# Patient Record
Sex: Male | Born: 1955 | Race: White | Hispanic: No | Marital: Married | Smoking: Current every day smoker
Health system: Southern US, Community
[De-identification: ages and names within clinical notes are randomized; demographics above are authoritative.]

## PROBLEM LIST (undated history)

## (undated) DIAGNOSIS — M79609 Pain in unspecified limb: Secondary | ICD-10-CM

## (undated) DIAGNOSIS — Z8719 Personal history of other diseases of the digestive system: Secondary | ICD-10-CM

## (undated) DIAGNOSIS — T7840XA Allergy, unspecified, initial encounter: Secondary | ICD-10-CM

## (undated) DIAGNOSIS — J309 Allergic rhinitis, unspecified: Secondary | ICD-10-CM

## (undated) DIAGNOSIS — M1990 Unspecified osteoarthritis, unspecified site: Secondary | ICD-10-CM

## (undated) DIAGNOSIS — E119 Type 2 diabetes mellitus without complications: Secondary | ICD-10-CM

## (undated) DIAGNOSIS — F17200 Nicotine dependence, unspecified, uncomplicated: Secondary | ICD-10-CM

## (undated) DIAGNOSIS — K219 Gastro-esophageal reflux disease without esophagitis: Secondary | ICD-10-CM

## (undated) DIAGNOSIS — J329 Chronic sinusitis, unspecified: Secondary | ICD-10-CM

## (undated) DIAGNOSIS — C61 Malignant neoplasm of prostate: Secondary | ICD-10-CM

## (undated) HISTORY — PX: FINGER SURGERY: SHX640

## (undated) HISTORY — DX: Type 2 diabetes mellitus without complications: E11.9

## (undated) HISTORY — PX: SPINE SURGERY: SHX786

## (undated) HISTORY — DX: Allergy, unspecified, initial encounter: T78.40XA

## (undated) HISTORY — DX: Nicotine dependence, unspecified, uncomplicated: F17.200

## (undated) HISTORY — PX: PROSTATE BIOPSY: SHX241

## (undated) HISTORY — PX: COLONOSCOPY: SHX174

## (undated) HISTORY — DX: Chronic sinusitis, unspecified: J32.9

## (undated) HISTORY — DX: Pain in unspecified limb: M79.609

## (undated) HISTORY — PX: STOMACH SURGERY: SHX791

## (undated) HISTORY — DX: Gastro-esophageal reflux disease without esophagitis: K21.9

## (undated) HISTORY — DX: Allergic rhinitis, unspecified: J30.9

## (undated) HISTORY — DX: Unspecified osteoarthritis, unspecified site: M19.90

## (undated) HISTORY — DX: Personal history of other diseases of the digestive system: Z87.19

---

## 2003-07-12 LAB — HM COLONOSCOPY

## 2009-05-20 ENCOUNTER — Ambulatory Visit: Payer: Self-pay | Admitting: Internal Medicine

## 2009-05-20 DIAGNOSIS — Z8719 Personal history of other diseases of the digestive system: Secondary | ICD-10-CM | POA: Insufficient documentation

## 2009-05-20 DIAGNOSIS — J309 Allergic rhinitis, unspecified: Secondary | ICD-10-CM

## 2009-05-20 DIAGNOSIS — Z860100 Personal history of colon polyps, unspecified: Secondary | ICD-10-CM

## 2009-05-20 DIAGNOSIS — K5732 Diverticulitis of large intestine without perforation or abscess without bleeding: Secondary | ICD-10-CM

## 2009-05-20 DIAGNOSIS — M79609 Pain in unspecified limb: Secondary | ICD-10-CM | POA: Insufficient documentation

## 2009-05-20 DIAGNOSIS — J329 Chronic sinusitis, unspecified: Secondary | ICD-10-CM | POA: Insufficient documentation

## 2009-05-20 DIAGNOSIS — F17200 Nicotine dependence, unspecified, uncomplicated: Secondary | ICD-10-CM | POA: Insufficient documentation

## 2009-05-20 DIAGNOSIS — K219 Gastro-esophageal reflux disease without esophagitis: Secondary | ICD-10-CM | POA: Insufficient documentation

## 2009-05-20 HISTORY — DX: Allergic rhinitis, unspecified: J30.9

## 2009-05-20 HISTORY — DX: Personal history of colonic polyps: Z86.010

## 2009-05-20 HISTORY — DX: Personal history of colon polyps, unspecified: Z86.0100

## 2009-05-20 HISTORY — DX: Diverticulitis of large intestine without perforation or abscess without bleeding: K57.32

## 2009-05-20 HISTORY — DX: Chronic sinusitis, unspecified: J32.9

## 2009-06-17 ENCOUNTER — Ambulatory Visit: Payer: Self-pay | Admitting: Internal Medicine

## 2009-07-14 ENCOUNTER — Ambulatory Visit: Payer: Self-pay | Admitting: Cardiovascular Disease

## 2009-07-16 ENCOUNTER — Telehealth: Payer: Self-pay | Admitting: Internal Medicine

## 2009-07-27 ENCOUNTER — Encounter: Payer: Self-pay | Admitting: Internal Medicine

## 2009-08-04 ENCOUNTER — Encounter (INDEPENDENT_AMBULATORY_CARE_PROVIDER_SITE_OTHER): Payer: Self-pay | Admitting: *Deleted

## 2009-09-07 ENCOUNTER — Encounter (INDEPENDENT_AMBULATORY_CARE_PROVIDER_SITE_OTHER): Payer: Self-pay | Admitting: *Deleted

## 2009-09-09 ENCOUNTER — Ambulatory Visit: Payer: Self-pay | Admitting: Gastroenterology

## 2009-09-23 ENCOUNTER — Ambulatory Visit: Payer: Self-pay | Admitting: Gastroenterology

## 2009-09-25 ENCOUNTER — Encounter: Payer: Self-pay | Admitting: Gastroenterology

## 2010-08-08 LAB — CONVERTED CEMR LAB
ALT: 18 U/L
ALT: 32 U/L
AST: 18 U/L
AST: 24 U/L
Albumin: 4 g/dL
Albumin: 4.5 g/dL
Alkaline Phosphatase: 47 U/L
Alkaline Phosphatase: 59 U/L
BUN: 11 mg/dL
BUN: 19 mg/dL
Basophils Absolute: 0 K/uL
Basophils Relative: 0.4 %
Basophils Relative: 0.6 %
Bilirubin Urine: NEGATIVE
Bilirubin Urine: NEGATIVE
Bilirubin, Direct: 0.2 mg/dL
CO2: 30 meq/L
CO2: 30 meq/L
Calcium: 8.9 mg/dL
Calcium: 9.3 mg/dL
Chloride: 101 meq/L
Chloride: 106 meq/L
Cholesterol: 164 mg/dL
Creatinine, Ser: 1 mg/dL
Creatinine, Ser: 1 mg/dL
Eosinophils Absolute: 0.1 K/uL
Eosinophils Relative: 0.2 %
Eosinophils Relative: 1.3 %
GFR calc non Af Amer: 83.08 mL/min
Glucose, Bld: 114 mg/dL — ABNORMAL HIGH
Glucose, Bld: 130 mg/dL
Glucose, Urine, Semiquant: NEGATIVE
HCT: 35.4 %
HCT: 42.1 %
HDL: 28.2 mg/dL — ABNORMAL LOW
Hemoglobin, Urine: NEGATIVE
Hemoglobin: 11.5 g/dL
Hemoglobin: 13.2 g/dL
Ketones, ur: NEGATIVE mg/dL
Ketones, urine, test strip: NEGATIVE
LDL Cholesterol: 107 mg/dL — ABNORMAL HIGH
Leukocytes, UA: NEGATIVE
Lymphocytes Relative: 29.8 %
Lymphocytes, automated: 16.1 %
Lymphs Abs: 2 K/uL
MCHC: 31.4 g/dL
MCV: 61.9 fL
MCV: 65.6 fL — ABNORMAL LOW
Monocytes Absolute: 0.5 K/uL
Monocytes Relative: 7.1 %
Monocytes Relative: 7.2 %
Neutro Abs: 4.2 K/uL
Neutrophils Relative %: 61.2 %
Neutrophils Relative %: 76.1 %
Nitrite: NEGATIVE
Nitrite: NEGATIVE
PSA: 1.07 ng/mL
Platelets: 192 K/uL
Platelets: 209 K/uL
Potassium: 3.8 meq/L
Potassium: 4.4 meq/L
Protein, U semiquant: 30
RBC: 5.72 M/uL
RBC: 6.42 M/uL — ABNORMAL HIGH
RDW: 15.4 % — ABNORMAL HIGH
RDW: 16 %
Sodium: 139 meq/L
Sodium: 139 meq/L
Specific Gravity, Urine: 1.015
Specific Gravity, Urine: 1.025
TSH: 0.68 u[IU]/mL
Total Bilirubin: 0.9 mg/dL
Total Bilirubin: 1 mg/dL
Total CHOL/HDL Ratio: 6
Total Protein, Urine: NEGATIVE mg/dL
Total Protein: 6.4 g/dL
Total Protein: 7.5 g/dL
Triglycerides: 144 mg/dL
Urine Glucose: NEGATIVE mg/dL
Urobilinogen, UA: 0.2
Urobilinogen, UA: 1
VLDL: 28.8 mg/dL
WBC: 6.5 10*3/microliter
WBC: 6.8 10*3/microliter
pH: 5
pH: 7.5

## 2010-08-10 NOTE — Miscellaneous (Signed)
Summary: [facility]  Clinical Lists Changes  Medications: Added new medication of MOVIPREP 100 GM  SOLR (PEG-KCL-NACL-NASULF-NA ASC-C) As per prep instructions. - Signed Rx of MOVIPREP 100 GM  SOLR (PEG-KCL-NACL-NASULF-NA ASC-C) As per prep instructions.;  #1 x 0;  Signed;  Entered by: Ezra Sites RN;  Authorized by: Rachael Fee MD;  Method used: Electronically to [facility] [address], [place], [place], [place]  [address], Ph: [phone], Fax: [phone] Observations: Added new observation of NKA: T (09/09/2009 8:17)    Prescriptions: MOVIPREP 100 GM  SOLR (PEG-KCL-NACL-NASULF-NA ASC-C) As per prep instructions.  #1 x 0   Entered by:   Ezra Sites RN   Authorized by:   Rachael Fee MD   Signed by:   Ezra Sites RN on 09/09/2009   Method used:   Electronically to        [facility] (retail)       [address]       [place], [place]  [address]       Ph: [phone]       Fax: [phone]   RxID:   [id number]

## 2010-08-10 NOTE — Letter (Signed)
Summary: Results Letter   Gastroenterology  [address] [place], [place] [address]   Phone: [phone]  Fax: [phone]        September 25, 2009 MRN: [id number]    AUTHUR CUBIT [address] [place], [place]  [address]    Dear Mr. Sofranko,   Two of the polyps (out of three) removed during your recent procedure was proven to be adenomatous.  These are pre-cancerous polyps that may have grown into cancers if they had not been removed.  Based on current nationally recognized surveillance guidelines, I recommend that you have a repeat colonoscopy in 5 years.  We will therefore put your information in our reminder system and will contact you in 5 years to schedule a repeat procedure.  Please call if you have any questions or concerns.        Sincerely,  Rachael Fee MD  This letter has been electronically signed by your physician.  Appended Document: Results Letter Letter mailed 3.21.11

## 2010-08-10 NOTE — Progress Notes (Signed)
  Phone Note Call from Patient Call back at Home Phone [phone]   Caller: Patient Call For: Newt Lukes MD Summary of Call: Pt requesting medicine for draining sinuses.Please advise. Initial call taken by: Verdell Face,  July 16, 2009 8:54 AM  Follow-up for Phone Call        per MD, pt advised to take Benadryl with Claritin and call back tomorrow if no improvement Follow-up by: Margaret Pyle, CMA,  July 16, 2009 9:21 AM

## 2010-08-10 NOTE — Procedures (Signed)
Summary: Colonoscopy  Patient: [name] Note: All result statuses are Final unless otherwise noted.  Tests: (1) Colonoscopy (COL)   COL Colonoscopy           DONE     [facility]     [address]     [place], [place]  [address]           COLONOSCOPY PROCEDURE REPORT           PATIENT:  Alec [name], Alec [name]  MR#:  [id number]     BIRTHDATE:  Jan 11, 1956, 53 yrs. old  GENDER:  male           ENDOSCOPIST:  Rachael Fee, MD     Referred by:  Rene Paci, M.D.           PROCEDURE DATE:  09/23/2009     PROCEDURE:  Colonoscopy with snare polypectomy     ASA CLASS:  Class II     INDICATIONS:  previous polyps (unclear pathology) on colonoscopy     5-6 years ago, screening           MEDICATIONS:   Fentanyl 100 mcg IV, Versed 10 mg IV           DESCRIPTION OF PROCEDURE:   After the risks benefits and     alternatives of the procedure were thoroughly explained, informed     consent was obtained.  Digital rectal exam was performed and     revealed no rectal masses.   The LB CF-[id number] endoscope     was introduced through the anus and advanced to the cecum, which     was identified by both the appendix and ileocecal valve, without     limitations.  The quality of the prep was excellent, using     MoviPrep.  The instrument was then slowly withdrawn as the colon     was fully examined.     <<PROCEDUREIMAGES>>           FINDINGS:  A total of three small, sessile polyps were found,     removed with cold snare and sent to pathology (jar 1). Two were     located in ascending colon, one was located in sigmoid. All were     2-24mm across (see image4 and image5).  Moderate diverticulosis was     found sigmoid to descending colon segments. There was associated     mucosal edema and thickening (see image1).  This was otherwise a     normal examination of the colon (see image2, image3, and image6).     Retroflexed views in the rectum revealed no abnormalities.    The    scope was then withdrawn from the patient and the procedure     completed.           COMPLICATIONS:  None           ENDOSCOPIC IMPRESSION:     1) Three small polyps, all removed and sent to pathology     2) Moderate diverticulosis in the sigmoid to descending colon     segments     3) Otherwise normal examination     RECOMMENDATIONS:     1) If the polyp(s) removed today are proven to be adenomatous     (pre-cancerous) polyps, you will need a colonoscopy in 3-5 years.     Otherwise you should continue to follow colorectal cancer     screening guidelines for "routine risk" patients with a  colonoscopy in 10 years.     2) You will receive a letter within 1-2 weeks with the results     of your biopsy as well as final recommendations. Please call my     office if you have not received a letter after 3 weeks.           REPEAT EXAM:  await pathology           ______________________________     Rachael Fee, MD           n.     eSIGNED:   Rachael Fee at 09/23/2009 08:52 AM           Valora Piccolo, [id number]  Note: An exclamation mark (!) indicates a result that was not dispersed into the flowsheet. Document Creation Date: 09/23/2009 8:52 AM _______________________________________________________________________  (1) Order result status: Final Collection or observation date-time: 09/23/2009 08:42 Requested date-time:  Receipt date-time:  Reported date-time:  Referring Physician:   Ordering Physician: Rob Bunting [phone]) Specimen Source:  Source: Launa Grill Order Number: [id number] Lab site:   Appended Document: Colonoscopy     Procedures Next Due Date:    Colonoscopy: 09/2014

## 2010-08-10 NOTE — Letter (Signed)
Summary: [facility] Instructions  [facility]  [address] [place], [place] [address]   Phone: [phone]  Fax: [phone]       WAYBURN SHALER    01/07/1956    MRN: [id number]        Procedure Day Dorna Bloom: Wednesday 09/23/2009     Arrival Time: 7:30 am      Procedure Time: 8:30 am     Location of Procedure:                    _x _  [facility] (4th Floor)                        PREPARATION FOR COLONOSCOPY WITH MOVIPREP   Starting 5 days prior to your procedure Friday 3/11 do not eat nuts, seeds, popcorn, corn, beans, peas,  salads, or any raw vegetables.  Do not take any fiber supplements (e.g. Metamucil, Citrucel, and Benefiber).  THE DAY BEFORE YOUR PROCEDURE         DATE: Tuesday 3/15  1.  Drink clear liquids the entire day-NO SOLID FOOD  2.  Do not drink anything colored red or purple.  Avoid juices with pulp.  No orange juice.  3.  Drink at least 64 oz. (8 glasses) of fluid/clear liquids during the day to prevent dehydration and help the prep work efficiently.  CLEAR LIQUIDS INCLUDE: Water Jello Ice Popsicles Tea (sugar ok, no milk/cream) Powdered fruit flavored drinks Coffee (sugar ok, no milk/cream) Gatorade Juice: apple, white grape, white cranberry  Lemonade Clear bullion, consomm, broth Carbonated beverages (any kind) Strained chicken noodle soup Hard Candy                             4.  In the morning, mix first dose of MoviPrep solution:    Empty 1 Pouch A and 1 Pouch B into the disposable container    Add lukewarm drinking water to the top line of the container. Mix to dissolve    Refrigerate (mixed solution should be used within 24 hrs)  5.  Begin drinking the prep at 5:00 p.m. The MoviPrep container is divided by 4 marks.   Every 15 minutes drink the solution down to the next mark (approximately 8 oz) until the full liter is complete.   6.  Follow completed prep with 16 oz of clear liquid of your choice  (Nothing red or purple).  Continue to drink clear liquids until bedtime.  7.  Before going to bed, mix second dose of MoviPrep solution:    Empty 1 Pouch A and 1 Pouch B into the disposable container    Add lukewarm drinking water to the top line of the container. Mix to dissolve    Refrigerate  THE DAY OF YOUR PROCEDURE      DATE: Wednesday 3/16  Beginning at 3:30 a.m. (5 hours before procedure):         1. Every 15 minutes, drink the solution down to the next mark (approx 8 oz) until the full liter is complete.  2. Follow completed prep with 16 oz. of clear liquid of your choice.    3. You may drink clear liquids until 6:30 am (2 HOURS BEFORE PROCEDURE).   MEDICATION INSTRUCTIONS  Unless otherwise instructed, you should take regular prescription medications with a small sip of water   as early as possible the morning of your  procedure.           OTHER INSTRUCTIONS  You will need a responsible adult at least 55 years of age to accompany you and drive you home.   This person must remain in the waiting room during your procedure.  Wear loose fitting clothing that is easily removed.  Leave jewelry and other valuables at home.  However, you may wish to bring a book to read or  an iPod/MP3 player to listen to music as you wait for your procedure to start.  Remove all body piercing jewelry and leave at home.  Total time from sign-in until discharge is approximately 2-3 hours.  You should go home directly after your procedure and rest.  You can resume normal activities the  day after your procedure.  The day of your procedure you should not:   Drive   Make legal decisions   Operate machinery   Drink alcohol   Return to work  You will receive specific instructions about eating, activities and medications before you leave.    The above instructions have been reviewed and explained to me by   Ezra Sites RN  September 09, 2009 8:43 AM     I fully understand  and can verbalize these instructions _____________________________ Date _________

## 2010-08-10 NOTE — Letter (Signed)
Summary: Previsit letter  [facility]  [address] [place], [place] [address]   Phone: [phone]  Fax: [phone]       08/04/2009 MRN: [id number]  Alec [name] [address] [place], [place]  [address]  Dear Mr. Karl,  Welcome to the Gastroenterology Division at [facility].    You are scheduled to see a nurse for your pre-procedure visit on September 09, 2009 at 8:30am on the 3rd floor at [facility], [address]  We ask that you try to arrive at our office 15 minutes prior to your appointment time to allow for check-in.  Your nurse visit will consist of discussing your medical and surgical history, your immediate family medical history, and your medications.    Please bring a complete list of all your medications or, if you prefer, bring the medication bottles and we will list them.  We will need to be aware of both prescribed and over the counter drugs.  We will need to know exact dosage information as well.  If you are on blood thinners (Coumadin, Plavix, Aggrenox, Ticlid, etc.) please call our office today/prior to your appointment, as we need to consult with your physician about holding your medication.   Please be prepared to read and sign documents such as consent forms, a financial agreement, and acknowledgement forms.  If necessary, and with your consent, a friend or relative is welcome to sit-in on the nurse visit with you.  Please bring your insurance card so that we may make a copy of it.  If your insurance requires a referral to see a specialist, please bring your referral form from your primary care physician.  No co-pay is required for this nurse visit.     If you cannot keep your appointment, please call [phone] to cancel or reschedule prior to your appointment date.  This allows [place] the opportunity to schedule an appointment for another patient in need of care.    Thank you for choosing [facility] for  your medical needs.  We appreciate the opportunity to care for you.  Please visit [place] at our website  to learn more about our practice.                     Sincerely.                                                                                                                   The Gastroenterology Division

## 2010-08-10 NOTE — Assessment & Plan Note (Signed)
Summary: NEW [employer] PT--L LEG PAIN-PKG/OFF---STC   Vital Signs:  Patient profile:   55 year old male Height:      70 inches (177.80 cm) Weight:      205.0 pounds (93.18 kg) BMI:     29.52 O2 Sat:      97 % Temp:     98.4 degrees F (36.89 degrees C) oral Pulse rate:   83 / minute BP sitting:   112 / 82  (left arm) Cuff size:   large  Vitals Entered By: Orlan Leavens (May 20, 2009 8:38 AM) CC: New patient Is Patient Diabetic? No Pain Assessment Patient in pain? no        Primary Care Provider:  Newt Lukes MD  CC:  New patient.  History of Present Illness: new pt here to est care - prev followed with dr. Sherril Croon in [place]  patient is here today for annual physical. Patient feels well today overall.   also here today with complaint of left leg numbness and ache . onset of symptoms was 6 mos ago, but worse 2-3 months ago. course has been gradual onset, inititally intermittent and now occurs in constant pattern. problem precipitated by prolonged periods of sitting/driving symptom characterized as "throbbing" or "numb tingling" symptom radiates from knee down to toes "all over lower leg" problem not associated with injury or back/hip/knee pain, no swelling . symptoms improved by rest/reclining such as sleep overnight. symptoms worsened with activity. no prior hx of same symptoms.   also with chronic nasal congestion and discharge (clear, thick) no sinus pain or HA - uses Vicks spray at bedtime which helps but ? other product/med   Preventive Screening-Counseling & Management  Alcohol-Tobacco     Alcohol drinks/day: 0     Alcohol Counseling: not indicated; patient does not drink     Smoking Status: current     Smoking Cessation Counseling: yes     Packs/Day: 1.0     Tobacco Counseling: to quit use of tobacco products  Caffeine-Diet-Exercise     Caffeine use/day: <1     Caffeine Counseling: not indicated; caffeine use is not excessive or problematic      Does Patient Exercise: yes     Exercise Counseling: not indicated; exercise is adequate     Depression Counseling: not indicated; screening negative for depression  Clinical Review Panels:  Prevention   Last Colonoscopy:  Done by Dr. Orlando Penner in [place] Results: Polyp (07/12/2003)  Immunizations   Last Tetanus Booster:  Historical (07/11/2001)  CBC   WBC:  6.5 (01/27/2007)   RBC:  5.72 (01/27/2007)   Hgb:  11.5 (01/27/2007)   Hct:  35.4 (01/27/2007)   Platelets:  192 (01/27/2007)   MCV  61.9 (01/27/2007)   RDW  16.0 (01/27/2007)   PMN:  76.1 (01/27/2007)   Monos:  7.2 (01/27/2007)   Eosinophils:  0.2 (01/27/2007)   Basophil:  0.4 (01/27/2007)  Complete Metabolic Panel   Glucose:  130 (01/27/2007)   Sodium:  139 (01/27/2007)   Potassium:  3.8 (01/27/2007)   Chloride:  106 (01/27/2007)   CO2:  30 (01/27/2007)   BUN:  19 (01/27/2007)   Creatinine:  1.00 (01/27/2007)   Albumin:  4.0 (01/27/2007)   Total Protein:  6.4 (01/27/2007)   Calcium:  8.9 (01/27/2007)   Total Bili:  0.9 (01/27/2007)   Alk Phos:  47 (01/27/2007)   SGPT (ALT):  18 (01/27/2007)   SGOT (AST):  18 (01/27/2007)   -  Date:  01/27/2007  BG Random: 130    BUN: 19    Creatinine: 1.00    Sodium: 139    Potassium: 3.8    Chloride: 106    CO2 Total: 30    SGOT (AST): 18    SGPT (ALT): 18    T. Bilirubin: 0.9    Alk Phos: 47    Calcium: 8.9    Total Protein: 6.4    Albumin: 4.0    WBC: 6.5    HGB: 11.5    HCT: 35.4    RBC: 5.72    PLT: 192    MCV: 61.9    RDW: 16.0    Neutrophil: 76.1    Lymphs: 16.1    Monos: 7.2    Eos: 0.2    Basophil: 0.4    Specific Gravity: 1.015    Appearance: clear    Nitrite: neg    PH: 7.5    Protein: 30    Glucose: neg    Ketones: neg    Urobilinogen: 1.0    Bilirubin: neg  Current Medications (verified): 1)  Zyrtec-D Allergy & Congestion 5-120 Mg Xr12h-Tab (Cetirizine-Pseudoephedrine) .... Take 1 By Mouth Once Daily As Needed  2)  Tylenol Extra Strength 500 Mg Tabs (Acetaminophen) .... Take Prn  Allergies (verified): No Known Drug Allergies  Past History:  Past Medical History: Diverticulitis, hx of Allergic rhinitis Colonic polyps, hx of GERD anemia, hx of  Past Surgical History: none  Family History: Family History of Alcoholism/Addiction (brother) Heart disease & Parkinson disease (father) Alzheimer disease (mother)  Social History: Current Smoker no alcohol use married, lives with wife (who works at [employer]) works as Ecologist Status:  current Caffeine use/day:  <1 Does Patient Exercise:  yes Packs/Day:  1.0  Review of Systems       see HPI above. I have reviewed all other systems and they were negative.   Physical Exam  General:  alert, well-developed, well-nourished, and cooperative to examination.    Eyes:  vision grossly intact; pupils equal, round and reactive to light.  conjunctiva and lids normal.   Ears:  normal pinnae bilaterally, without erythema, swelling, or tenderness to palpation. TMs clear, without effusion, or cerumen impaction. Hearing grossly normal bilaterally  Nose:  no external deformity, no nasal discharge, and mucosal erythema.   Mouth:  teeth and gums in good repair; mucous membranes moist, without lesions or ulcers. oropharynx clear without exudate, + erythema.  Lungs:  normal respiratory effort, no intercostal retractions or use of accessory muscles; normal breath sounds bilaterally - no crackles and no wheezes.    Heart:  normal rate, regular rhythm, no murmur, and no rub. BLE without edema. normal DP pulses and normal cap refill in all 4 extremities    Abdomen:  soft, non-tender, normal bowel sounds, no distention; no masses and no appreciable hepatomegaly or splenomegaly.   Genitalia:  defer  Msk:  back: full range of motion of lumbar spine. Nontender to palpation. Negative straight leg raise. Deep tendon reflexes symmetrically intact at Achilles and patella, negative clonus. Sensation intact throughout all dermatomes in bilateral lower extremities. Full strength to manual muscle testing in all major muscule groups including EHL, anterior tibialis, gastrocnemius, quadriceps, and iliopsoas. Able to heel and toe walk without difficulty and ambulates with a normal gait.  Neurologic:  alert & oriented X3 and cranial nerves II-XII symetrically intact.  strength normal in all extremities, sensation intact to light touch, and gait normal. speech fluent without dysarthria or  aphasia; follows commands with good comprehension.  Skin:  no rashes, vesicles, ulcers, or erythema. No nodules or irregularity to palpation.  Psych:  Oriented X3, memory intact for recent and remote, normally interactive, good eye contact, not anxious appearing, not depressed appearing, and not agitated.      Impression & Recommendations:  Problem # 1:  PREVENTIVE HEALTH CARE (ICD-V70.0)  Patient has been counseled on age-appropriate routine health concerns for screening and prevention. These are reviewed and up-to-date. Immunizations are up-to-date or declined. Labs and ECG reviewed.   Orders: TLB-Lipid Panel (80061-LIPID) TLB-BMP (Basic Metabolic Panel-BMET) (80048-METABOL) TLB-CBC Platelet - w/Differential (85025-CBCD) TLB-Hepatic/Liver Function Pnl (80076-HEPATIC) TLB-TSH (Thyroid Stimulating Hormone) (84443-TSH) TLB-PSA (Prostate Specific Antigen) (84153-PSA) TLB-Udip ONLY (81003-UDIP) EKG w/ Interpretation (93000) Gastroenterology Referral (GI)  Problem # 2:  LEG PAIN, LEFT (ICD-729.5) suspect coming from back - but exam benign check xray l-spine and tx with 6d pred pack -  f/u if unimproved and consider MRI or other referral as needed  Orders: T-Lumbar Spine Complete, 5 Views (71110TC)   Problem # 3:  SINUSITIS, CHRONIC (ICD-473.9) add nasal steroids and stop smoking!  His updated medication list for this problem includes:    Zyrtec-d Allergy & Congestion 5-120 Mg Xr12h-tab (Cetirizine-pseudoephedrine) .Marland Kitchen... Take 1 by mouth once daily as needed    Fluticasone Propionate 50 Mcg/act Susp (Fluticasone propionate) .Marland Kitchen... 1 spray each nostril  every morning  Problem # 4:  SMOKER (ICD-305.1) 5 minutes today spent on patient education regarding the unhealthy effects of continued tobacco abuse and encouragment of cessation including medical options available to help patient to quit smoking. already has chantix rx - encouraged its use!!!  Complete Medication List: 1)  Zyrtec-d Allergy & Congestion 5-120 Mg Xr12h-tab (Cetirizine-pseudoephedrine) .... Take 1 by mouth once daily as needed 2)  Tylenol Extra Strength 500 Mg Tabs (Acetaminophen) .... Take prn 3)  Prednisone (pak) 10 Mg Tabs (Prednisone) .... Take as directed x 6 days 4)  Fluticasone Propionate 50 Mcg/act Susp (Fluticasone propionate) .Marland Kitchen.. 1 spray each nostril  every morning  Patient Instructions: 1)  it was good to see you today.  2)  we have done a physical today - your exam and EKG are normal - 3)  labwork today - your results will be posted on the phone tree for review in next 48-72 hours.  4)  we'll make referral to GI for colonoscopy . Our office will contact you regarding this appointment once made.  5)  for your leg pain, we will check xray of low back to look for arthritis changes - we also will treat with pred pack for antinflammatory effect - let us know if this does not help your pain for further evaluation and testing. 6)  for your sinus problems, use flonase spray every morning - ok to still use Vicks or other nose spray at bedtime as needed  - also STOP smoking!  7)  Tobacco is very bad for your health and your loved ones! You Should stop smoking! use the chantix you have and call if refills are needed for continuing dose pack 8)  Please schedule a follow-up appointment in 4-6 weeks for your leg/back to monitor the progress of your sinus sy and smoking cessation, call sooner if problems.  Prescriptions: FLUTICASONE PROPIONATE 50 MCG/ACT SUSP (FLUTICASONE PROPIONATE) 1 spray each nostril  every morning  #1 x 3   Entered and Authorized by:   Newt Lukes MD   Signed by:   Newt Lukes MD  on 05/20/2009   Method used:   Electronically to        [facility] (retail)       [address]       [place], [place]  [address]       Ph: [phone]       Fax: [phone]   RxID:   [id number] PREDNISONE (PAK) 10 MG TABS (PREDNISONE) take as directed x 6 days  #1 x 0   Entered and Authorized by:   Newt Lukes MD   Signed by:   Newt Lukes MD on 05/20/2009   Method used:   Electronically to        [facility] (retail)       [address]       [place], [place]  [address]       Ph: [phone]       Fax: [phone]   RxID:   [id number]    Immunization History:  Tetanus/Td Immunization History:    Tetanus/Td:  historical (07/11/2001)    Colonoscopy  Procedure date:  07/12/2003  Findings:      Done by Dr. Orlando Penner in [place] Results: Polyp  Venous Doppler  Procedure date:  03/23/2009  Findings:      Done @ [facility] Impression: 1. No evidence of deep venous thombosis left lower extremity  CT of Abdomen  Procedure date:  04/24/2008  Findings:      CT Abdomen Finding: Solid organ have an unremarkable unenhanced appearance. No renal or proximal ureteral stones. No hydronephrosis. Gallbladder unremarkable.  Bowel grossly unremarkable. No free fluid, free air, or adenopathy.   Lung bases are clear. No effusion. No acute bony abnormality. Mild degenerative changes in the spine.  Impression: No acute finding in the abdomen  CT of Pelvis  Procedure date:  04/24/2008  Findings:      Finds: There is sigmoid diverticulosis. inflammatory stranding noted around portions of the sigmoid colon best seen on the coronal reconstructed images, compatible with mild changes of diverticulitis. no free air focal or fluid collection to suggest abscess.  Pelvic small bowel grossly unremarkable. Appendix is normal. Nofree fluid, free air, or adenopathy. Urinary bladder unremarkable. No acute abony abnormality.  Impression: Sigmoid diverticulosis with early changes of active diverticuilitis  Imaging Exam  Procedure date:  01/27/2007  Findings:      Rad/Acute Abdomen series Impression: 1. Nonspecific bowel gas pattern. No evidence of acute cardiopulmonary disease

## 2010-08-10 NOTE — Consult Note (Signed)
Summary: Sinus symptoms & chronic/[facility]  Sinus symptoms & chronic/[facility]   Imported By: Sherian Rein 07/29/2009 10:46:16  _____________________________________________________________________  External Attachment:    Type:   Image     Comment:   External Document

## 2011-04-04 ENCOUNTER — Ambulatory Visit (INDEPENDENT_AMBULATORY_CARE_PROVIDER_SITE_OTHER): Payer: 59 | Admitting: Internal Medicine

## 2011-04-04 ENCOUNTER — Encounter: Payer: Self-pay | Admitting: Internal Medicine

## 2011-04-04 DIAGNOSIS — R350 Frequency of micturition: Secondary | ICD-10-CM

## 2011-04-04 DIAGNOSIS — R109 Unspecified abdominal pain: Secondary | ICD-10-CM

## 2011-04-04 DIAGNOSIS — Z860100 Personal history of colon polyps, unspecified: Secondary | ICD-10-CM

## 2011-04-04 LAB — POCT URINALYSIS DIPSTICK
Bilirubin, UA: NEGATIVE
Glucose, UA: NEGATIVE
Ketones, UA: NEGATIVE
Nitrite, UA: NEGATIVE
Protein, UA: NEGATIVE
Spec Grav, UA: 1.002
Urobilinogen, UA: 0.2
pH, UA: 5

## 2011-04-04 MED ORDER — HYDROCODONE-ACETAMINOPHEN 5-500 MG PO TABS: 1.0000 | ORAL_TABLET | ORAL | Status: DC | PRN

## 2011-04-04 NOTE — Patient Instructions (Signed)
It was good to see you today. we'll make referral for CT scan to look for kidney stones. Our office will contact you regarding appointment(s) once made. Use vicodin as needed for pain and stay hydrated - Your prescription(s) have been submitted to your pharmacy. Please take as directed and contact our office if you believe you are having problem(s) with the medication(s). Schedule for your physical and labs as discussed in next few weeks-months

## 2011-04-04 NOTE — Progress Notes (Signed)
Subjective:    Patient ID: Alec [name], male    DOB: [birth year], 55 y.o.   MRN: [id number]  HPI complains of left flank pain Onset 3 days ago Pain raditates into buttock and posterior thigh Hx same on/off every few months for past 2 years, last flare 2 months ago and lasted x 2 weeks associated with increase urinary freq, esp nocturia No gross hematuria, no dysuria -  No falls, trauma or weakness in left leg Pain not imporved with tylenol, NSAIDs otc or heating pad Pain improved with standing - worse sitting or lying down  Past Medical History  Diagnosis Date  . ALLERGIC RHINITIS   . DIVERTICULITIS, HX OF   . GERD   . LEG PAIN, LEFT   . SINUSITIS, CHRONIC   . SMOKER      Review of Systems  Constitutional: Negative for fever and unexpected weight change.  Respiratory: Negative for cough and shortness of breath.   Cardiovascular: Negative for chest pain and palpitations.  Genitourinary: Positive for decreased urine volume. Negative for discharge and testicular pain.       Objective:   Physical Exam  BP 120/72  Pulse 68  Temp(Src) 97.8 F (36.6 C) (Oral)  Ht 5\' 7"  (1.702 m)  Wt 199 lb 6.4 oz (90.447 kg)  BMI 31.23 kg/m2  SpO2 98% Wt Readings from Last 3 Encounters:  04/04/11 199 lb 6.4 oz (90.447 kg)  06/17/09 206 lb (93.441 kg)  05/20/09 205 lb (92.987 kg)     Constitutional:  appears well-developed and well-nourished. No distress.  Neck: Normal range of motion. Neck supple. No JVD present. No thyromegaly present.  Cardiovascular: Normal rate, regular rhythm and normal heart sounds.  No murmur heard. no BLE edema Pulmonary/Chest: Effort normal and breath sounds normal. No respiratory distress. no wheezes.  Abdominal: Soft. Bowel sounds are normal. Patient exhibits no distension. There is no tenderness, no mass  Musculoskeletal: Back: full range of motion of thoracic and lumbar spine. Non tender to palpation. Negative straight leg raise. DTR's are  symmetrically intact. Sensation intact in all dermatomes of the lower extremities. Full strength to manual muscle testing and able to heel toe walk without difficulty and ambulates with normal gait.  Neurological: he is alert and oriented to person, place, and time. No cranial nerve deficit. Coordination/balance normal.  Skin: Skin is warm and dry.  No erythema or ulceration.  Psychiatric: he has a normal mood and affect. behavior is normal. Judgment and thought content normal.   Lab Results  Component Value Date   WBC 6.5 05/20/2009   WBC 6.8 05/20/2009   HGB 11.5 05/20/2009   HGB 13.2 05/20/2009   HCT 35.4 05/20/2009   HCT 42.1 05/20/2009   PLT 192 05/20/2009   PLT 209.0 05/20/2009   CHOL 164 05/20/2009   TRIG 144.0 05/20/2009   HDL 28.20* 05/20/2009   ALT 18 05/20/2009   ALT 32 05/20/2009   AST 18 05/20/2009   AST 24 05/20/2009   NA 139 05/20/2009   NA 139 05/20/2009   K 3.8 05/20/2009   K 4.4 05/20/2009   CL 106 05/20/2009   CL 101 05/20/2009   CREATININE 1.00 05/20/2009   CREATININE 1.0 05/20/2009   BUN 19 05/20/2009   BUN 11 05/20/2009   CO2 30 05/20/2009   CO2 30 05/20/2009   TSH 0.68 05/20/2009   PSA 1.07 05/20/2009   Udip today - mod blood, sm LE, no nitrate    Assessment & Plan:  Flank  pain, micro hematuria - ?kidney stone - check CT to eval for same and vicodin prn severe pain symptoms unrelieved with NSAIDs - if no evidence for kidney stone, consider MRI L-spine to look for SS or radiculopathy given intermittent recurrence of symptoms for last 2 years - prev relieved with pred pak in 2010

## 2011-04-06 ENCOUNTER — Ambulatory Visit (INDEPENDENT_AMBULATORY_CARE_PROVIDER_SITE_OTHER)
Admission: RE | Admit: 2011-04-06 | Discharge: 2011-04-06 | Disposition: A | Discharge status: Home / Self Care | Payer: 59 | Source: Ambulatory Visit | Attending: Internal Medicine | Admitting: Internal Medicine

## 2011-04-06 DIAGNOSIS — R350 Frequency of micturition: Secondary | ICD-10-CM

## 2011-04-06 DIAGNOSIS — R109 Unspecified abdominal pain: Secondary | ICD-10-CM

## 2011-04-13 ENCOUNTER — Telehealth: Payer: Self-pay

## 2011-04-13 MED ORDER — HYDROCORTISONE ACETATE 10 % RE FOAM: 1.0000 | Freq: Two times a day (BID) | RECTAL | Status: DC

## 2011-04-13 NOTE — Telephone Encounter (Signed)
Pt called stating he has been experiencing a flare of Hemorrhoids x 3 days. Pt is requesting Rx to local pharmacy, please advise, pt states he is having pain and discomfort with mild bleeding after using the restroom.

## 2011-04-13 NOTE — Telephone Encounter (Signed)
erx done

## 2011-04-13 NOTE — Telephone Encounter (Signed)
Pt informed of Rx and pharmacy 

## 2011-04-20 ENCOUNTER — Telehealth: Payer: Self-pay

## 2011-04-20 DIAGNOSIS — M5432 Sciatica, left side: Secondary | ICD-10-CM

## 2011-04-20 MED ORDER — PREDNISONE (PAK) 10 MG PO TABS: 10.0000 mg | ORAL_TABLET | ORAL | Status: AC

## 2011-04-20 NOTE — Telephone Encounter (Signed)
For treatment and eval of sciatica, erx for pred pak (has taken same with relief in past for other episodes) and will order MRI back to look for cause of sciatica - [facility] will arrange same and will call once results reviewed - thanks

## 2011-04-20 NOTE — Telephone Encounter (Signed)
Pt called stating he is experiencing LT side thigh pain, radiating down the entire leg. Pt does not believe pain is related to Kidney stone, per last OV. He is concerned that this could be related to Sciatic nerve, please advise?

## 2011-04-20 NOTE — Telephone Encounter (Signed)
Pt advised of Rx/pharmacy and MRI

## 2011-04-22 ENCOUNTER — Emergency Department (HOSPITAL_COMMUNITY)
Admission: EM | Admit: 2011-04-22 | Discharge: 2011-04-22 | Disposition: A | Discharge status: Home / Self Care | Payer: 59 | Attending: Emergency Medicine | Admitting: Emergency Medicine

## 2011-04-22 ENCOUNTER — Encounter (HOSPITAL_COMMUNITY): Payer: Self-pay | Admitting: *Deleted

## 2011-04-22 DIAGNOSIS — F17200 Nicotine dependence, unspecified, uncomplicated: Secondary | ICD-10-CM | POA: Insufficient documentation

## 2011-04-22 DIAGNOSIS — M79609 Pain in unspecified limb: Secondary | ICD-10-CM | POA: Insufficient documentation

## 2011-04-22 DIAGNOSIS — K219 Gastro-esophageal reflux disease without esophagitis: Secondary | ICD-10-CM | POA: Insufficient documentation

## 2011-04-22 DIAGNOSIS — M545 Low back pain, unspecified: Secondary | ICD-10-CM | POA: Insufficient documentation

## 2011-04-22 DIAGNOSIS — IMO0002 Reserved for concepts with insufficient information to code with codable children: Secondary | ICD-10-CM | POA: Insufficient documentation

## 2011-04-22 DIAGNOSIS — M5410 Radiculopathy, site unspecified: Secondary | ICD-10-CM

## 2011-04-22 MED ORDER — OXYCODONE-ACETAMINOPHEN 5-325 MG PO TABS: 1.0000 | ORAL_TABLET | ORAL | Status: AC | PRN

## 2011-04-22 NOTE — ED Notes (Signed)
Pt states intermittent kidney stone pain and pain to entire left leg. Dinies flank or back pain at this time. Pt states he recently seen a Chiropractor and was dx with a pinched nerve. Pt states pain was only to upper left leg and only had discomfort when sitting. Pt states now it is entire leg and hurts sitting or standing, but feels a little better while standing. Pt is scheduled for MRI on Monday.

## 2011-04-22 NOTE — ED Notes (Signed)
Pt c/o pain to left leg x 5 days; pt states he was diagnosed with a kidney stone x 2 weeks ago; pt is c/o severe pain when he sits down

## 2011-04-22 NOTE — ED Provider Notes (Signed)
History   This chart was scribed for Dr. Effie Shy by Clarita Crane. The patient was seen in room APA19/APA19 and the patient's care was started at 8:27AM.   CSN: [id number] Arrival date & time: 04/22/2011  7:31 AM  Chief Complaint  Patient presents with  . Left lower extremity pain    left leg     HPI Alec [name] is a 55 y.o. male who presents to the Emergency Department complaining of constant left thigh pain radiating to left calf onset 4 days ago and worsening since with associated lower back pain. Denies fever, cough, chest pain, SOB, n/v/d, numbness. Patient reports symptoms initially began 4 days ago after bending over to put a sock on with sudden onset of posterior left thigh pain but has gradually worsened since and began radiating to left calf yesterday. Patient reports current pain is aggravated by sitting and lying flat and mildly relieved by standing. Reports having significant relief while lying flat on stomach. Notes that he has experienced intermittent episodes of lower back pain with associated numbness and pain to left lower extremity for the past 4 to 5 years. Patient reports he was recently evaluated and treated by a chiropractor and has an MRI of his spine scheduled 3 days from today. Patient also reports he was recently diagnosed with a kidney stone confirmed by CT-abdomen performed 2 weeks ago. Patient is a current smoker. Currently on tapering dose of Prednisone.   Past Medical History  Diagnosis Date  . ALLERGIC RHINITIS   . DIVERTICULITIS, HX OF   . GERD   . LEG PAIN, LEFT   . SINUSITIS, CHRONIC   . SMOKER     History reviewed. No pertinent past surgical history.  Family History  Problem Relation Age of Onset  . Alzheimer's disease Mother   . Heart disease Father   . Parkinsonism Father   . Alcohol abuse Brother     History  Substance Use Topics  . Smoking status: Current Everyday Smoker  . Smokeless tobacco: Not on file   Comment: Married, lives  with wife (MM dx 2009)  . Alcohol Use: No      Review of Systems 10 Systems reviewed and are negative for acute change except as noted in the HPI.  Allergies  Review of patient's allergies indicates no known allergies.  Home Medications   Current Outpatient Rx  Name Route Sig Dispense Refill  . ACETAMINOPHEN 500 MG PO TABS Oral Take 500 mg by mouth as needed.      Marland Kitchen FLUTICASONE PROPIONATE 50 MCG/ACT NA SUSP Nasal Place 1 spray into the nose every morning.      Marland Kitchen HYDROCODONE-ACETAMINOPHEN 5-500 MG PO TABS Oral Take 1 tablet by mouth every 4 (four) hours as needed for pain. 20 tablet 0  . HYDROCORTISONE ACETATE 90 MG/DOSE RE FOAM Rectal Place 1 applicator rectally 2 (two) times daily. 15 g 0  . LORATADINE-PSEUDOEPHEDRINE 5-120 MG PO TB12 Oral Take 1 tablet by mouth every morning.      Marland Kitchen OMEPRAZOLE 20 MG PO CPDR Oral Take 20 mg by mouth daily.      . OXYCODONE-ACETAMINOPHEN 5-325 MG PO TABS Oral Take 1 tablet by mouth every 4 (four) hours as needed for pain. 20 tablet 0  . PREDNISONE (PAK) 10 MG PO TABS Oral Take 1 tablet (10 mg total) by mouth as directed. As directed x 6 days 21 tablet 0    BP 146/87  Pulse 89  Temp(Src) 98.1 F (36.7 C) (Oral)  Resp 20  Ht 5\' 10"  (1.778 m)  Wt 196 lb (88.905 kg)  BMI 28.12 kg/m2  SpO2 98%  Physical Exam  Nursing note and vitals reviewed. Constitutional: He is oriented to person, place, and time. He appears well-developed and well-nourished. No distress.  HENT:  Head: Normocephalic and atraumatic.  Mouth/Throat: Oropharynx is clear and moist.  Eyes: EOM are normal. Pupils are equal, round, and reactive to light.  Neck: Normal range of motion. Neck supple. No tracheal deviation present.  Cardiovascular: Normal rate, regular rhythm and normal heart sounds.  Exam reveals no gallop and no friction rub.   No murmur heard. Pulmonary/Chest: Effort normal and breath sounds normal. No respiratory distress. He has no wheezes. He has no rales. He  exhibits no tenderness.  Abdominal: Soft. He exhibits no distension. There is no tenderness. There is no CVA tenderness.  Musculoskeletal: Normal range of motion. He exhibits no edema.       Entire spine non-tender. No paraspinal tenderness. Negative straight leg raise to RLE. Mild positive straight leg raise on left at 45 degrees.   Neurological: He is alert and oriented to person, place, and time. He has normal reflexes. No sensory deficit.  Skin: Skin is warm and dry.  Psychiatric: He has a normal mood and affect. His behavior is normal.    ED Course  Procedures (including critical care time)  DIAGNOSTIC STUDIES: Oxygen Saturation is 98% on room air, normal by my interpretation.    COORDINATION OF CARE:    Labs Reviewed - No data to display No results found.   1. Radicular leg pain       MDM   Insidious onset of low back and left leg pain, currently under management as an outpatient, was scheduledadvanced imaging planned. No evidence for compressive myelopathy, occult infection, severe disability. Patient is stable for discharge with outpatient management.   I personally performed the services described in this documentation, which was scribed in my presence. The recorded information has been reviewed and considered. [name] L9:32 AM   Flint Melter, MD 04/22/11 [phone]

## 2011-04-23 ENCOUNTER — Ambulatory Visit
Admission: RE | Admit: 2011-04-23 | Discharge: 2011-04-23 | Disposition: A | Discharge status: Home / Self Care | Payer: 59 | Source: Ambulatory Visit | Attending: Internal Medicine | Admitting: Internal Medicine

## 2011-04-23 DIAGNOSIS — M5432 Sciatica, left side: Secondary | ICD-10-CM

## 2011-04-25 ENCOUNTER — Other Ambulatory Visit: Payer: 59

## 2011-04-25 ENCOUNTER — Other Ambulatory Visit: Payer: Self-pay | Admitting: Internal Medicine

## 2011-04-25 ENCOUNTER — Telehealth: Payer: Self-pay | Admitting: *Deleted

## 2011-04-25 DIAGNOSIS — M5416 Radiculopathy, lumbar region: Secondary | ICD-10-CM

## 2011-04-25 NOTE — Telephone Encounter (Signed)
Ok for NS referral - I will do

## 2011-04-25 NOTE — Telephone Encounter (Signed)
MRI does show possble pinched nerve  - if still with pain, he should consider seeing orthopedic or neurosurgury  Dahlia to d/w pt - does he have preference?

## 2011-04-25 NOTE — Progress Notes (Signed)
Addended by: Rene Paci A on: 04/25/2011 09:14 PM   Modules accepted: Orders

## 2011-04-25 NOTE — Telephone Encounter (Signed)
Patient requesting results of MRI.

## 2011-04-25 NOTE — Telephone Encounter (Signed)
Pt states that he is still having left pain and that he is having numbness in foot-pt does not have a preference

## 2011-05-09 ENCOUNTER — Telehealth: Payer: Self-pay | Admitting: *Deleted

## 2011-05-09 NOTE — Telephone Encounter (Signed)
Pt call stating trying to get appt schedule @ [facility]. There office been trying to get MRI that was done. Requesting MRI to be fax over. Fax MRI to [phone]...05/09/11@12 :10pm/LMB

## 2011-11-07 ENCOUNTER — Other Ambulatory Visit: Payer: Self-pay

## 2011-11-21 ENCOUNTER — Other Ambulatory Visit: Payer: Self-pay | Admitting: *Deleted

## 2011-11-21 MED ORDER — HYDROCORTISONE ACETATE 10 % RE FOAM: 1.0000 | Freq: Two times a day (BID) | RECTAL | Status: DC

## 2011-11-21 NOTE — Telephone Encounter (Signed)
proctocort - ok to send erx to any pharmacy near pt - thanks

## 2011-11-21 NOTE — Telephone Encounter (Signed)
Left msg on vm md rx something for hemorrhoids a year ago. Currently in [place]  hemorrhoids has flare back up requesting md to call rx into pharmacy... 5/13/2:pm/LMB

## 2011-11-21 NOTE — Telephone Encounter (Signed)
Notified pt md ok med want rx sent to [facility] in [place].... 11/21/11@3 :09pm/LMB

## 2011-11-22 ENCOUNTER — Other Ambulatory Visit: Payer: Self-pay

## 2011-11-22 ENCOUNTER — Telehealth: Payer: Self-pay | Admitting: *Deleted

## 2011-11-22 MED ORDER — HYDROCORTISONE ACE-PRAMOXINE 1-1 % RE FOAM: 1.0000 | Freq: Two times a day (BID) | RECTAL | Status: AC

## 2011-11-22 MED ORDER — HYDROCORTISONE ACETATE 10 % RE FOAM: 1.0000 | Freq: Two times a day (BID) | RECTAL | Status: DC

## 2011-11-22 NOTE — Telephone Encounter (Signed)
Pt called and stated [facility] didn't know what md sent in. Requesting [facility] [place]. [facility]form [facility] to [facility].... 11/22/11@9 :57am/LMB

## 2011-11-22 NOTE — Telephone Encounter (Signed)
Yes thanks 

## 2011-11-22 NOTE — Telephone Encounter (Signed)
Pharmacy called stating that they do recognize Rx sent for Proctofoam. Pharmacy is suggesting changing to Proctofoam %1 foam, apply rectally BID. Okay to change?

## 2011-11-28 ENCOUNTER — Telehealth: Payer: Self-pay | Admitting: *Deleted

## 2011-11-28 NOTE — Telephone Encounter (Signed)
Called pt to see if he pick rx up at [facility]. Pt states he did and doesn't need from [facility]. Called Kim back cx prescription that was sent in last week... 11/28/11@ 11:40am/LMB

## 2011-11-28 NOTE — Telephone Encounter (Signed)
Left msg on vm want to know if they can substitute proctoform rx for a generic.... 11/28/11@11 :32am/LMB

## 2012-02-13 ENCOUNTER — Encounter: Payer: Self-pay | Admitting: Internal Medicine

## 2012-02-13 ENCOUNTER — Ambulatory Visit (INDEPENDENT_AMBULATORY_CARE_PROVIDER_SITE_OTHER): Payer: 59 | Admitting: Internal Medicine

## 2012-02-13 VITALS — BP 130/70 | HR 74 | Temp 97.6°F | Ht 67.0 in | Wt 197.0 lb

## 2012-02-13 DIAGNOSIS — F17200 Nicotine dependence, unspecified, uncomplicated: Secondary | ICD-10-CM

## 2012-02-13 DIAGNOSIS — M79609 Pain in unspecified limb: Secondary | ICD-10-CM

## 2012-02-13 DIAGNOSIS — R197 Diarrhea, unspecified: Secondary | ICD-10-CM

## 2012-02-13 DIAGNOSIS — K5732 Diverticulitis of large intestine without perforation or abscess without bleeding: Secondary | ICD-10-CM

## 2012-02-13 DIAGNOSIS — K5792 Diverticulitis of intestine, part unspecified, without perforation or abscess without bleeding: Secondary | ICD-10-CM

## 2012-02-13 MED ORDER — CIPROFLOXACIN HCL 500 MG PO TABS: 500.0000 mg | ORAL_TABLET | Freq: Two times a day (BID) | ORAL | Status: AC

## 2012-02-13 MED ORDER — METRONIDAZOLE 500 MG PO TABS: 500.0000 mg | ORAL_TABLET | Freq: Three times a day (TID) | ORAL | Status: AC

## 2012-02-13 NOTE — Assessment & Plan Note (Signed)
5 minutes today spent counseling patient on unhealthy effects of continued tobacco abuse and encouragement of cessation including medical options available to help the patient quit smoking. Declined chantix 2012 due to fear of side effects - encouraged investigation into e-cig

## 2012-02-13 NOTE — Assessment & Plan Note (Signed)
S/p intervention by NSurg (kritzer) 05/2011 - 100% resolved Reviewed interval hx and preop MRI with pt today

## 2012-02-13 NOTE — Patient Instructions (Addendum)
It was good to see you today. Cipro and Flagyl antibiotics for diverticulitis infection - Your prescription(s) have been submitted to your pharmacy. Please take as directed and contact our office if you believe you are having problem(s) with the medication(s). Continue to think about giving up cigarettes! Use nicotine gum, nicotine patches or electronic cigarettes. If you're interested in medication to help you quit, please call  - let me know how I can help! Please schedule followup in 3-6 months for review, call sooner if problems.

## 2012-02-13 NOTE — Progress Notes (Signed)
  Subjective:    Patient ID: Alec [name], male    DOB: Jun 15, 1956, 56 y.o.   MRN: [id number]  Diarrhea  This is a new problem. The current episode started in the past 7 days. The problem has been gradually worsening. The stool consistency is described as watery. The patient states that diarrhea awakens him from sleep. Associated symptoms include abdominal pain. Pertinent negatives include no bloating, coughing, fever, headaches, vomiting or weight loss. Nothing aggravates the symptoms. He has tried anti-motility drug for the symptoms. The treatment provided mild relief. There is no history of bowel resection, inflammatory bowel disease, malabsorption or a recent abdominal surgery.   Past Medical History  Diagnosis Date  . ALLERGIC RHINITIS   . DIVERTICULITIS, HX OF   . GERD   . LEG PAIN, LEFT   . SINUSITIS, CHRONIC   . SMOKER     Review of Systems  Constitutional: Negative for fever and weight loss.  Respiratory: Negative for cough.   Gastrointestinal: Positive for abdominal pain and diarrhea. Negative for vomiting and bloating.  Neurological: Negative for headaches.       Objective:   Physical Exam BP 130/70  Pulse 74  Temp 97.6 F (36.4 C) (Oral)  Ht 5\' 7"  (1.702 m)  Wt 197 lb (89.359 kg)  BMI 30.85 kg/m2  SpO2 97% Wt Readings from Last 3 Encounters:  02/13/12 197 lb (89.359 kg)  04/22/11 196 lb (88.905 kg)  04/04/11 199 lb 6.4 oz (90.447 kg)   Constitutional:  He appears well-developed and well-nourished. No distress.  Neck: Normal range of motion. Neck supple. No JVD present. No thyromegaly present.  Cardiovascular: Normal rate, regular rhythm and normal heart sounds.  No murmur heard. no BLE edema Pulmonary/Chest: Effort normal and breath sounds normal. No respiratory distress. no wheezes.  Abdominal: Soft but tender BLQ tenderness - no rebound/gaurding. Bowel sounds are normal. Patient exhibits no distension..  Skin: Skin is warm and dry.  No erythema or  ulceration.  Psychiatric: he has a normal mood and affect. behavior is normal. Judgment and thought content normal.   Lab Results  Component Value Date   WBC 6.5 05/20/2009   WBC 6.8 05/20/2009   HGB 11.5 05/20/2009   HGB 13.2 05/20/2009   HCT 35.4 05/20/2009   HCT 42.1 05/20/2009   PLT 192 05/20/2009   PLT 209.0 05/20/2009   GLUCOSE 130 05/20/2009   GLUCOSE 114* 05/20/2009   CHOL 164 05/20/2009   TRIG 144.0 05/20/2009   HDL 28.20* 05/20/2009   LDLCALC 107* 05/20/2009   ALT 18 05/20/2009   ALT 32 05/20/2009   AST 18 05/20/2009   AST 24 05/20/2009   NA 139 05/20/2009   NA 139 05/20/2009   K 3.8 05/20/2009   K 4.4 05/20/2009   CL 106 05/20/2009   CL 101 05/20/2009   CREATININE 1.00 05/20/2009   CREATININE 1.0 05/20/2009   BUN 19 05/20/2009   BUN 11 05/20/2009   CO2 30 05/20/2009   CO2 30 05/20/2009   TSH 0.68 05/20/2009   PSA 1.07 05/20/2009       Assessment & Plan:  Diarrhea/abdominal pain - suspect mild diverticulitis flare Reviewed ct a/p 03/2011 and colo 09/2009 tx flagyl + cipro - continue imodium prn - pt to call if worse or unimporved

## 2014-05-01 ENCOUNTER — Encounter: Payer: Self-pay | Admitting: Medical

## 2014-05-01 ENCOUNTER — Ambulatory Visit (HOSPITAL_BASED_OUTPATIENT_CLINIC_OR_DEPARTMENT_OTHER)
Admission: RE | Admit: 2014-05-01 | Discharge: 2014-05-01 | Disposition: A | Discharge status: Home / Self Care | Payer: No Typology Code available for payment source | Source: Ambulatory Visit | Attending: Medical | Admitting: Medical

## 2014-05-01 ENCOUNTER — Ambulatory Visit (INDEPENDENT_AMBULATORY_CARE_PROVIDER_SITE_OTHER): Payer: No Typology Code available for payment source | Admitting: Medical

## 2014-05-01 VITALS — BP 135/84 | HR 78 | Temp 99.0°F | Ht 69.5 in | Wt 203.6 lb

## 2014-05-01 DIAGNOSIS — M549 Dorsalgia, unspecified: Secondary | ICD-10-CM | POA: Insufficient documentation

## 2014-05-01 DIAGNOSIS — R1012 Left upper quadrant pain: Secondary | ICD-10-CM

## 2014-05-01 DIAGNOSIS — M7989 Other specified soft tissue disorders: Secondary | ICD-10-CM | POA: Diagnosis not present

## 2014-05-01 DIAGNOSIS — F17200 Nicotine dependence, unspecified, uncomplicated: Secondary | ICD-10-CM

## 2014-05-01 DIAGNOSIS — R591 Generalized enlarged lymph nodes: Secondary | ICD-10-CM

## 2014-05-01 DIAGNOSIS — M79632 Pain in left forearm: Secondary | ICD-10-CM

## 2014-05-01 DIAGNOSIS — M25539 Pain in unspecified wrist: Secondary | ICD-10-CM | POA: Insufficient documentation

## 2014-05-01 DIAGNOSIS — M25529 Pain in unspecified elbow: Secondary | ICD-10-CM | POA: Insufficient documentation

## 2014-05-01 DIAGNOSIS — R822 Biliuria: Secondary | ICD-10-CM

## 2014-05-01 DIAGNOSIS — M545 Low back pain, unspecified: Secondary | ICD-10-CM

## 2014-05-01 DIAGNOSIS — M25522 Pain in left elbow: Secondary | ICD-10-CM

## 2014-05-01 DIAGNOSIS — M25532 Pain in left wrist: Secondary | ICD-10-CM | POA: Insufficient documentation

## 2014-05-01 DIAGNOSIS — Z559 Problems related to education and literacy, unspecified: Secondary | ICD-10-CM

## 2014-05-01 DIAGNOSIS — M79622 Pain in left upper arm: Secondary | ICD-10-CM

## 2014-05-01 DIAGNOSIS — R109 Unspecified abdominal pain: Secondary | ICD-10-CM | POA: Insufficient documentation

## 2014-05-01 DIAGNOSIS — Z720 Tobacco use: Secondary | ICD-10-CM

## 2014-05-01 HISTORY — DX: Pain in unspecified wrist: M25.539

## 2014-05-01 HISTORY — DX: Pain in unspecified elbow: M25.529

## 2014-05-01 HISTORY — DX: Unspecified abdominal pain: R10.9

## 2014-05-01 HISTORY — DX: Dorsalgia, unspecified: M54.9

## 2014-05-01 HISTORY — DX: Nicotine dependence, unspecified, uncomplicated: F17.200

## 2014-05-01 MED ORDER — BUPROPION HCL ER (SR) 150 MG PO TB12: 150.0000 mg | ORAL_TABLET | Freq: Two times a day (BID) | ORAL | Status: DC

## 2014-05-01 MED ORDER — DICLOFENAC SODIUM 75 MG PO TBEC: 75.0000 mg | DELAYED_RELEASE_TABLET | Freq: Two times a day (BID) | ORAL | Status: DC

## 2014-05-01 NOTE — Assessment & Plan Note (Signed)
I did get an x-ray of his left form today. Will follow and see if there is any abnormality at and call patient with the result.

## 2014-05-01 NOTE — Assessment & Plan Note (Signed)
After discussion with the patient and education he was agreeable to try Wellbutrin.

## 2014-05-01 NOTE — Assessment & Plan Note (Signed)
With the pain in his left axillary area and left forearm I thought it was best to go ahead and get an EKG. This is a good idea in light of the fact that he is 58 years old and a long-time smoker. EKG looked to be normal sinus rhythm.

## 2014-05-01 NOTE — Assessment & Plan Note (Signed)
This is a new pain without any injury reported. The pain is not worse with activity. And the pain is minimal. Does not represent at this point renal colic.

## 2014-05-01 NOTE — Progress Notes (Signed)
Subjective:    Patient ID: Alec [name], male    DOB: 1955-11-06, 58 y.o.   MRN: [id number]  HPI  PMH, PSH, and FH reviewed.  Pt first time here. 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 Married- 1 stepson.  Last time complete physical more than 5 yrs ago.  Pt never takes flu vaccine.  Pt states he has some lower back pain is constant. Pain is lumbar area. Pain is not on movement. Pt states one week ago he had pain in back and left side abdomen. Abdomen feels good now. Maybe faint tender. No constipatoin and no diarrhea. His urine does not have have any blood. No coughing fever or chills.   Pt is a smoker and tried chantix but scared of side effects.   Also some minimal faint lt forearm pain distal medial aspect and some lt axillary area tenderness. For 2 wks. No chest pain.  Past Medical History  Diagnosis Date  . ALLERGIC RHINITIS   . DIVERTICULITIS, HX OF   . GERD   . LEG PAIN, LEFT   . SINUSITIS, CHRONIC   . SMOKER   . Allergy     History   Social History  . Marital Status: Married    Spouse Name: N/A    Number of Children: N/A  . Years of Education: N/A   Occupational History  . Not on file.   Social History Main Topics  . Smoking status: Current Every Day Smoker -- 1.50 packs/day for 40 years  . Smokeless tobacco: Not on file     Comment: Married, lives with wife (MM dx 2009)  . Alcohol Use: No  . Drug Use: No  . Sexual Activity: Yes   Other Topics Concern  . 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 Spine surgery      lumbar surgery 2012.    Family History  Problem Relation Age of Onset  . Alzheimer's disease Mother   . Heart disease Father   . Parkinsonism Father   . Alcohol abuse Brother     No Known Allergies  Current Outpatient Prescriptions on File Prior to Visit    Medication Sig Dispense Refill  . acetaminophen (TYLENOL) 500 MG tablet Take 500 mg by mouth as needed.        . fluticasone (FLONASE) 50 MCG/ACT nasal spray Place 1 spray into the nose every morning.        . loratadine-pseudoephedrine (CLARITIN-D 12-HOUR) 5-120 MG per tablet Take 1 tablet by mouth every morning.        Marland Kitchen omeprazole (PRILOSEC) 20 MG capsule Take 20 mg by mouth daily.         No current facility-administered medications on file prior to visit.    BP 135/84  Pulse 78  Temp(Src) 99 F (37.2 C) (Oral)  Ht 5' 9.5" (1.765 m)  Wt 203 lb 9.6 oz (92.352 kg)  BMI 29.65 kg/m2  SpO2 99%           Review of Systems  Constitutional: Negative for fever, chills and fatigue.  HENT: Negative.   Respiratory: Negative for cough, choking, shortness of breath and wheezing.   Cardiovascular: Negative for chest pain and palpitations.  Gastrointestinal: Negative.  Negative for nausea, vomiting, abdominal pain, diarrhea, constipation, blood in stool, abdominal distention and rectal pain.  Some flank pain on the left side about one week ago but none since.  Genitourinary: Negative.   Musculoskeletal: Positive for back pain.       Mild left sided back pain at times and points over the CVA area.  He has some left forearm area pain/distal wrist region over the last week. No injury or fall. The area just started to hurt minimally.  Skin:       No rash or vesicles reported of the left side of back or left abdomen area.  Neurological: Negative.   Hematological: Negative for adenopathy. Does not bruise/bleed easily.       He has some mild left axillary pain. Present for the last week on and off. He has not had any shoulder pain nor does report any chest pain.   Psychiatric/Behavioral: Negative.        Objective:   Physical Exam  Constitutional: He is oriented to person, place, and time. He appears well-developed and well-nourished. No distress.  HENT:  Head: Normocephalic  and atraumatic.  Left Ear: External ear normal.  Eyes: Conjunctivae and EOM are normal. Pupils are equal, round, and reactive to light. Right eye exhibits no discharge. Left eye exhibits no discharge. No scleral icterus.  Neck: Normal range of motion. Neck supple. No JVD present. No tracheal deviation present. No thyromegaly present.  Cardiovascular: Normal rate, regular rhythm and normal heart sounds.  Exam reveals no gallop.   No murmur heard. Pulmonary/Chest: Effort normal and breath sounds normal. No stridor. No respiratory distress. He has no wheezes. He has no rales. He exhibits no tenderness.  Abdominal: Soft. Bowel sounds are normal. He exhibits no distension and no mass. There is no tenderness. There is no rebound and no guarding.  Musculoskeletal:  Left shoulder-full range of motion no pain on palpation. No pain on abduction and adduction.  Left axillary- on palpation there is no obvious palpable lymphadenopathy but he does report minimal pain.  Left forearm-on pronation and supination he has no pain. At the very distal aspect of the wrist region he has minimal pain on palpation. No redness swelling or edema.  Back-no mid lumbar region pain on palpation. The area where he reports he has pain is basically just above the left CVA region.  Lymphadenopathy:    He has no cervical adenopathy.  Neurological: He is alert and oriented to person, place, and time. No cranial nerve deficit. Coordination normal.  Skin: Skin is warm and dry. He is not diaphoretic.   On his left side lower back and abdomen he has no rash or vesicles whatsoever.  Psychiatric: He has a normal mood and affect. His behavior is normal. Judgment and thought content normal.            Assessment & Plan:

## 2014-05-01 NOTE — Progress Notes (Signed)
Pre visit review using our clinic review tool, if applicable. No additional management support is needed unless otherwise documented below in the visit note. 

## 2014-05-01 NOTE — Patient Instructions (Signed)
For your back pain, I will prescribe diclofenac. We will see if this resolves if not then expand work up.  For your lt arm/forearm pain, I will order lt forearm xray today. You can get that done today.  For your smoking, I will rx wellbutrin.   For the left axillary pain, I want to get cbc in event it is a lymph node I am unable to palpate. Also with the left axillary pain and forearm pain, I think ekg would be a good idea.  You have some bilirubin in your urine and will get a cmp.  Please get me name of your gi doctor so I can refer you back to them for colonoscopy at appropriate time. This is important in light of your polyp hx and your nondescript lt side back pain and flank pain.  Please schedule complete wellness exam within a month. Early am and come in fasting.

## 2014-05-01 NOTE — Assessment & Plan Note (Signed)
None presently but he did have some last week. It is unclear the etiology of this pain. She does have occasional back pain I do want to make sure that he gets his colonoscopy done at the appropriate time. Note on the AVS he will investigate and let us know when he is due for the colonoscopy.

## 2014-05-01 NOTE — Assessment & Plan Note (Signed)
Possible left axillary area. Is a new patient I do want to do a CBC.

## 2014-05-02 LAB — CBC WITH DIFFERENTIAL/PLATELET
Basophils Absolute: 0.1 10*3/uL (ref 0.0–0.1)
Basophils Relative: 0.9 % (ref 0.0–3.0)
Eosinophils Absolute: 0.1 10*3/uL (ref 0.0–0.7)
Eosinophils Relative: 1.3 % (ref 0.0–5.0)
HCT: 38.7 % — ABNORMAL LOW (ref 39.0–52.0)
Hemoglobin: 11.9 g/dL — ABNORMAL LOW (ref 13.0–17.0)
Lymphocytes Relative: 36.5 % (ref 12.0–46.0)
Lymphs Abs: 2.2 10*3/uL (ref 0.7–4.0)
MCHC: 30.8 g/dL (ref 30.0–36.0)
MCV: 65.3 fl — ABNORMAL LOW (ref 78.0–100.0)
Monocytes Absolute: 0.3 10*3/uL (ref 0.1–1.0)
Monocytes Relative: 5.3 % (ref 3.0–12.0)
Neutro Abs: 3.3 10*3/uL (ref 1.4–7.7)
Neutrophils Relative %: 56 % (ref 43.0–77.0)
Platelets: 189 10*3/uL (ref 150.0–400.0)
RBC: 5.93 Mil/uL — ABNORMAL HIGH (ref 4.22–5.81)
RDW: 16 % — ABNORMAL HIGH (ref 11.5–15.5)
WBC: 5.9 10*3/uL (ref 4.0–10.5)

## 2014-05-02 LAB — COMPREHENSIVE METABOLIC PANEL
ALT: 21 U/L (ref 0–53)
AST: 18 U/L (ref 0–37)
Albumin: 4.3 g/dL (ref 3.5–5.2)
Alkaline Phosphatase: 62 U/L (ref 39–117)
BUN: 18 mg/dL (ref 6–23)
CO2: 25 mEq/L (ref 19–32)
Calcium: 9.5 mg/dL (ref 8.4–10.5)
Chloride: 105 mEq/L (ref 96–112)
Creatinine, Ser: 1.1 mg/dL (ref 0.4–1.5)
GFR: 75.46 mL/min (ref >60.00)
Glucose, Bld: 102 mg/dL — ABNORMAL HIGH (ref 70–99)
Potassium: 4.4 mEq/L (ref 3.5–5.1)
Sodium: 142 mEq/L (ref 135–145)
Total Bilirubin: 1.1 mg/dL (ref 0.2–1.2)
Total Protein: 7.8 g/dL (ref 6.0–8.3)

## 2014-05-06 ENCOUNTER — Encounter: Payer: Self-pay | Admitting: Medical

## 2014-05-06 ENCOUNTER — Ambulatory Visit (INDEPENDENT_AMBULATORY_CARE_PROVIDER_SITE_OTHER): Payer: No Typology Code available for payment source | Admitting: Medical

## 2014-05-06 VITALS — BP 131/83 | HR 72 | Temp 98.4°F | Ht 69.5 in | Wt 205.2 lb

## 2014-05-06 DIAGNOSIS — D649 Anemia, unspecified: Secondary | ICD-10-CM | POA: Insufficient documentation

## 2014-05-06 DIAGNOSIS — R591 Generalized enlarged lymph nodes: Secondary | ICD-10-CM

## 2014-05-06 DIAGNOSIS — M25532 Pain in left wrist: Secondary | ICD-10-CM

## 2014-05-06 HISTORY — DX: Anemia, unspecified: D64.9

## 2014-05-06 LAB — VITAMIN B12: Vitamin B-12: 372 pg/mL (ref 211–911)

## 2014-05-06 LAB — IRON AND TIBC
%SAT: 20 % (ref 20–55)
Iron: 66 ug/dL (ref 42–165)
TIBC: 330 ug/dL (ref 215–435)
UIBC: 264 ug/dL (ref 125–400)

## 2014-05-06 LAB — FOLATE: Folate: >24.8 ng/mL (ref >5.9)

## 2014-05-06 LAB — FERRITIN: Ferritin: 234 ng/mL (ref 22.0–322.0)

## 2014-05-06 NOTE — Progress Notes (Signed)
Subjective:    Patient ID: Alec [name], male    DOB: 05-24-1956, 58 y.o.   MRN: [id number]  HPI  Pt in for follow up. He had anemia. 2 points lower on hb. And about 3 on HCT. Pt states he does take a multivitamin. No dark black stools. NO bright red stools. No nose bleeds. No reported fatigue.  Pt bs was 102. Just slight above upper limit.  Pt left distal forearm feels better. Left axillary area also feels better as well.  Past Medical History  Diagnosis Date  . ALLERGIC RHINITIS   . DIVERTICULITIS, HX OF   . GERD   . LEG PAIN, LEFT   . SINUSITIS, CHRONIC   . SMOKER   . Allergy     History   Social History  . Marital Status: Married    Spouse Name: N/A    Number of Children: N/A  . Years of Education: N/A   Occupational History  . Not on file.   Social History Main Topics  . Smoking status: Current Every Day Smoker -- 1.50 packs/day for 40 years  . Smokeless tobacco: Not on file     Comment: Married, lives with wife (MM dx 2009)  . Alcohol Use: No  . Drug Use: No  . Sexual Activity: Yes   Other Topics Concern  . 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 Spine surgery      lumbar surgery 2012.    Family History  Problem Relation Age of Onset  . Alzheimer's disease Mother   . Heart disease Father   . Parkinsonism Father   . Alcohol abuse Brother     No Known Allergies  Current Outpatient Prescriptions on File Prior to Visit  Medication Sig Dispense Refill  . acetaminophen (TYLENOL) 500 MG tablet Take 500 mg by mouth as needed.        Marland Kitchen buPROPion (WELLBUTRIN SR) 150 MG 12 hr tablet Take 1 tablet (150 mg total) by mouth 2 (two) times daily.  63 tablet  1  . diclofenac (VOLTAREN) 75 MG EC tablet Take 1 tablet (75 mg total) by mouth 2 (two) times daily.  30 tablet  0  . fluticasone (FLONASE) 50 MCG/ACT nasal spray Place 1 spray into the nose  every morning.        . loratadine-pseudoephedrine (CLARITIN-D 12-HOUR) 5-120 MG per tablet Take 1 tablet by mouth every morning.        Marland Kitchen omeprazole (PRILOSEC) 20 MG capsule Take 20 mg by mouth daily.         No current facility-administered medications on file prior to visit.    BP 131/83  Pulse 72  Temp(Src) 98.4 F (36.9 C) (Oral)  Ht 5' 9.5" (1.765 m)  Wt 205 lb 3.2 oz (93.078 kg)  BMI 29.88 kg/m2  SpO2 100%            Review of Systems  Constitutional: Negative for fever, chills and fatigue.  HENT: Negative.   Respiratory: Negative for cough, choking, shortness of breath and wheezing.   Cardiovascular: Negative for chest pain and palpitations.  Gastrointestinal: Negative.  Negative for nausea, vomiting, abdominal pain, diarrhea, constipation, blood in stool, abdominal distention and rectal pain.       Some flank pain on the left side about one week ago but none since.  Genitourinary: Negative.   Musculoskeletal: Negative for  back pain.         He has some left forearm area pain/distal wrist region is now almost completley better. No injury or fall. The area just started to hurt minimally.  Lt axillary area feeling better as well.  Skin: Negative.   Neurological: Negative.   Hematological: Negative for adenopathy. Does not bruise/bleed easily.       He has some mild left axillary pain. Present for the last week on and off. He has not had any shoulder pain nor does report any chest pain. Area in lt axillary region a lot better after he stopped playing golf. He thinks swing makes area hurt.  Psychiatric/Behavioral: Negative.        Objective:   Physical Exam  Constitutional: He is oriented to person, place, and time. He appears well-developed and well-nourished. No distress.  Neck: Normal range of motion. Neck supple. No JVD present. No tracheal deviation present. No thyromegaly present.  Cardiovascular: Normal rate, regular rhythm and normal heart sounds.  Exam  reveals no gallop.   No murmur heard. Pulmonary/Chest: Effort normal and breath sounds normal. No stridor. No respiratory distress. He has no wheezes. He has no rales. He exhibits no tenderness.  Abdominal: Soft. Bowel sounds are normal. He exhibits no distension and no mass. There is no tenderness. There is no rebound and no guarding.  Musculoskeletal:    Left axillary- on palpation there is no obvious palpable lymphadenopathy and no pain today per pt.  Left forearm-on pronation and supination he has no pain. Area better on palpation today.   Back-no mid lumbar region pain on palpation. No lt cva area pain.  Lymphadenopathy:    He has no cervical adenopathy.  Neurological: He is alert and oriented to person, place, and time. No cranial nerve deficit. Coordination normal.  Skin: Skin is warm and dry. He is not diaphoretic.  Still no rash on his back.  Psychiatric: He has a normal mood and affect. His behavior is normal. Judgment and thought content normal.           Assessment & Plan:

## 2014-05-06 NOTE — Progress Notes (Signed)
Pre visit review using our clinic review tool, if applicable. No additional management support is needed unless otherwise documented below in the visit note. 

## 2014-05-06 NOTE — Patient Instructions (Addendum)
For your anemia, we will do blood work to evaluate type and do stool cards for blood.  For your mild high blood sugar,we will get a1-c.  Your slight forearm pain  Is likely soft tissue since xray neg. Lt axillary area also getting better. Lay off of golf for one more week.  Follow up date to be determined based on lab results.

## 2014-05-06 NOTE — Assessment & Plan Note (Signed)
Much improved and I think soft tissue based on his improvement and his xray results.

## 2014-05-06 NOTE — Assessment & Plan Note (Signed)
Will get anemia panel studies and then hemoccult cards today.

## 2014-05-06 NOTE — Assessment & Plan Note (Signed)
Cbc was normal. I could not appreciate true lymph node. I think pain was muscular. Stopping as he stops playing golf. He thinks swing causes pain. Will resolve this problem. If this returns then would get [place] and repeat cbc.

## 2014-05-13 ENCOUNTER — Other Ambulatory Visit (INDEPENDENT_AMBULATORY_CARE_PROVIDER_SITE_OTHER): Payer: No Typology Code available for payment source

## 2014-05-13 DIAGNOSIS — D649 Anemia, unspecified: Secondary | ICD-10-CM

## 2014-05-13 LAB — POC HEMOCCULT BLD/STL (HOME/3-CARD/SCREEN)
Card #2 Fecal Occult Blod, POC: NEGATIVE
Card #3 Fecal Occult Blood, POC: NEGATIVE
Fecal Occult Blood, POC: NEGATIVE

## 2014-06-02 ENCOUNTER — Ambulatory Visit (INDEPENDENT_AMBULATORY_CARE_PROVIDER_SITE_OTHER): Payer: No Typology Code available for payment source | Admitting: Medical

## 2014-06-02 VITALS — BP 138/75 | HR 81 | Temp 97.3°F | Ht 69.5 in | Wt 204.8 lb

## 2014-06-02 DIAGNOSIS — Z0000 Encounter for general adult medical examination without abnormal findings: Secondary | ICD-10-CM

## 2014-06-02 DIAGNOSIS — Z0189 Encounter for other specified special examinations: Secondary | ICD-10-CM

## 2014-06-02 DIAGNOSIS — Z23 Encounter for immunization: Secondary | ICD-10-CM

## 2014-06-02 HISTORY — DX: Encounter for general adult medical examination without abnormal findings: Z00.00

## 2014-06-02 LAB — LIPID PANEL
Cholesterol: 147 mg/dL (ref 0–200)
HDL: 31.1 mg/dL — ABNORMAL LOW (ref >39.00)
LDL Cholesterol: 87 mg/dL (ref 0–99)
NonHDL: 115.9
Total CHOL/HDL Ratio: 5
Triglycerides: 145 mg/dL (ref 0.0–149.0)
VLDL: 29 mg/dL (ref 0.0–40.0)

## 2014-06-02 LAB — COMPREHENSIVE METABOLIC PANEL
ALT: 29 U/L (ref 0–53)
AST: 21 U/L (ref 0–37)
Albumin: 4.7 g/dL (ref 3.5–5.2)
Alkaline Phosphatase: 69 U/L (ref 39–117)
BUN: 16 mg/dL (ref 6–23)
CO2: 29 mEq/L (ref 19–32)
Calcium: 9.2 mg/dL (ref 8.4–10.5)
Chloride: 99 mEq/L (ref 96–112)
Creatinine, Ser: 0.9 mg/dL (ref 0.4–1.5)
GFR: 93.3 mL/min (ref >60.00)
Glucose, Bld: 166 mg/dL — ABNORMAL HIGH (ref 70–99)
Potassium: 3.7 mEq/L (ref 3.5–5.1)
Sodium: 138 mEq/L (ref 135–145)
Total Bilirubin: 1 mg/dL (ref 0.2–1.2)
Total Protein: 7.1 g/dL (ref 6.0–8.3)

## 2014-06-02 LAB — CBC WITH DIFFERENTIAL/PLATELET
Basophils Absolute: 0 10*3/uL (ref 0.0–0.1)
Basophils Relative: 0.3 % (ref 0.0–3.0)
Eosinophils Absolute: 0.1 10*3/uL (ref 0.0–0.7)
Eosinophils Relative: 1.2 % (ref 0.0–5.0)
HCT: 39.9 % (ref 39.0–52.0)
Hemoglobin: 12.4 g/dL — ABNORMAL LOW (ref 13.0–17.0)
Lymphocytes Relative: 36.3 % (ref 12.0–46.0)
Lymphs Abs: 2.8 10*3/uL (ref 0.7–4.0)
MCHC: 31.1 g/dL (ref 30.0–36.0)
MCV: 64.6 fl — ABNORMAL LOW (ref 78.0–100.0)
Monocytes Absolute: 0.4 10*3/uL (ref 0.1–1.0)
Monocytes Relative: 5.7 % (ref 3.0–12.0)
Neutro Abs: 4.3 10*3/uL (ref 1.4–7.7)
Neutrophils Relative %: 56.5 % (ref 43.0–77.0)
Platelets: 203 10*3/uL (ref 150.0–400.0)
RBC: 6.18 Mil/uL — ABNORMAL HIGH (ref 4.22–5.81)
RDW: 15.3 % (ref 11.5–15.5)
WBC: 7.6 10*3/uL (ref 4.0–10.5)

## 2014-06-02 LAB — TSH: TSH: 0.84 u[IU]/mL (ref 0.35–4.50)

## 2014-06-02 LAB — PSA: PSA: 1.56 ng/mL (ref 0.10–4.00)

## 2014-06-02 NOTE — Patient Instructions (Addendum)
We will gave you tdap today. And please get fasting labs today.   Preventive Care for Adults A healthy lifestyle and preventive care can promote health and wellness. Preventive health guidelines for men include the following key practices:  A routine yearly physical is a good way to check with your health care provider about your health and preventative screening. It is a chance to share any concerns and updates on your health and to receive a thorough exam.  Visit your dentist for a routine exam and preventative care every 6 months. Brush your teeth twice a day and floss once a day. Good oral hygiene prevents tooth decay and gum disease.  The frequency of eye exams is based on your age, health, family medical history, use of contact lenses, and other factors. Follow your health care provider's recommendations for frequency of eye exams.  Eat a healthy diet. Foods such as vegetables, fruits, whole grains, low-fat dairy products, and lean protein foods contain the nutrients you need without too many calories. Decrease your intake of foods high in solid fats, added sugars, and salt. Eat the right amount of calories for you.Get information about a proper diet from your health care provider, if necessary.  Regular physical exercise is one of the most important things you can do for your health. Most adults should get at least 150 minutes of moderate-intensity exercise (any activity that increases your heart rate and causes you to sweat) each week. In addition, most adults need muscle-strengthening exercises on 2 or more days a week.  Maintain a healthy weight. The body mass index (BMI) is a screening tool to identify possible weight problems. It provides an estimate of body fat based on height and weight. Your health care provider can find your BMI and can help you achieve or maintain a healthy weight.For adults 20 years and older:  A BMI below 18.5 is considered underweight.  A BMI of 18.5 to 24.9  is normal.  A BMI of 25 to 29.9 is considered overweight.  A BMI of 30 and above is considered obese.  Maintain normal blood lipids and cholesterol levels by exercising and minimizing your intake of saturated fat. Eat a balanced diet with plenty of fruit and vegetables. Blood tests for lipids and cholesterol should begin at age 11 and be repeated every 5 years. If your lipid or cholesterol levels are high, you are over 50, or you are at high risk for heart disease, you may need your cholesterol levels checked more frequently.Ongoing high lipid and cholesterol levels should be treated with medicines if diet and exercise are not working.  If you smoke, find out from your health care provider how to quit. If you do not use tobacco, do not start.  Lung cancer screening is recommended for adults aged 62-80 years who are at high risk for developing lung cancer because of a history of smoking. A yearly low-dose CT scan of the lungs is recommended for people who have at least a 30-pack-year history of smoking and are a current smoker or have quit within the past 15 years. A pack year of smoking is smoking an average of 1 pack of cigarettes a day for 1 year (for example: 1 pack a day for 30 years or 2 packs a day for 15 years). Yearly screening should continue until the smoker has stopped smoking for at least 15 years. Yearly screening should be stopped for people who develop a health problem that would prevent them from having lung  cancer treatment.  If you choose to drink alcohol, do not have more than 2 drinks per day. One drink is considered to be 12 ounces (355 mL) of beer, 5 ounces (148 mL) of wine, or 1.5 ounces (44 mL) of liquor.  Avoid use of street drugs. Do not share needles with anyone. Ask for help if you need support or instructions about stopping the use of drugs.  High blood pressure causes heart disease and increases the risk of stroke. Your blood pressure should be checked at least every  1-2 years. Ongoing high blood pressure should be treated with medicines, if weight loss and exercise are not effective.  If you are 51-12 years old, ask your health care provider if you should take aspirin to prevent heart disease.  Diabetes screening involves taking a blood sample to check your fasting blood sugar level. This should be done once every 3 years, after age 81, if you are within normal weight and without risk factors for diabetes. Testing should be considered at a younger age or be carried out more frequently if you are overweight and have at least 1 risk factor for diabetes.  Colorectal cancer can be detected and often prevented. Most routine colorectal cancer screening begins at the age of 73 and continues through age 44. However, your health care provider may recommend screening at an earlier age if you have risk factors for colon cancer. On a yearly basis, your health care provider may provide home test kits to check for hidden blood in the stool. Use of a small camera at the end of a tube to directly examine the colon (sigmoidoscopy or colonoscopy) can detect the earliest forms of colorectal cancer. Talk to your health care provider about this at age 77, when routine screening begins. Direct exam of the colon should be repeated every 5-10 years through age 88, unless early forms of precancerous polyps or small growths are found.  People who are at an increased risk for hepatitis B should be screened for this virus. You are considered at high risk for hepatitis B if:  You were born in a country where hepatitis B occurs often. Talk with your health care provider about which countries are considered high risk.  Your parents were born in a high-risk country and you have not received a shot to protect against hepatitis B (hepatitis B vaccine).  You have HIV or AIDS.  You use needles to inject street drugs.  You live with, or have sex with, someone who has hepatitis B.  You are a man  who has sex with other men (MSM).  You get hemodialysis treatment.  You take certain medicines for conditions such as cancer, organ transplantation, and autoimmune conditions.  Hepatitis C blood testing is recommended for all people born from [birth year] through 1965 and any individual with known risks for hepatitis C.  Practice safe sex. Use condoms and avoid high-risk sexual practices to reduce the spread of sexually transmitted infections (STIs). STIs include gonorrhea, chlamydia, syphilis, trichomonas, herpes, HPV, and human immunodeficiency virus (HIV). Herpes, HIV, and HPV are viral illnesses that have no cure. They can result in disability, cancer, and death.  If you are at risk of being infected with HIV, it is recommended that you take a prescription medicine daily to prevent HIV infection. This is called preexposure prophylaxis (PrEP). You are considered at risk if:  You are a man who has sex with other men (MSM) and have other risk factors.  You are  a heterosexual man, are sexually active, and are at increased risk for HIV infection.  You take drugs by injection.  You are sexually active with a partner who has HIV.  Talk with your health care provider about whether you are at high risk of being infected with HIV. If you choose to begin PrEP, you should first be tested for HIV. You should then be tested every 3 months for as long as you are taking PrEP.  A one-time screening for abdominal aortic aneurysm (AAA) and surgical repair of large AAAs by ultrasound are recommended for men ages 82 to 74 years who are current or former smokers.  Healthy men should no longer receive prostate-specific antigen (PSA) blood tests as part of routine cancer screening. Talk with your health care provider about prostate cancer screening.  Testicular cancer screening is not recommended for adult males who have no symptoms. Screening includes self-exam, a health care provider exam, and other screening tests.  Consult with your health care provider about any symptoms you have or any concerns you have about testicular cancer.  Use sunscreen. Apply sunscreen liberally and repeatedly throughout the day. You should seek shade when your shadow is shorter than you. Protect yourself by wearing long sleeves, pants, a wide-brimmed hat, and sunglasses year round, whenever you are outdoors.  Once a month, do a whole-body skin exam, using a mirror to look at the skin on your back. Tell your health care provider about new moles, moles that have irregular borders, moles that are larger than a pencil eraser, or moles that have changed in shape or color.  Stay current with required vaccines (immunizations).  Influenza vaccine. All adults should be immunized every year.  Tetanus, diphtheria, and acellular pertussis (Td, Tdap) vaccine. An adult who has not previously received Tdap or who does not know his vaccine status should receive 1 dose of Tdap. This initial dose should be followed by tetanus and diphtheria toxoids (Td) booster doses every 10 years. Adults with an unknown or incomplete history of completing a 3-dose immunization series with Td-containing vaccines should begin or complete a primary immunization series including a Tdap dose. Adults should receive a Td booster every 10 years.  Varicella vaccine. An adult without evidence of immunity to varicella should receive 2 doses or a second dose if he has previously received 1 dose.  Human papillomavirus (HPV) vaccine. Males aged 79-21 years who have not received the vaccine previously should receive the 3-dose series. Males aged 22-26 years may be immunized. Immunization is recommended through the age of 46 years for any male who has sex with males and did not get any or all doses earlier. Immunization is recommended for any person with an immunocompromised condition through the age of 35 years if he did not get any or all doses earlier. During the 3-dose series, the  second dose should be obtained 4-8 weeks after the first dose. The third dose should be obtained 24 weeks after the first dose and 16 weeks after the second dose.  Zoster vaccine. One dose is recommended for adults aged 50 years or older unless certain conditions are present.  Measles, mumps, and rubella (MMR) vaccine. Adults born before [birth year] generally are considered immune to measles and mumps. Adults born in [birth year] or later should have 1 or more doses of MMR vaccine unless there is a contraindication to the vaccine or there is laboratory evidence of immunity to each of the three diseases. A routine second dose of MMR vaccine  should be obtained at least 28 days after the first dose for students attending postsecondary schools, health care workers, or international travelers. People who received inactivated measles vaccine or an unknown type of measles vaccine during 1963-1967 should receive 2 doses of MMR vaccine. People who received inactivated mumps vaccine or an unknown type of mumps vaccine before 1979 and are at high risk for mumps infection should consider immunization with 2 doses of MMR vaccine. Unvaccinated health care workers born before [birth year] who lack laboratory evidence of measles, mumps, or rubella immunity or laboratory confirmation of disease should consider measles and mumps immunization with 2 doses of MMR vaccine or rubella immunization with 1 dose of MMR vaccine.  Pneumococcal 13-valent conjugate (PCV13) vaccine. When indicated, a person who is uncertain of his immunization history and has no record of immunization should receive the PCV13 vaccine. An adult aged 44 years or older who has certain medical conditions and has not been previously immunized should receive 1 dose of PCV13 vaccine. This PCV13 should be followed with a dose of pneumococcal polysaccharide (PPSV23) vaccine. The PPSV23 vaccine dose should be obtained at least 8 weeks after the dose of PCV13 vaccine. An adult aged 17 years  or older who has certain medical conditions and previously received 1 or more doses of PPSV23 vaccine should receive 1 dose of PCV13. The PCV13 vaccine dose should be obtained 1 or more years after the last PPSV23 vaccine dose.  Pneumococcal polysaccharide (PPSV23) vaccine. When PCV13 is also indicated, PCV13 should be obtained first. All adults aged 28 years and older should be immunized. An adult younger than age 53 years who has certain medical conditions should be immunized. Any person who resides in a nursing home or long-term care facility should be immunized. An adult smoker should be immunized. People with an immunocompromised condition and certain other conditions should receive both PCV13 and PPSV23 vaccines. People with human immunodeficiency virus (HIV) infection should be immunized as soon as possible after diagnosis. Immunization during chemotherapy or radiation therapy should be avoided. Routine use of PPSV23 vaccine is not recommended for American Indians, [place] Natives, or people younger than 65 years unless there are medical conditions that require PPSV23 vaccine. When indicated, people who have unknown immunization and have no record of immunization should receive PPSV23 vaccine. One-time revaccination 5 years after the first dose of PPSV23 is recommended for people aged 19-64 years who have chronic kidney failure, nephrotic syndrome, asplenia, or immunocompromised conditions. People who received 1-2 doses of PPSV23 before age 96 years should receive another dose of PPSV23 vaccine at age 81 years or later if at least 5 years have passed since the previous dose. Doses of PPSV23 are not needed for people immunized with PPSV23 at or after age 83 years.  Meningococcal vaccine. Adults with asplenia or persistent complement component deficiencies should receive 2 doses of quadrivalent meningococcal conjugate (MenACWY-D) vaccine. The doses should be obtained at least 2 months apart. Microbiologists  working with certain meningococcal bacteria, [place] recruits, people at risk during an outbreak, and people who travel to or live in countries with a high rate of meningitis should be immunized. A first-year college student up through age 18 years who is living in a residence hall should receive a dose if he did not receive a dose on or after his 16th birthday. Adults who have certain high-risk conditions should receive one or more doses of vaccine.  Hepatitis A vaccine. Adults who wish to be protected from this disease, have  certain high-risk conditions, work with hepatitis A-infected animals, work in hepatitis A research labs, or travel to or work in countries with a high rate of hepatitis A should be immunized. Adults who were previously unvaccinated and who anticipate close contact with an international adoptee during the first 60 days after arrival in the [place] from a country with a high rate of hepatitis A should be immunized.  Hepatitis B vaccine. Adults should be immunized if they wish to be protected from this disease, have certain high-risk conditions, may be exposed to blood or other infectious body fluids, are household contacts or sex partners of hepatitis B positive people, are clients or workers in certain care facilities, or travel to or work in countries with a high rate of hepatitis B.  Haemophilus influenzae type b (Hib) vaccine. A previously unvaccinated person with asplenia or sickle cell disease or having a scheduled splenectomy should receive 1 dose of Hib vaccine. Regardless of previous immunization, a recipient of a hematopoietic stem cell transplant should receive a 3-dose series 6-12 months after his successful transplant. Hib vaccine is not recommended for adults with HIV infection. Preventive Service / Frequency Ages 43 to 26  Blood pressure check.** / Every 1 to 2 years.  Lipid and cholesterol check.** / Every 5 years beginning at age 51.  Hepatitis C blood  test.** / For any individual with known risks for hepatitis C.  Skin self-exam. / Monthly.  Influenza vaccine. / Every year.  Tetanus, diphtheria, and acellular pertussis (Tdap, Td) vaccine.** / Consult your health care provider. 1 dose of Td every 10 years.  Varicella vaccine.** / Consult your health care provider.  HPV vaccine. / 3 doses over 6 months, if 27 or younger.  Measles, mumps, rubella (MMR) vaccine.** / You need at least 1 dose of MMR if you were born in 1957 or later. You may also need a second dose.  Pneumococcal 13-valent conjugate (PCV13) vaccine.** / Consult your health care provider.  Pneumococcal polysaccharide (PPSV23) vaccine.** / 1 to 2 doses if you smoke cigarettes or if you have certain conditions.  Meningococcal vaccine.** / 1 dose if you are age 81 to 43 years and a Market researcher living in a residence hall, or have one of several medical conditions. You may also need additional booster doses.  Hepatitis A vaccine.** / Consult your health care provider.  Hepatitis B vaccine.** / Consult your health care provider.  Haemophilus influenzae type b (Hib) vaccine.** / Consult your health care provider. Ages 62 to 76  Blood pressure check.** / Every 1 to 2 years.  Lipid and cholesterol check.** / Every 5 years beginning at age 52.  Lung cancer screening. / Every year if you are aged 110-80 years and have a 30-pack-year history of smoking and currently smoke or have quit within the past 15 years. Yearly screening is stopped once you have quit smoking for at least 15 years or develop a health problem that would prevent you from having lung cancer treatment.  Fecal occult blood test (FOBT) of stool. / Every year beginning at age 52 and continuing until age 28. You may not have to do this test if you get a colonoscopy every 10 years.  Flexible sigmoidoscopy** or colonoscopy.** / Every 5 years for a flexible sigmoidoscopy or every 10 years for a colonoscopy  beginning at age 52 and continuing until age 45.  Hepatitis C blood test.** / For all people born from [birth year] through 1965 and any individual with  known risks for hepatitis C.  Skin self-exam. / Monthly.  Influenza vaccine. / Every year.  Tetanus, diphtheria, and acellular pertussis (Tdap/Td) vaccine.** / Consult your health care provider. 1 dose of Td every 10 years.  Varicella vaccine.** / Consult your health care provider.  Zoster vaccine.** / 1 dose for adults aged 79 years or older.  Measles, mumps, rubella (MMR) vaccine.** / You need at least 1 dose of MMR if you were born in 1957 or later. You may also need a second dose.  Pneumococcal 13-valent conjugate (PCV13) vaccine.** / Consult your health care provider.  Pneumococcal polysaccharide (PPSV23) vaccine.** / 1 to 2 doses if you smoke cigarettes or if you have certain conditions.  Meningococcal vaccine.** / Consult your health care provider.  Hepatitis A vaccine.** / Consult your health care provider.  Hepatitis B vaccine.** / Consult your health care provider.  Haemophilus influenzae type b (Hib) vaccine.** / Consult your health care provider. Ages 110 and over  Blood pressure check.** / Every 1 to 2 years.  Lipid and cholesterol check.**/ Every 5 years beginning at age 57.  Lung cancer screening. / Every year if you are aged 79-80 years and have a 30-pack-year history of smoking and currently smoke or have quit within the past 15 years. Yearly screening is stopped once you have quit smoking for at least 15 years or develop a health problem that would prevent you from having lung cancer treatment.  Fecal occult blood test (FOBT) of stool. / Every year beginning at age 19 and continuing until age 52. You may not have to do this test if you get a colonoscopy every 10 years.  Flexible sigmoidoscopy** or colonoscopy.** / Every 5 years for a flexible sigmoidoscopy or every 10 years for a colonoscopy beginning at age 25 and  continuing until age 43.  Hepatitis C blood test.** / For all people born from [birth year] through 1965 and any individual with known risks for hepatitis C.  Abdominal aortic aneurysm (AAA) screening.** / A one-time screening for ages 34 to 70 years who are current or former smokers.  Skin self-exam. / Monthly.  Influenza vaccine. / Every year.  Tetanus, diphtheria, and acellular pertussis (Tdap/Td) vaccine.** / 1 dose of Td every 10 years.  Varicella vaccine.** / Consult your health care provider.  Zoster vaccine.** / 1 dose for adults aged 78 years or older.  Pneumococcal 13-valent conjugate (PCV13) vaccine.** / Consult your health care provider.  Pneumococcal polysaccharide (PPSV23) vaccine.** / 1 dose for all adults aged 36 years and older.  Meningococcal vaccine.** / Consult your health care provider.  Hepatitis A vaccine.** / Consult your health care provider.  Hepatitis B vaccine.** / Consult your health care provider.  Haemophilus influenzae type b (Hib) vaccine.** / Consult your health care provider. **Family history and personal history of risk and conditions may change your health care provider's recommendations. Document Released: 08/23/2001 Document Revised: 07/02/2013 Document Reviewed: 11/22/2010 [facility] Patient Information 2015 [place], [place]. This information is not intended to replace advice given to you by your health care provider. Make sure you discuss any questions you have with your health care provider.

## 2014-06-02 NOTE — Assessment & Plan Note (Signed)
We will gave you tdap today. And please get fasting labs today.

## 2014-06-02 NOTE — Progress Notes (Signed)
   Subjective:    Patient ID: Alec [name], male    DOB: 28-Jun-1956, 58 y.o.   MRN: [id number]  HPI   Pt in for CPE.   Pt reports no acute compaints.      Pt sees does see a urologist. Pt states 2 yrs ago had his psa checked. PSA was never elevated. No family hx of prostate CA.  Pt states colonscopy done 2011 per pt. Pt thinks 1-2 years before he goes back.  Pt eating moderate healthy diet. But admits to eating out some.  He only walks at work.  Pt does smoke. He does have Wellbutrin available to help to stop smoking.  Pt never gets fluvaccines and declines.  Pt agrees to tetanus update today.  Ekg done 04-2014.   Review of Systems  Constitutional: Negative for fever, chills and fatigue.  HENT: Negative for congestion, ear discharge, ear pain, nosebleeds, postnasal drip, rhinorrhea, sinus pressure, sneezing, sore throat and trouble swallowing.   Respiratory: Negative for cough, chest tightness, shortness of breath and wheezing.   Cardiovascular: Negative for chest pain and palpitations.  Gastrointestinal: Negative for nausea, abdominal pain, diarrhea and rectal pain.  Genitourinary: Negative for urgency, frequency, hematuria, flank pain, discharge, scrotal swelling, enuresis, difficulty urinating and penile pain.  Musculoskeletal: Negative for back pain.  Neurological: Negative for dizziness, tremors, seizures, syncope, facial asymmetry, weakness, light-headedness, numbness and headaches.  Hematological: Negative for adenopathy. Does not bruise/bleed easily.  Psychiatric/Behavioral: Negative for suicidal ideas, behavioral problems, self-injury and dysphoric mood. The patient is not nervous/anxious.        Objective:   Physical Exam   General Mental Status- Alert. Orientation- Oriented x3.  Build and Nutrition- Well nourished and Well Developed.  Skin General:-Normal. Color- Normal color. Moisture- Normal. Temperature-Warm.  Only small scattered moles. None of  concern on inspection.  HEENT  Ears- Normal. Auditory Canal- Bilateral-Normal. Tympanic Membrane- Bilateral-Normal. Eye Fundi-Bilateral-Normal. Pupil- bilateral- Direct reaction to light normal. Nose & Sinuses- Normal. Nostrils-Bilateral- Normal. Mouth & Throat-Normal.  Neck Neck- No Bruits or Masses. Trachea midline.  Thyroid- Normal.  Chest and Lung Exam Percussion: Quality and Intensity-Percussion normal. Percussion of the chest reveals- No Dullness.  Palpation: Palpation of the chest reveals- Non-tender- No dullness. Auscultation: Breath Sounds- Normal.  Adventitous Sounds:-No adventitious sounds.  Cardiovascular Inspection:- No Heaves. Auscultation:-Normal sinus rhythm without murmur gallop, S1 WNL and S2 WNL.  Abdomen Inspection:-Inspection Normal. Inspection of the abdomen reveals- No hernias Palpation/Percussion:- Palpation and Percussion of the Abdomen reveal- Non Tender and No Palpable abdominal masses. Liver: Other Characteristics- No hepatomegaly. Spleen:Other Characteristics- No Splenomegaly. Auscultation:- Auscultation of the abdomen reveals- Bowel sounds normal and No Abdominal bruits.  Male Genitourinary Urethra:- No discharge. Penis- Circumcised. Scrotum- No masses. Testes- Bilateral-Normal.  Rectal Anorectal Exam: Performed- Normal sphincter tone. No masses noted. Prostate smooth normal size. Stool HEME Negative.  Peripheral  Vascular Lower Extremity:Inspection- Bilateral-Inspection Normal No edema.   Neurologic  Mental Status:- Normal. Cranial Nerves:-Normal Bilaterally. III-XII Motor:-Normal. Strength:5/5 normal muscle strength-All Muscles. General Assessment of Reflexes: Right Knee-2+. Left Knee- 2+. Coordination-Normal. Gait- Normal.  Meningeal Signs- None.  Musculoskeletal Global Assessment General-Joints show full range of motion without obvious deformity and Normal muscle mass. Strength in upper and lower  extremities.  Lymphatics General lymphatics Description- No generalized lymphadenopathy.        Assessment & Plan:

## 2014-06-02 NOTE — Progress Notes (Signed)
Pre visit review using our clinic review tool, if applicable. No additional management support is needed unless otherwise documented below in the visit note. 

## 2014-06-04 ENCOUNTER — Other Ambulatory Visit (INDEPENDENT_AMBULATORY_CARE_PROVIDER_SITE_OTHER): Payer: No Typology Code available for payment source

## 2014-06-04 DIAGNOSIS — Z0189 Encounter for other specified special examinations: Secondary | ICD-10-CM

## 2014-06-04 LAB — HEMOGLOBIN A1C: Hgb A1c MFr Bld: 8.7 % — ABNORMAL HIGH (ref 4.6–6.5)

## 2014-06-04 LAB — LIPASE: Lipase: 49 U/L (ref 11.0–59.0)

## 2014-06-05 ENCOUNTER — Telehealth: Payer: Self-pay | Admitting: Medical

## 2014-06-05 MED ORDER — METFORMIN HCL 500 MG PO TABS: 500.0000 mg | ORAL_TABLET | Freq: Two times a day (BID) | ORAL | Status: DC

## 2014-06-05 NOTE — Telephone Encounter (Signed)
Patient's A1c was 8.7. I'm sending you a prescription of metformin to his pharmacy.

## 2014-06-09 ENCOUNTER — Other Ambulatory Visit: Payer: Self-pay

## 2014-06-09 MED ORDER — METFORMIN HCL 500 MG PO TABS: 500.0000 mg | ORAL_TABLET | Freq: Two times a day (BID) | ORAL | Status: DC

## 2014-06-09 NOTE — Telephone Encounter (Signed)
Left message on patients answering machine that medication (Metformin) has been sent to pharmacy.

## 2014-06-17 ENCOUNTER — Telehealth: Payer: Self-pay | Admitting: Medical

## 2014-06-17 NOTE — Telephone Encounter (Signed)
Will advise pt to stop metformin due to side effects of stomach cramping. Advise to make appointment. Will give him prescript of other type. I will look for coupon or saving card of newer type med.

## 2014-06-17 NOTE — Telephone Encounter (Signed)
Caller name:Gerstel, Fareed Relation to OE:VOJJ  Call back number: [phone] Pharmacy:  Reason for call:  Pt states medication prescribed for sugar is causing stomach cramping, in need of clinical advice

## 2014-06-18 NOTE — Telephone Encounter (Signed)
Left message to return call. Advised over phone to stop Metforminand return for office visit preferably in the early am.

## 2014-07-07 ENCOUNTER — Encounter: Payer: Self-pay | Admitting: Medical

## 2014-07-07 NOTE — Telephone Encounter (Signed)
Patient had question on what type of diabetes he has. Last I heard he had had side effects to metformin and I asked him to stop the metformin due to side effects and follow-up in the office for new prescription of other type medication. I planned to give him a coupon with a new prescription. He did not follow-up and I got the message regarding his question as to what type of diabetes and what type of diet he should be on. So at this point I'm going to get LPN to call patient and advised some conservative measures regarding diet and inform him about type 2 diabetes. Will offer him a prescription invokana to use for month and then follow-up in a month. He couldn't combine pick up the coupon card prior to getting the medication filled.

## 2014-07-07 NOTE — Telephone Encounter (Signed)
I had asked him to come in so I could prescribe new medication since he could not tolerate metformin. He had to discontinue that do to side effects. So he recently sent me note asking me what type diabetes and what type of diet. I will get lpn to call pt and advise he to come in.

## 2014-07-08 ENCOUNTER — Encounter: Payer: Self-pay | Admitting: Medical

## 2014-07-08 NOTE — Progress Notes (Unsigned)
Called patient regarding diabetic medication change. Left message for him to call me. Have asked patient to call before but has not called.

## 2014-07-08 NOTE — Telephone Encounter (Signed)
I tried to call pt but he was not available.

## 2014-07-09 ENCOUNTER — Ambulatory Visit (INDEPENDENT_AMBULATORY_CARE_PROVIDER_SITE_OTHER): Payer: No Typology Code available for payment source | Admitting: Medical

## 2014-07-09 ENCOUNTER — Ambulatory Visit: Payer: No Typology Code available for payment source | Admitting: Medical

## 2014-07-09 ENCOUNTER — Encounter: Payer: Self-pay | Admitting: Medical

## 2014-07-09 VITALS — BP 119/85 | HR 75 | Temp 98.3°F | Ht 69.5 in | Wt 199.8 lb

## 2014-07-09 DIAGNOSIS — R748 Abnormal levels of other serum enzymes: Secondary | ICD-10-CM

## 2014-07-09 DIAGNOSIS — E119 Type 2 diabetes mellitus without complications: Secondary | ICD-10-CM

## 2014-07-09 HISTORY — DX: Type 2 diabetes mellitus without complications: E11.9

## 2014-07-09 NOTE — Patient Instructions (Addendum)
For your diabetes continue with your low sugar diet and although you are active at work if possible on slower days try to walk 15-30 minutes a day.   Continue metformin daily.  In February around 25th or little later. I want to get lipid panel fasting, a1-c and cmp.   Then will follow up in march.  Rx of glucometer, test strips and lancets. Given.

## 2014-07-09 NOTE — Progress Notes (Signed)
Pre visit review using our clinic review tool, if applicable. No additional management support is needed unless otherwise documented below in the visit note. 

## 2014-07-09 NOTE — Assessment & Plan Note (Signed)
For your diabetes continue with your low sugar diet and although you are active at work if possible on slower days try to walk 15-30 minutes a day.   Continue metformin daily.  In February around 25th or little later. I want to get lipid panel fasting, a1-c and cmp.   Then will follow up in march.  Rx of glucometer, test strips and lancets. Given.

## 2014-07-09 NOTE — Progress Notes (Signed)
Subjective:    Patient ID: Alec [name], male    DOB: 1956/04/26, 58 y.o.   MRN: [id number]  HPI   Pt in for follow up on his diabetes. Pt states brief side effect possible at onset of taking metformin. Some loose stools but maybe related to some food he ate. Now he is taking and no side. Pt cut back a lot on his portions and types of foods. Pt has researched foods with high glycemic indexes. Pt has lost weight. Pt walks a lot at his work.  Past Medical History  Diagnosis Date  . ALLERGIC RHINITIS   . DIVERTICULITIS, HX OF   . GERD   . LEG PAIN, LEFT   . SINUSITIS, CHRONIC   . SMOKER   . Allergy     History   Social History  . Marital Status: Married    Spouse Name: N/A    Number of Children: N/A  . Years of Education: N/A   Occupational History  . Not on file.   Social History Main Topics  . Smoking status: Current Every Day Smoker -- 1.50 packs/day for 40 years  . Smokeless tobacco: Not on file     Comment: Married, lives with wife (MM dx 2009)  . Alcohol Use: No  . Drug Use: No  . Sexual Activity: Yes   Other Topics Concern  . 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 Spine surgery      lumbar surgery 2012.    Family History  Problem Relation Age of Onset  . Alzheimer's disease Mother   . Heart disease Father   . Parkinsonism Father   . Alcohol abuse Brother     No Known Allergies  Current Outpatient Prescriptions on File Prior to Visit  Medication Sig Dispense Refill  . acetaminophen (TYLENOL) 500 MG tablet Take 500 mg by mouth as needed.      Marland Kitchen buPROPion (WELLBUTRIN SR) 150 MG 12 hr tablet Take 1 tablet (150 mg total) by mouth 2 (two) times daily. 63 tablet 1  . diclofenac (VOLTAREN) 75 MG EC tablet Take 1 tablet (75 mg total) by mouth 2 (two) times daily. 30 tablet 0  . fluticasone (FLONASE) 50 MCG/ACT nasal spray Place 1 spray into the nose  every morning.      . loratadine-pseudoephedrine (CLARITIN-D 12-HOUR) 5-120 MG per tablet Take 1 tablet by mouth every morning.      . metFORMIN (GLUCOPHAGE) 500 MG tablet Take 1 tablet (500 mg total) by mouth 2 (two) times daily with a meal. 60 tablet 3  . omeprazole (PRILOSEC) 20 MG capsule Take 20 mg by mouth daily.       No current facility-administered medications on file prior to visit.    BP 119/85 mmHg  Pulse 75  Temp(Src) 98.3 F (36.8 C) (Oral)  Ht 5' 9.5" (1.765 m)  Wt 199 lb 12.8 oz (90.629 kg)  BMI 29.09 kg/m2  SpO2 97%        Review of Systems  Constitutional: Negative for fever, chills and fatigue.  HENT: Negative for congestion, ear discharge, ear pain, nosebleeds, postnasal drip, rhinorrhea, sinus pressure, sneezing, sore throat and trouble swallowing.   Respiratory: Negative for cough, chest tightness, shortness of breath and wheezing.   Cardiovascular: Negative for chest pain and palpitations.  Gastrointestinal: Negative for nausea, abdominal pain, diarrhea and rectal pain.  Endocrine: Negative for  polydipsia, polyphagia and polyuria.  Musculoskeletal: Negative for back pain.  Skin: Negative.   Neurological: Negative for dizziness, tremors, seizures, syncope, facial asymmetry, weakness, light-headedness, numbness and headaches.  Hematological: Negative for adenopathy. Does not bruise/bleed easily.  Psychiatric/Behavioral: Negative for suicidal ideas, behavioral problems, self-injury and dysphoric mood. The patient is not nervous/anxious.        Objective:   Physical Exam   General- No acute distress. Pleasant patient. Neck- Full range of motion, no jvd no carotid bruits. Lungs- Clear, even and unlabored. Heart- regular rate and rhythm. Neurologic- CNII- XII grossly intact. Lower extremity-feet- no lesions, no ulcers. No breakdown, normal sensation monofilament and normal pulses.         Assessment & Plan:

## 2014-07-10 ENCOUNTER — Telehealth: Payer: Self-pay | Admitting: Medical

## 2014-07-10 NOTE — Telephone Encounter (Signed)
emmi emailed °

## 2014-07-30 ENCOUNTER — Telehealth: Payer: Self-pay

## 2014-07-30 DIAGNOSIS — R739 Hyperglycemia, unspecified: Secondary | ICD-10-CM

## 2014-07-30 MED ORDER — GLUCOSE BLOOD VI STRP: ORAL_STRIP | Status: DC

## 2014-07-30 MED ORDER — ONETOUCH DELICA LANCETS 33G MISC: Status: AC

## 2014-07-30 NOTE — Telephone Encounter (Signed)
Patient presents to the office for help in using his new glucose meter. Patient states that he read the directions but has never done anything like this before. Reviewed process with patient and performed glucose check. Patient verbalizes understanding of the procedure. Patient also needs Rx for supplies to be sent to [facility] for the [employer]. Rx sent.

## 2014-08-13 ENCOUNTER — Telehealth: Payer: Self-pay | Admitting: Medical

## 2014-08-13 ENCOUNTER — Other Ambulatory Visit: Payer: Self-pay

## 2014-08-13 DIAGNOSIS — R739 Hyperglycemia, unspecified: Secondary | ICD-10-CM

## 2014-08-13 MED ORDER — GLUCOSE BLOOD VI STRP: ORAL_STRIP | Status: AC

## 2014-08-13 NOTE — Telephone Encounter (Signed)
Caller name: Elwood, Bazinet Relation to pt: self  Call back number: [phone] Pharmacy:  Reason for call:  Pt requesting a refill of glucose blood (ONETOUCH VERIO) test strip

## 2014-08-13 NOTE — Telephone Encounter (Signed)
Called test strips in to pharmacy. Patient notified.

## 2014-08-13 NOTE — Telephone Encounter (Deleted)
Called medication

## 2014-09-08 ENCOUNTER — Other Ambulatory Visit (INDEPENDENT_AMBULATORY_CARE_PROVIDER_SITE_OTHER): Payer: No Typology Code available for payment source

## 2014-09-08 DIAGNOSIS — R748 Abnormal levels of other serum enzymes: Secondary | ICD-10-CM

## 2014-09-08 DIAGNOSIS — E119 Type 2 diabetes mellitus without complications: Secondary | ICD-10-CM

## 2014-09-08 LAB — LIPID PANEL
Cholesterol: 126 mg/dL (ref 0–200)
HDL: 33.3 mg/dL — ABNORMAL LOW (ref >39.00)
LDL Cholesterol: 77 mg/dL (ref 0–99)
NonHDL: 92.7
Total CHOL/HDL Ratio: 4
Triglycerides: 81 mg/dL (ref 0.0–149.0)
VLDL: 16.2 mg/dL (ref 0.0–40.0)

## 2014-09-08 LAB — COMPREHENSIVE METABOLIC PANEL
ALT: 18 U/L (ref 0–53)
AST: 17 U/L (ref 0–37)
Albumin: 4.7 g/dL (ref 3.5–5.2)
Alkaline Phosphatase: 54 U/L (ref 39–117)
BUN: 19 mg/dL (ref 6–23)
CO2: 29 mEq/L (ref 19–32)
Calcium: 9.8 mg/dL (ref 8.4–10.5)
Chloride: 104 mEq/L (ref 96–112)
Creatinine, Ser: 0.9 mg/dL (ref 0.40–1.50)
GFR: 92.02 mL/min (ref >60.00)
Glucose, Bld: 121 mg/dL — ABNORMAL HIGH (ref 70–99)
Potassium: 4.4 mEq/L (ref 3.5–5.1)
Sodium: 140 mEq/L (ref 135–145)
Total Bilirubin: 0.6 mg/dL (ref 0.2–1.2)
Total Protein: 7.3 g/dL (ref 6.0–8.3)

## 2014-09-08 LAB — HEMOGLOBIN A1C: Hgb A1c MFr Bld: 5.8 % (ref 4.6–6.5)

## 2014-09-09 ENCOUNTER — Other Ambulatory Visit: Payer: Self-pay

## 2014-09-09 MED ORDER — METFORMIN HCL 500 MG PO TABS: 500.0000 mg | ORAL_TABLET | Freq: Two times a day (BID) | ORAL | Status: DC

## 2014-09-12 ENCOUNTER — Ambulatory Visit: Payer: No Typology Code available for payment source | Admitting: Medical

## 2014-09-16 ENCOUNTER — Ambulatory Visit: Payer: No Typology Code available for payment source | Admitting: Medical

## 2014-10-10 ENCOUNTER — Ambulatory Visit (INDEPENDENT_AMBULATORY_CARE_PROVIDER_SITE_OTHER): Payer: No Typology Code available for payment source | Admitting: Medical

## 2014-10-10 ENCOUNTER — Ambulatory Visit (HOSPITAL_BASED_OUTPATIENT_CLINIC_OR_DEPARTMENT_OTHER)
Admission: RE | Admit: 2014-10-10 | Discharge: 2014-10-10 | Disposition: A | Discharge status: Home / Self Care | Payer: No Typology Code available for payment source | Source: Ambulatory Visit | Attending: Medical | Admitting: Medical

## 2014-10-10 ENCOUNTER — Encounter: Payer: Self-pay | Admitting: Medical

## 2014-10-10 VITALS — BP 120/73 | HR 71 | Temp 98.0°F | Ht 70.0 in | Wt 182.6 lb

## 2014-10-10 DIAGNOSIS — R05 Cough: Secondary | ICD-10-CM

## 2014-10-10 DIAGNOSIS — E119 Type 2 diabetes mellitus without complications: Secondary | ICD-10-CM | POA: Diagnosis not present

## 2014-10-10 DIAGNOSIS — F17210 Nicotine dependence, cigarettes, uncomplicated: Secondary | ICD-10-CM | POA: Diagnosis not present

## 2014-10-10 DIAGNOSIS — F17200 Nicotine dependence, unspecified, uncomplicated: Secondary | ICD-10-CM

## 2014-10-10 DIAGNOSIS — R059 Cough, unspecified: Secondary | ICD-10-CM

## 2014-10-10 DIAGNOSIS — Z720 Tobacco use: Secondary | ICD-10-CM

## 2014-10-10 DIAGNOSIS — R200 Anesthesia of skin: Secondary | ICD-10-CM

## 2014-10-10 DIAGNOSIS — R748 Abnormal levels of other serum enzymes: Secondary | ICD-10-CM | POA: Diagnosis not present

## 2014-10-10 HISTORY — DX: Anesthesia of skin: R20.0

## 2014-10-10 HISTORY — DX: Abnormal levels of other serum enzymes: R74.8

## 2014-10-10 NOTE — Progress Notes (Signed)
Pre visit review using our clinic review tool, if applicable. No additional management support is needed unless otherwise documented below in the visit note. 

## 2014-10-10 NOTE — Assessment & Plan Note (Signed)
Exercise. Repeat lipid in Dec 07, 2014.

## 2014-10-10 NOTE — Assessment & Plan Note (Signed)
With occasional cough. Will get cxr today. Please start wellbutrin.

## 2014-10-10 NOTE — Patient Instructions (Signed)
Diabetes Doing very well. Keep up good work. Get a1-c Dec 07, 2014. Continue metformin.   SMOKER With occasional cough. Will get cxr today. Please start wellbutrin.   Low serum HDL Exercise. Repeat lipid in Dec 07, 2014.   Numbness of finger Atypical somewhat. Increase with pressure base of digit. Refer to hand specialist.     Follow up January 07, 2015 or as needed.

## 2014-10-10 NOTE — Progress Notes (Signed)
Subjective:    Patient ID: Alec [name], male    DOB: 08-Jul-1956, 59 y.o.   MRN: [id number]  HPI  Pt in states feeling good. Pt lost weight from 202 to 182. Pt states he has stopped eating sugar. He is very compliant on diet. He is walking a lot at work. He will start officially walking daily. A1-c a month ago was 5.8. Also compliant on med/metformin. No side effects. Recent bs checks have been controlled.   Pt lipid looked good in past only good cholesterol low. Pt made aware exercise may increase.  Pt bp is 120/73. Is in control. Has been under control.  Pt does want to smoke now. Pt has not started wellbutrin. Bottle sitting in his drawer. He indicates will now start.  Pt has some mild faint numbness at very tip of rt  2nd digit since December. Now faint numbness medial finger. No neck pain, no cts symptoms. If pushes at base of finger will tingle more.  Occasional coughing and he attribute to smoking. But no infections type symptoms presently.    Review of Systems  Constitutional: Negative for fever, chills, diaphoresis, activity change and fatigue.  Respiratory: Negative for cough, chest tightness and shortness of breath.   Cardiovascular: Negative for chest pain, palpitations and leg swelling.  Gastrointestinal: Negative for nausea, vomiting and abdominal pain.  Endocrine: Negative for polydipsia, polyphagia and polyuria.  Musculoskeletal: Negative for neck pain and neck stiffness.  Neurological: Positive for numbness. Negative for dizziness, tremors, seizures, syncope, facial asymmetry, speech difficulty, weakness, light-headedness and headaches.       See hop. Regarding the finger numbness  Hematological: Negative for adenopathy. Does not bruise/bleed easily.  Psychiatric/Behavioral: Negative for behavioral problems, confusion and agitation. The patient is not nervous/anxious.    Past Medical History  Diagnosis Date  . ALLERGIC RHINITIS   . DIVERTICULITIS, HX OF     . GERD   . LEG PAIN, LEFT   . SINUSITIS, CHRONIC   . SMOKER   . Allergy     History   Social History  . Marital Status: Married    Spouse Name: N/A  . Number of Children: N/A  . Years of Education: N/A   Occupational History  . Not on file.   Social History Main Topics  . Smoking status: Current Every Day Smoker -- 1.50 packs/day for 40 years  . Smokeless tobacco: Not on file     Comment: Married, lives with wife (MM dx 2009)  . Alcohol Use: No  . Drug Use: No  . Sexual Activity: Yes   Other Topics Concern  . 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 Spine surgery      lumbar surgery 2012.    Family History  Problem Relation Age of Onset  . Alzheimer's disease Mother   . Heart disease Father   . Parkinsonism Father   . Alcohol abuse Brother     No Known Allergies  Current Outpatient Prescriptions on File Prior to Visit  Medication Sig Dispense Refill  . acetaminophen (TYLENOL) 500 MG tablet Take 500 mg by mouth as needed.      Marland Kitchen glucose blood (ONETOUCH VERIO) test strip Use as instructed 100 each 12  . metFORMIN (GLUCOPHAGE) 500 MG tablet Take 1 tablet (500 mg total) by mouth 2 (two) times daily with a meal. 60 tablet 3  . omeprazole (  PRILOSEC) 20 MG capsule Take 20 mg by mouth daily.      Glory Rosebush DELICA LANCETS 91B MISC Use as directed. 100 each 12  . buPROPion (WELLBUTRIN SR) 150 MG 12 hr tablet Take 1 tablet (150 mg total) by mouth 2 (two) times daily. 63 tablet 1   No current facility-administered medications on file prior to visit.    BP 120/73 mmHg  Pulse 71  Temp(Src) 98 F (36.7 C) (Oral)  Ht 5\' 10"  (1.778 m)  Wt 182 lb 9.6 oz (82.827 kg)  BMI 26.20 kg/m2  SpO2 100%      Objective:   Physical Exam  General Mental Status- Alert. General Appearance- Not in acute distress.   Skin General: Color- Normal Color. Moisture- Normal  Moisture.  Neck Carotid Arteries- Normal color. Moisture- Normal Moisture. No carotid bruits. No JVD.  Chest and Lung Exam Auscultation: Breath Sounds:-Normal.  Cardiovascular Auscultation:Rythm- Regular. Murmurs & Other Heart Sounds:Auscultation of the heart reveals- No Murmurs.  Abdomen Inspection:-Inspeection Normal. Palpation/Percussion:Note:No mass. Palpation and Percussion of the abdomen reveal- Non Tender, Non Distended + BS, no rebound or guarding.    Neurologic Cranial Nerve exam:- CN III-XII intact(No nystagmus), symmetric smile. Drift Test:- No drift. Romberg Exam:- Negative.  Heal to Toe Gait exam:-Normal. Finger to Nose:- Normal/Intact Strength:- 5/5 equal and symmetric strength both upper and lower extremities.  Rt finger- faint numbness medial aspect of rt 2nd digit. Rt hand/wrist- negative phalens sign. See lower ext- quality metrics.      Assessment & Plan:

## 2014-10-10 NOTE — Assessment & Plan Note (Signed)
Doing very well. Keep up good work. Get a1-c Dec 07, 2014. Continue metformin.

## 2014-10-10 NOTE — Assessment & Plan Note (Signed)
Atypical somewhat. Increase with pressure base of digit. Refer to hand specialist.

## 2014-10-21 ENCOUNTER — Encounter: Payer: Self-pay | Admitting: Gastroenterology

## 2014-12-09 ENCOUNTER — Telehealth: Payer: Self-pay | Admitting: Medical

## 2014-12-09 MED ORDER — METFORMIN HCL 500 MG PO TABS: 500.0000 mg | ORAL_TABLET | Freq: Two times a day (BID) | ORAL | Status: DC

## 2014-12-09 NOTE — Telephone Encounter (Signed)
Relation to pt: self  Call back number: [phone] Pharmacy: [facility] [place], [place]  Reason for call:  Pt requesting a refill metFORMIN (GLUCOPHAGE) 500 MG tablet

## 2015-01-05 ENCOUNTER — Other Ambulatory Visit: Payer: Self-pay

## 2015-01-16 ENCOUNTER — Other Ambulatory Visit: Payer: No Typology Code available for payment source

## 2015-01-16 ENCOUNTER — Ambulatory Visit: Payer: No Typology Code available for payment source | Admitting: Medical

## 2015-01-30 ENCOUNTER — Ambulatory Visit: Payer: No Typology Code available for payment source | Admitting: Medical

## 2015-02-06 ENCOUNTER — Other Ambulatory Visit (INDEPENDENT_AMBULATORY_CARE_PROVIDER_SITE_OTHER): Payer: 59

## 2015-02-06 DIAGNOSIS — R748 Abnormal levels of other serum enzymes: Secondary | ICD-10-CM | POA: Diagnosis not present

## 2015-02-06 DIAGNOSIS — E119 Type 2 diabetes mellitus without complications: Secondary | ICD-10-CM | POA: Diagnosis not present

## 2015-02-06 LAB — LIPID PANEL
Cholesterol: 135 mg/dL (ref 0–200)
HDL: 41.8 mg/dL (ref >39.00)
LDL Cholesterol: 78 mg/dL (ref 0–99)
NonHDL: 93
Total CHOL/HDL Ratio: 3
Triglycerides: 77 mg/dL (ref 0.0–149.0)
VLDL: 15.4 mg/dL (ref 0.0–40.0)

## 2015-02-06 LAB — COMPREHENSIVE METABOLIC PANEL
ALT: 16 U/L (ref 0–53)
AST: 18 U/L (ref 0–37)
Albumin: 4.9 g/dL (ref 3.5–5.2)
Alkaline Phosphatase: 57 U/L (ref 39–117)
BUN: 16 mg/dL (ref 6–23)
CO2: 32 mEq/L (ref 19–32)
Calcium: 9.8 mg/dL (ref 8.4–10.5)
Chloride: 100 mEq/L (ref 96–112)
Creatinine, Ser: 0.92 mg/dL (ref 0.40–1.50)
GFR: 89.59 mL/min (ref >60.00)
Glucose, Bld: 110 mg/dL — ABNORMAL HIGH (ref 70–99)
Potassium: 4 mEq/L (ref 3.5–5.1)
Sodium: 138 mEq/L (ref 135–145)
Total Bilirubin: 0.9 mg/dL (ref 0.2–1.2)
Total Protein: 7.6 g/dL (ref 6.0–8.3)

## 2015-02-06 LAB — HEMOGLOBIN A1C: Hgb A1c MFr Bld: 5.6 % (ref 4.6–6.5)

## 2015-02-13 ENCOUNTER — Ambulatory Visit: Payer: Self-pay | Admitting: Medical

## 2015-02-18 ENCOUNTER — Encounter: Payer: Self-pay | Admitting: Gastroenterology

## 2015-02-27 ENCOUNTER — Encounter: Payer: Self-pay | Admitting: Medical

## 2015-02-27 ENCOUNTER — Ambulatory Visit (INDEPENDENT_AMBULATORY_CARE_PROVIDER_SITE_OTHER): Payer: 59 | Admitting: Medical

## 2015-02-27 VITALS — BP 122/78 | HR 87 | Temp 98.1°F | Resp 16 | Ht 70.0 in | Wt 180.0 lb

## 2015-02-27 DIAGNOSIS — F17200 Nicotine dependence, unspecified, uncomplicated: Secondary | ICD-10-CM

## 2015-02-27 DIAGNOSIS — E119 Type 2 diabetes mellitus without complications: Secondary | ICD-10-CM

## 2015-02-27 DIAGNOSIS — Z720 Tobacco use: Secondary | ICD-10-CM | POA: Diagnosis not present

## 2015-02-27 MED ORDER — METFORMIN HCL 500 MG PO TABS: 500.0000 mg | ORAL_TABLET | Freq: Two times a day (BID) | ORAL | Status: DC

## 2015-02-27 NOTE — Progress Notes (Signed)
Pre visit review using our clinic review tool, if applicable. No additional management support is needed unless otherwise documented below in the visit note. 

## 2015-02-27 NOTE — Patient Instructions (Addendum)
Continue metformin. Rx given. Doing well.  Encourage trying wellbutrin to help stop smoking.  Please set up your colonoscopy and eye exam(Diabetic).   Please get labs early November.   Appointment follow up to be determined after labs back.

## 2015-02-27 NOTE — Progress Notes (Signed)
Subjective:    Patient ID: Alec [name], male    DOB: 11-26-1955, 59 y.o.   MRN: [id number]  HPI  Pt in for follow up. His blood pressure.  Pt pressure is good today 122/78. No cardiac or neurologic signs or symptoms.  Pt a1-c is 5.6.  Pt is compliant on his medications. Healthy lifestyle.   Pt is a smoker. Pt has wellbutrin rx. But has not filled yet. But he may start soon.  Pt is aware that he needs colonoscopy. [facility]. Pt also  he knows eye exam for his diabetes.    Review of Systems  Constitutional: Negative for fever, chills, diaphoresis, activity change and fatigue.  Respiratory: Negative for cough, chest tightness, shortness of breath and wheezing.   Cardiovascular: Negative for chest pain, palpitations and leg swelling.  Gastrointestinal: Negative for nausea, vomiting and abdominal pain.  Musculoskeletal: Negative for neck pain and neck stiffness.  Neurological: Negative for dizziness, tremors, seizures, syncope, facial asymmetry, speech difficulty, weakness, light-headedness, numbness and headaches.  Psychiatric/Behavioral: Negative for behavioral problems, confusion and agitation. The patient is not nervous/anxious.     Past Medical History  Diagnosis Date  . ALLERGIC RHINITIS   . DIVERTICULITIS, HX OF   . GERD   . LEG PAIN, LEFT   . SINUSITIS, CHRONIC   . SMOKER   . Allergy     Social History   Social History  . Marital Status: Married    Spouse Name: N/A  . Number of Children: N/A  . Years of Education: N/A   Occupational History  . Not on file.   Social History Main Topics  . Smoking status: Current Every Day Smoker -- 1.50 packs/day for 40 years  . Smokeless tobacco: Not on file     Comment: Married, lives with wife (MM dx 2009)  . Alcohol Use: No  . Drug Use: No  . Sexual Activity: Yes   Other Topics Concern  . 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 Spine surgery      lumbar surgery 2012.    Family History  Problem Relation Age of Onset  . Alzheimer's disease Mother   . Heart disease Father   . Parkinsonism Father   . Alcohol abuse Brother     No Known Allergies  Current Outpatient Prescriptions on File Prior to Visit  Medication Sig Dispense Refill  . acetaminophen (TYLENOL) 500 MG tablet Take 500 mg by mouth as needed.      Marland Kitchen glucose blood (ONETOUCH VERIO) test strip Use as instructed 100 each 12  . metFORMIN (GLUCOPHAGE) 500 MG tablet Take 1 tablet (500 mg total) by mouth 2 (two) times daily with a meal. 60 tablet 3  . omeprazole (PRILOSEC) 20 MG capsule Take 20 mg by mouth daily.      Glory Rosebush DELICA LANCETS 70Y MISC Use as directed. 100 each 12  . buPROPion (WELLBUTRIN SR) 150 MG 12 hr tablet Take 1 tablet (150 mg total) by mouth 2 (two) times daily. (Patient not taking: Reported on 02/27/2015) 63 tablet 1   No current facility-administered medications on file prior to visit.    BP 122/78 mmHg  Pulse 87  Temp(Src) 98.1 F (36.7 C) (Oral)  Resp 16  Ht 5\' 10"  (1.778 m)  Wt 180 lb (81.647 kg)  BMI 25.83 kg/m2  SpO2 97%  Objective:   Physical Exam  General Mental Status- Alert. General Appearance- Not in acute distress.   Skin General: Color- Normal Color. Moisture- Normal Moisture.    Chest and Lung Exam Auscultation: Breath Sounds:-Normal. CTA.  Cardiovascular Auscultation:Rythm- Regular, Rate and Rhythm. Murmurs & Other Heart Sounds:Auscultation of the heart reveals- No Murmurs.   Neurologic Cranial Nerve exam:- CN III-XII intact(No nystagmus), symmetric smile. Strength:- 5/5 equal and symmetric strength both upper and lower extremities.     Assessment & Plan:  Continue metformin. Rx given. Doing well.  Encourage trying wellbutrin to help stop smoking.  Please set up your colonoscopy and eye exam(Diabetic).   Please get labs early November.

## 2015-03-04 ENCOUNTER — Telehealth: Payer: Self-pay | Admitting: Medical

## 2015-03-04 NOTE — Telephone Encounter (Signed)
Spoke with pt and he voices understanding. Pt states he left a urine sample last Friday. Will check into this. Pt has future labs in November.

## 2015-03-04 NOTE — Telephone Encounter (Signed)
Will you call pt to remind him to get urine micro albumin. The order is computer. He can also give sample when in next time for his a1-c.

## 2015-03-05 ENCOUNTER — Telehealth: Payer: Self-pay | Admitting: Medical

## 2015-03-05 NOTE — Telephone Encounter (Signed)
Pt sent over for urine microalbumin. But lab did uds. I showed Shanon Brow in lab and pointed out to pt so he does not get charged.

## 2015-03-05 NOTE — Telephone Encounter (Signed)
Spoke with pt and he voices understanding. He will come in and give a urine sample in November.

## 2015-05-15 ENCOUNTER — Ambulatory Visit: Payer: 59 | Admitting: Medical

## 2015-06-18 ENCOUNTER — Other Ambulatory Visit (INDEPENDENT_AMBULATORY_CARE_PROVIDER_SITE_OTHER): Payer: 59

## 2015-06-18 DIAGNOSIS — E119 Type 2 diabetes mellitus without complications: Secondary | ICD-10-CM | POA: Diagnosis not present

## 2015-06-18 LAB — HEMOGLOBIN A1C: Hgb A1c MFr Bld: 5.8 % (ref 4.6–6.5)

## 2015-06-18 LAB — COMPREHENSIVE METABOLIC PANEL
ALT: 18 U/L (ref 0–53)
AST: 15 U/L (ref 0–37)
Albumin: 4.7 g/dL (ref 3.5–5.2)
Alkaline Phosphatase: 51 U/L (ref 39–117)
BUN: 15 mg/dL (ref 6–23)
CO2: 33 mEq/L — ABNORMAL HIGH (ref 19–32)
Calcium: 9.9 mg/dL (ref 8.4–10.5)
Chloride: 101 mEq/L (ref 96–112)
Creatinine, Ser: 0.87 mg/dL (ref 0.40–1.50)
GFR: 95.44 mL/min (ref >60.00)
Glucose, Bld: 109 mg/dL — ABNORMAL HIGH (ref 70–99)
Potassium: 4.9 mEq/L (ref 3.5–5.1)
Sodium: 141 mEq/L (ref 135–145)
Total Bilirubin: 0.9 mg/dL (ref 0.2–1.2)
Total Protein: 7.1 g/dL (ref 6.0–8.3)

## 2015-06-25 ENCOUNTER — Ambulatory Visit: Payer: 59 | Admitting: Medical

## 2015-06-29 ENCOUNTER — Ambulatory Visit (INDEPENDENT_AMBULATORY_CARE_PROVIDER_SITE_OTHER): Payer: 59 | Admitting: Medical

## 2015-06-29 ENCOUNTER — Encounter: Payer: Self-pay | Admitting: Medical

## 2015-06-29 VITALS — BP 118/72 | HR 81 | Temp 97.7°F | Ht 70.0 in | Wt 185.4 lb

## 2015-06-29 DIAGNOSIS — Z720 Tobacco use: Secondary | ICD-10-CM

## 2015-06-29 DIAGNOSIS — E119 Type 2 diabetes mellitus without complications: Secondary | ICD-10-CM | POA: Diagnosis not present

## 2015-06-29 DIAGNOSIS — Z8669 Personal history of other diseases of the nervous system and sense organs: Secondary | ICD-10-CM

## 2015-06-29 DIAGNOSIS — Z86010 Personal history of colonic polyps: Secondary | ICD-10-CM | POA: Diagnosis not present

## 2015-06-29 DIAGNOSIS — J309 Allergic rhinitis, unspecified: Secondary | ICD-10-CM

## 2015-06-29 DIAGNOSIS — F17200 Nicotine dependence, unspecified, uncomplicated: Secondary | ICD-10-CM

## 2015-06-29 DIAGNOSIS — Z0100 Encounter for examination of eyes and vision without abnormal findings: Secondary | ICD-10-CM

## 2015-06-29 LAB — MICROALBUMIN, URINE: Microalb, Ur: <0.2 mg/dL

## 2015-06-29 MED ORDER — METFORMIN HCL 500 MG PO TABS: 500.0000 mg | ORAL_TABLET | Freq: Two times a day (BID) | ORAL | Status: DC

## 2015-06-29 MED ORDER — METFORMIN HCL 500 MG PO TABS
500.0000 mg | ORAL_TABLET | Freq: Two times a day (BID) | ORAL | Status: DC
Start: 2015-06-29 — End: 2016-03-22

## 2015-06-29 MED ORDER — FLUTICASONE PROPIONATE 50 MCG/ACT NA SUSP: 2.0000 | Freq: Every day | NASAL | Status: DC

## 2015-06-29 NOTE — Progress Notes (Signed)
Pre visit review using our clinic review tool, if applicable. No additional management support is needed unless otherwise documented below in the visit note. 

## 2015-06-29 NOTE — Patient Instructions (Addendum)
Last a1-c 5.8. Continue metformin(refill rx). Get  Urine micro albumin today.  Refer for diabetic eye exam.  Refer for colonosocpy.  Please consider stopping smoking and trying the wellbutrin.   Will rx flonase. You may have allergic rhinitis vs vasomotor rhinitis. If you get sinus pressure or thick mucous.   Follow up in 3 months or as needed.

## 2015-06-29 NOTE — Progress Notes (Signed)
   Subjective:    Patient ID: Alec [name], male    DOB: [birth year], 59 y.o.   MRN: [id number]  HPI  Pt here for follow up on his diabetes.  Will do foot exam today.  Refer for diabetic eye exam.  Order urine micro ablumin. Offer and advise both flu vaccine and pcv 13.(Pt declined both despite counseling on benefit and risk).  He is due for a1-c today.  Pt does continue to smoke. Advise against and rx'd wellbutrin. He did not take and continue to smoke.  Pt states he has been compliant mostly on diabetic diet and meds. His a1-c was 5.8. Compliant on metformin.    Pt nasal congestion. He has chronically for years. He thinks related to smoking and winter. He states sneeze a lot and runny nose.Clear drainage know. He uses some otc spray but no recent steroid nasal spray. Pt saw specialist who mentioned he may need sinus surgery. Pt declined surgery.    Review of Systems  Constitutional: Negative for fever, chills and fatigue.  HENT: Positive for congestion, rhinorrhea and sneezing. Negative for facial swelling, sinus pressure, sore throat and tinnitus.   Respiratory: Negative for cough, chest tightness, shortness of breath and wheezing.   Cardiovascular: Negative for chest pain and palpitations.  Gastrointestinal: Negative for abdominal pain.  Musculoskeletal: Negative for back pain and joint swelling.  Skin: Negative for rash.  Hematological: Negative for adenopathy. Does not bruise/bleed easily.  Psychiatric/Behavioral: Negative for behavioral problems, confusion, self-injury and dysphoric mood.       Stressed related to work.       Objective:   Physical Exam  General Mental Status- Alert. General Appearance- Not in acute distress.   Skin General: Color- Normal Color. Moisture- Normal Moisture.  Neck Carotid Arteries- Normal color. Moisture- Normal Moisture. No carotid bruits. No JVD.  Chest and Lung Exam Auscultation: Breath  Sounds:-Normal.  Cardiovascular Auscultation:Rythm- Regular. Murmurs & Other Heart Sounds:Auscultation of the heart reveals- No Murmurs.  Abdomen Inspection:-Inspeection Normal. Palpation/Percussion:Note:No mass. Palpation and Percussion of the abdomen reveal- Non Tender, Non Distended + BS, no rebound or guarding.    Neurologic Cranial Nerve exam:- CN III-XII intact(No nystagmus), symmetric smile. Strength:- 5/5 equal and symmetric strength both upper and lower extremities.  See quality metrics section on feet.     Assessment & Plan:  Last a1-c 5.8 Continue metformin(refill rx). Get  Urine micro albumin today.  Refer for diabetic eye exam.  Refer for colonosocpy.  Please consider stopping smoking and trying the wellbutrin.   Will rx flonase. You may have allergic rhinitis vs vasomotor rhinitis. If you get sinus pressure or thick mucous  Follow up in 3 months or as needed.

## 2015-07-02 ENCOUNTER — Encounter: Payer: Self-pay | Admitting: Medical

## 2015-09-05 ENCOUNTER — Encounter: Payer: Self-pay | Admitting: Medical

## 2015-10-02 ENCOUNTER — Other Ambulatory Visit (INDEPENDENT_AMBULATORY_CARE_PROVIDER_SITE_OTHER): Payer: 59

## 2015-10-02 DIAGNOSIS — E119 Type 2 diabetes mellitus without complications: Secondary | ICD-10-CM

## 2015-10-02 LAB — HEMOGLOBIN A1C: Hgb A1c MFr Bld: 6.1 % (ref 4.6–6.5)

## 2015-10-07 ENCOUNTER — Ambulatory Visit (INDEPENDENT_AMBULATORY_CARE_PROVIDER_SITE_OTHER): Payer: 59 | Admitting: Medical

## 2015-10-07 ENCOUNTER — Encounter: Payer: Self-pay | Admitting: Medical

## 2015-10-07 VITALS — BP 120/78 | HR 78 | Temp 98.1°F | Ht 70.0 in | Wt 184.0 lb

## 2015-10-07 DIAGNOSIS — E119 Type 2 diabetes mellitus without complications: Secondary | ICD-10-CM

## 2015-10-07 DIAGNOSIS — F17200 Nicotine dependence, unspecified, uncomplicated: Secondary | ICD-10-CM

## 2015-10-07 MED ORDER — BUPROPION HCL ER (SR) 150 MG PO TB12: ORAL_TABLET | ORAL | Status: DC

## 2015-10-07 NOTE — Progress Notes (Signed)
Subjective:    Patient ID: Alec [name], male    DOB: [birth year], 60 y.o.   MRN: [id number]  HPI    Pt in for follow up. His sugars are controlled. His a1-c is 6.1. Pt is only on metformin. Pt states moderate healthy diet.   Pt states he is walking daily. Steps 5,000 to 14,000.  Pt is willing to try to quit smoking. He has agreed to start wellbutrin and taper off cigarettes at 2 week mark.  Pt decline any vaccines today.Flu or pneumonia.    Review of Systems  Constitutional: Negative for fever, chills and fatigue.  Respiratory: Negative for cough, chest tightness, shortness of breath and wheezing.   Cardiovascular: Negative for chest pain and palpitations.  Musculoskeletal: Negative for back pain and gait problem.  Neurological: Negative for dizziness and headaches.  Hematological: Negative for adenopathy. Does not bruise/bleed easily.  Psychiatric/Behavioral: Negative for behavioral problems and confusion.     Past Medical History  Diagnosis Date  . ALLERGIC RHINITIS   . DIVERTICULITIS, HX OF   . GERD   . LEG PAIN, LEFT   . SINUSITIS, CHRONIC   . SMOKER   . Allergy     Social History   Social History  . Marital Status: Married    Spouse Name: N/A  . Number of Children: N/A  . Years of Education: N/A   Occupational History  . Not on file.   Social History Main Topics  . Smoking status: Current Every Day Smoker -- 1.50 packs/day for 40 years  . Smokeless tobacco: Not on file     Comment: Married, lives with wife (MM dx 2009)  . Alcohol Use: No  . Drug Use: No  . Sexual Activity: Yes   Other Topics Concern  . 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 Spine surgery      lumbar surgery 2012.    Family History  Problem Relation Age of Onset  . Alzheimer's disease Mother   . Heart disease Father   . Parkinsonism Father   . Alcohol abuse  Brother     No Known Allergies  Current Outpatient Prescriptions on File Prior to Visit  Medication Sig Dispense Refill  . acetaminophen (TYLENOL) 500 MG tablet Take 500 mg by mouth as needed.      . fluticasone (FLONASE) 50 MCG/ACT nasal spray Place 2 sprays into both nostrils daily. 16 g 3  . glucose blood (ONETOUCH VERIO) test strip Use as instructed 100 each 12  . metFORMIN (GLUCOPHAGE) 500 MG tablet Take 1 tablet (500 mg total) by mouth 2 (two) times daily with a meal. 60 tablet 3  . Multiple Vitamin (MULTIVITAMIN) capsule Take 1 capsule by mouth daily.    Marland Kitchen omeprazole (PRILOSEC) 20 MG capsule Take 20 mg by mouth daily.      Glory Rosebush DELICA LANCETS 99991111 MISC Use as directed. 100 each 12   No current facility-administered medications on file prior to visit.    BP 120/78 mmHg  Pulse 78  Temp(Src) 98.1 F (36.7 C) (Oral)  Ht 5\' 10"  (1.778 m)  Wt 184 lb (83.462 kg)  BMI 26.40 kg/m2  SpO2 96%       Objective:   Physical Exam  General Mental Status- Alert. General Appearance- Not in acute distress.   Skin General: Color- Normal Color. Moisture- Normal Moisture.  Neck Carotid Arteries- Normal  color. Moisture- Normal Moisture. No carotid bruits. No JVD.  Chest and Lung Exam Auscultation: Breath Sounds:-Normal.  Cardiovascular Auscultation:Rythm- Regular. Murmurs & Other Heart Sounds:Auscultation of the heart reveals- No Murmurs.  Abdomen Inspection:-Inspeection Normal. Palpation/Percussion:Note:No mass. Palpation and Percussion of the abdomen reveal- Non Tender, Non Distended + BS, no rebound or guarding.    Neurologic Cranial Nerve exam:- CN III-XII intact(No nystagmus), symmetric smile. Strength:- 5/5 equal and symmetric strength both upper and lower extremities.      Assessment & Plan:  Continue healthy diet and exercise. Diabetes well controlled.  Continue metformin.  Will rx wellbutrin for smoking sessation and use as advised. Taper off cigarettes  as advised.  Please schedule colonoscopy.  Follow up in 3 months or as needed

## 2015-10-07 NOTE — Progress Notes (Signed)
Pre visit review using our clinic review tool, if applicable. No additional management support is needed unless otherwise documented below in the visit note. 

## 2015-10-07 NOTE — Patient Instructions (Addendum)
Continue healthy diet and exercise. Diabetes well controlled.  Continue metformin.  Will rx wellbutrin for smoking sessation and use as advised. Taper off cigarettes as advised.  Please schedule colonoscopy.  Follow up in 3 months or as needed

## 2016-03-18 ENCOUNTER — Telehealth: Payer: Self-pay | Admitting: Medical

## 2016-03-18 NOTE — Telephone Encounter (Signed)
Caller name: Jagdish Relation to pt: self  Call back number: [phone] Pharmacy: [facility]  Reason for call: Pt came in office stating had not come back to visit the Provider since 10-07-15, pt states had some issue with health insurance  that it will not cover his visits at our office and got a bill of $429.  Pt is wanted to have some one to take care of his visit at our office with insurance ( a new copy of insurance was scan in), does not want to come back until he get this settle, pt has a big bill and does not want to add more amount to it. Pt would also like to know can provider get him a refill on metformin 500 mg. Please advise.

## 2016-03-21 NOTE — Telephone Encounter (Signed)
It states pt is coming in for labs I was actually thinking about this pt the other day wondering where he went? Had not seen him in some time? Then saw these notes about insurance and now they area resolved. Let him know would like to see him this month. But give time for labs to come back first. Then he can make appointment.

## 2016-03-21 NOTE — Telephone Encounter (Signed)
Alec [name] has been resolved patient is scheduled to come in for lab work tomorrow 03/21/16

## 2016-03-21 NOTE — Telephone Encounter (Signed)
Noted.  Karen--Please take care of refill.  Ebony---Will you assist with this?

## 2016-03-22 ENCOUNTER — Other Ambulatory Visit (INDEPENDENT_AMBULATORY_CARE_PROVIDER_SITE_OTHER): Payer: 59

## 2016-03-22 DIAGNOSIS — E119 Type 2 diabetes mellitus without complications: Secondary | ICD-10-CM | POA: Diagnosis not present

## 2016-03-22 LAB — HEMOGLOBIN A1C: Hgb A1c MFr Bld: 6 % (ref 4.6–6.5)

## 2016-03-22 LAB — COMPREHENSIVE METABOLIC PANEL
ALT: 18 U/L (ref 0–53)
AST: 15 U/L (ref 0–37)
Albumin: 4.8 g/dL (ref 3.5–5.2)
Alkaline Phosphatase: 53 U/L (ref 39–117)
BUN: 17 mg/dL (ref 6–23)
CO2: 34 mEq/L — ABNORMAL HIGH (ref 19–32)
Calcium: 9.2 mg/dL (ref 8.4–10.5)
Chloride: 101 mEq/L (ref 96–112)
Creatinine, Ser: 0.85 mg/dL (ref 0.40–1.50)
GFR: 97.78 mL/min (ref >60.00)
Glucose, Bld: 129 mg/dL — ABNORMAL HIGH (ref 70–99)
Potassium: 4.1 mEq/L (ref 3.5–5.1)
Sodium: 139 mEq/L (ref 135–145)
Total Bilirubin: 0.8 mg/dL (ref 0.2–1.2)
Total Protein: 7.2 g/dL (ref 6.0–8.3)

## 2016-03-22 MED ORDER — METFORMIN HCL 500 MG PO TABS: 500.0000 mg | ORAL_TABLET | Freq: Two times a day (BID) | ORAL | 0 refills | Status: DC

## 2016-03-22 NOTE — Telephone Encounter (Signed)
Refill metformin sent to his pharmacy.

## 2016-03-22 NOTE — Telephone Encounter (Signed)
Pt came in and completed labs. Pt would like a refill on Metformin medication.   Pharmacy: [facility] [address] [place]

## 2016-03-22 NOTE — Telephone Encounter (Signed)
Pt also scheduled appt with provider for 03/29/16 at 8:15a.

## 2016-03-23 ENCOUNTER — Encounter: Payer: Self-pay | Admitting: *Deleted

## 2016-03-23 ENCOUNTER — Telehealth: Payer: Self-pay | Admitting: Medical

## 2016-03-23 NOTE — Telephone Encounter (Signed)
Pt returned call for lab results.

## 2016-03-23 NOTE — Telephone Encounter (Signed)
Notes Recorded by Mackie Pai, PA-C on 03/22/2016 at 4:59 PM EDT Pt labs are good. Diabetes is still well controlled. Kidney function looks good. Will see on follow up coming week.  --  Pt notified of results and verbalized understanding.

## 2016-03-29 ENCOUNTER — Encounter: Payer: Self-pay | Admitting: Medical

## 2016-03-29 ENCOUNTER — Ambulatory Visit (INDEPENDENT_AMBULATORY_CARE_PROVIDER_SITE_OTHER): Payer: 59 | Admitting: Medical

## 2016-03-29 VITALS — BP 110/76 | HR 82 | Temp 97.9°F | Ht 70.0 in | Wt 185.6 lb

## 2016-03-29 DIAGNOSIS — F17200 Nicotine dependence, unspecified, uncomplicated: Secondary | ICD-10-CM

## 2016-03-29 DIAGNOSIS — E119 Type 2 diabetes mellitus without complications: Secondary | ICD-10-CM | POA: Diagnosis not present

## 2016-03-29 MED ORDER — METFORMIN HCL 500 MG PO TABS: 500.0000 mg | ORAL_TABLET | Freq: Two times a day (BID) | ORAL | 1 refills | Status: DC

## 2016-03-29 NOTE — Patient Instructions (Addendum)
Diabetes- your a1-c is 6.0 recently/well controlled. So continue low sugar diet, try to exercise daily and will refill your metformin.  You declined any vaccines today. Particularly the flu vaccine and decline pneuovaccine. If you get flu like illness in the future be soon asap 24-48 hours since treatment is effective early. Also to avoid secondary complication of pneumonia.  Colonsocpy.- Please schedule you colonoscopy. You mentioned that GI office has already contacted you.  Complete physical in 3 months.- come in fasting.  Smoking history- Continue to try to stop smoking. Declined wellbutrin in the past. Currently pack and half a day. Switching to vaping may be better option.  Follow up in 3 months or as needed

## 2016-03-29 NOTE — Progress Notes (Signed)
Subjective:    Patient ID: Alec [name], male    DOB: [birth year], 60 y.o.   MRN: [id number]  HPI  Pt in for follow up.   Pt for a while he was told by his insurance that he was out of network. He wrote to billing since he was having insurance issues. His insurance was applying office visits to his deductable. But this is now resolved.   Pt is diabetic. His a1-c was 6.0 most recently. Sugar has been controlled in the past. Pt cmp showed good gfr.  Pt states work is very stressful. He has very demanding clients. 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 He alludes to this being one of factors why he continues to smoke. He is aware should stop smoking. He declined using wellbutrin in the past.  Pt needs flu vaccine. Pt declines flu vaccine.   Pt also declines pneumo vaccine.   Pt last eye exam last done February 2017.  Pt last had psa about 2 years ago.(will get on his CPE in 3 months)    Review of Systems  Constitutional: Negative for chills, fatigue and fever.  HENT: Negative for congestion, ear discharge and ear pain.   Respiratory: Negative for cough, chest tightness, shortness of breath and wheezing.   Cardiovascular: Negative for chest pain and palpitations.  Gastrointestinal: Negative for abdominal pain.  Musculoskeletal: Negative for back pain.  Neurological: Negative for dizziness, syncope, weakness, numbness and headaches.  Hematological: Negative for adenopathy. Does not bruise/bleed easily.  Psychiatric/Behavioral: Negative for behavioral problems and confusion.     Past Medical History:  Diagnosis Date  . ALLERGIC RHINITIS   . Allergy   . DIVERTICULITIS, HX OF   . GERD   . LEG PAIN, LEFT   . SINUSITIS, CHRONIC   . SMOKER      Social History   Social History  . Marital status: Married    Spouse name: N/A  . Number of children: N/A  . Years of education: N/A   Occupational History  . Not on file.   Social History Main Topics  .  Smoking status: Current Every Day Smoker    Packs/day: 1.50    Years: 40.00  . Smokeless tobacco: Not on file     Comment: Married, lives with wife (MM dx 2009)  . Alcohol use No  . Drug use: No  . Sexual activity: Yes   Other Topics Concern  . 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 SPINE SURGERY     lumbar surgery 2012.    Family History  Problem Relation Age of Onset  . Alzheimer's disease Mother   . Heart disease Father   . Parkinsonism Father   . Alcohol abuse Brother     No Known Allergies  Current Outpatient Prescriptions on File Prior to Visit  Medication Sig Dispense Refill  . acetaminophen (TYLENOL) 500 MG tablet Take 500 mg by mouth as needed.      . fluticasone (FLONASE) 50 MCG/ACT nasal spray Place 2 sprays into both nostrils daily. 16 g 3  . glucose blood (ONETOUCH VERIO) test strip Use as instructed 100 each 12  . metFORMIN (GLUCOPHAGE) 500 MG tablet Take 1 tablet (500 mg total) by mouth 2 (two) times daily with a meal. 60 tablet 0  . Multiple Vitamin (MULTIVITAMIN) capsule Take 1 capsule by mouth daily.    Marland Kitchen  omeprazole (PRILOSEC) 20 MG capsule Take 20 mg by mouth daily.      Glory Rosebush DELICA LANCETS 99991111 MISC Use as directed. 100 each 12  . buPROPion (WELLBUTRIN SR) 150 MG 12 hr tablet 1 tab po q day for 3 days then twice daily (Patient not taking: Reported on 03/29/2016) 63 tablet 1   No current facility-administered medications on file prior to visit.     BP 110/76   Pulse 82   Temp 97.9 F (36.6 C) (Other (Comment))   Ht 5\' 10"  (1.778 m)   Wt 185 lb 9.6 oz (84.2 kg)   SpO2 97%   BMI 26.63 kg/m       Objective:   Physical Exam  General Mental Status- Alert. General Appearance- Not in acute distress.   Skin General: Color- Normal Color. Moisture- Normal Moisture.  Neck Carotid Arteries- Normal color. Moisture- Normal Moisture. No  carotid bruits. No JVD.  Chest and Lung Exam Auscultation: Breath Sounds:-Normal.  Cardiovascular Auscultation:Rythm- Regular. Murmurs & Other Heart Sounds:Auscultation of the heart reveals- No Murmurs.  Abdomen Inspection:-Inspeection Normal. Palpation/Percussion:Note:No mass. Palpation and Percussion of the abdomen reveal- Non Tender, Non Distended + BS, no rebound or guarding.   Neurologic Cranial Nerve exam:- CN III-XII intact(No nystagmus), symmetric smile. Strength:- 5/5 equal and symmetric strength both upper and lower extremities.  See feet exam. Quality metric.     Assessment & Plan:  Diabetes- your a1-c is 6.0 recently. So continue low sugar diet, try to exercise daily and will refill your metformin.  You declined any vaccines today. Particularly the flu vaccine. If you get flu like illness in the future be soon asap 24-48 hours since treatment is effective early. Also to avoid secondary complication of pneumonia.  Colonsocpy.- Please schedule you colonoscopy. You mentioned that GI office has already contacted you.  Complete physical in 3 months.- come in fasting.  Smoking history- Continue to try to stop smoking. Declined wellbutrin in the past. Currently pack and half a day. Switching to vaping may be better option.  Follow up in 3 months or as needed  Jatoya Armbrister, Percell Miller, [employer]

## 2016-06-25 NOTE — Progress Notes (Signed)
   Subjective:    Patient ID: Alec [name], male    DOB: 1955-12-31, 60 y.o.   MRN: [id number]  HPI      Review of Systems             Objective:   Physical Exam          Assessment & Plan:

## 2016-06-28 ENCOUNTER — Ambulatory Visit (INDEPENDENT_AMBULATORY_CARE_PROVIDER_SITE_OTHER): Payer: 59 | Admitting: Medical

## 2016-06-28 DIAGNOSIS — Z5329 Procedure and treatment not carried out because of patient's decision for other reasons: Secondary | ICD-10-CM

## 2016-07-13 ENCOUNTER — Encounter: Payer: Self-pay | Admitting: Medical

## 2016-07-13 ENCOUNTER — Telehealth: Payer: Self-pay | Admitting: Medical

## 2016-07-13 ENCOUNTER — Ambulatory Visit (INDEPENDENT_AMBULATORY_CARE_PROVIDER_SITE_OTHER): Payer: 59 | Admitting: Medical

## 2016-07-13 VITALS — BP 128/70 | HR 81 | Temp 98.0°F | Resp 16 | Ht 70.0 in | Wt 191.0 lb

## 2016-07-13 DIAGNOSIS — R0981 Nasal congestion: Secondary | ICD-10-CM

## 2016-07-13 DIAGNOSIS — E119 Type 2 diabetes mellitus without complications: Secondary | ICD-10-CM

## 2016-07-13 DIAGNOSIS — Z0001 Encounter for general adult medical examination with abnormal findings: Secondary | ICD-10-CM

## 2016-07-13 DIAGNOSIS — Z113 Encounter for screening for infections with a predominantly sexual mode of transmission: Secondary | ICD-10-CM

## 2016-07-13 DIAGNOSIS — M25512 Pain in left shoulder: Secondary | ICD-10-CM | POA: Diagnosis not present

## 2016-07-13 DIAGNOSIS — Z0000 Encounter for general adult medical examination without abnormal findings: Secondary | ICD-10-CM

## 2016-07-13 DIAGNOSIS — Z125 Encounter for screening for malignant neoplasm of prostate: Secondary | ICD-10-CM | POA: Diagnosis not present

## 2016-07-13 DIAGNOSIS — Z1283 Encounter for screening for malignant neoplasm of skin: Secondary | ICD-10-CM

## 2016-07-13 LAB — POC URINALSYSI DIPSTICK (AUTOMATED)
Bilirubin, UA: NEGATIVE
Blood, UA: NEGATIVE
Glucose, UA: NEGATIVE
Ketones, UA: NEGATIVE
Leukocytes, UA: NEGATIVE
Nitrite, UA: NEGATIVE
Protein, UA: NEGATIVE
Spec Grav, UA: 1.025
Urobilinogen, UA: NEGATIVE
pH, UA: 6

## 2016-07-13 LAB — LIPID PANEL
Cholesterol: 151 mg/dL (ref 0–200)
HDL: 38.4 mg/dL — ABNORMAL LOW (ref >39.00)
LDL Cholesterol: 86 mg/dL (ref 0–99)
NonHDL: 112.85
Total CHOL/HDL Ratio: 4
Triglycerides: 134 mg/dL (ref 0.0–149.0)
VLDL: 26.8 mg/dL (ref 0.0–40.0)

## 2016-07-13 LAB — CBC WITH DIFFERENTIAL/PLATELET
Basophils Absolute: 0.1 10*3/uL (ref 0.0–0.1)
Basophils Relative: 0.6 % (ref 0.0–3.0)
Eosinophils Absolute: 0 10*3/uL (ref 0.0–0.7)
Eosinophils Relative: 0.6 % (ref 0.0–5.0)
HCT: 39.9 % (ref 39.0–52.0)
Hemoglobin: 12.9 g/dL — ABNORMAL LOW (ref 13.0–17.0)
Lymphocytes Relative: 28.9 % (ref 12.0–46.0)
Lymphs Abs: 2.4 10*3/uL (ref 0.7–4.0)
MCHC: 32.3 g/dL (ref 30.0–36.0)
MCV: 63.8 fl — ABNORMAL LOW (ref 78.0–100.0)
Monocytes Absolute: 0.4 10*3/uL (ref 0.1–1.0)
Monocytes Relative: 4.5 % (ref 3.0–12.0)
Neutro Abs: 5.4 10*3/uL (ref 1.4–7.7)
Neutrophils Relative %: 65.4 % (ref 43.0–77.0)
Platelets: 217 10*3/uL (ref 150.0–400.0)
RBC: 6.25 Mil/uL — ABNORMAL HIGH (ref 4.22–5.81)
RDW: 16.1 % — ABNORMAL HIGH (ref 11.5–15.5)
WBC: 8.2 10*3/uL (ref 4.0–10.5)

## 2016-07-13 LAB — TSH: TSH: 0.79 u[IU]/mL (ref 0.35–4.50)

## 2016-07-13 LAB — HEMOGLOBIN A1C: Hgb A1c MFr Bld: 5.7 % (ref 4.6–6.5)

## 2016-07-13 LAB — PSA: PSA: 3.33 ng/mL (ref 0.10–4.00)

## 2016-07-13 MED ORDER — AZELASTINE HCL 0.1 % NA SOLN: 2.0000 | Freq: Two times a day (BID) | NASAL | 3 refills | Status: DC

## 2016-07-13 MED ORDER — DICLOFENAC SODIUM 75 MG PO TBEC: 75.0000 mg | DELAYED_RELEASE_TABLET | Freq: Two times a day (BID) | ORAL | 0 refills | Status: DC

## 2016-07-13 NOTE — Progress Notes (Signed)
Subjective:    Patient ID: Alec [name], male    DOB: 20-Feb-1956, 61 y.o.   MRN: [id number]  HPI  I have reviewed pt PMH, PSH, FH, Social History and Surgical History.  Pt here for CPE. He states only had coffee with splenda in it today.  Pt declines flu vaccine and pneumonia vaccine.  Pt decines shingles vaccine and hep c screening as well.  Pt still smoking has not started the Wellbutrin. He walks a lot at work. He has eaten less healthy over holidays  He has seen eye MD on Oct 2017. He is unsure of facility.  Pt update me that his wife multiple myeloma is back. Pt wife is now on new meds. Wife having some difficulty.  Pt does mention some left shoulder. Pain from wrestling his friends 8 yr child or from [place]. The pain is worse with movement. Pt is rt side. Pt minimal and just started last night. At rest minimal pain. No chest pain.   Also some recent nasal congestion for months. Hx of intermittent rt ear pain. Pt states winter always congested. Summer never is. Get occasional sinus and ear infection. Pt has been using vics nasal spray.   Review of Systems  Constitutional: Negative for chills, fatigue and fever.  HENT: Positive for congestion. Negative for postnasal drip, sinus pain, sinus pressure, sore throat, tinnitus and voice change.   Respiratory: Negative for cough, chest tightness, shortness of breath and wheezing.   Cardiovascular: Negative for chest pain and palpitations.  Gastrointestinal: Negative for abdominal pain.  Musculoskeletal: Negative for gait problem.       Lt shoulder pain. See hpi.  Skin: Negative for rash.  Neurological: Negative for dizziness, seizures, syncope, weakness, numbness and headaches.  Hematological: Negative for adenopathy.  Psychiatric/Behavioral: Negative for behavioral problems, confusion, dysphoric mood and suicidal ideas. The patient is not nervous/anxious.     Past Medical History:  Diagnosis Date  . ALLERGIC RHINITIS     . Allergy   . DIVERTICULITIS, HX OF   . GERD   . LEG PAIN, LEFT   . SINUSITIS, CHRONIC   . SMOKER      Social History   Social History  . Marital status: Married    Spouse name: N/A  . Number of children: N/A  . Years of education: N/A   Occupational History  . Not on file.   Social History Main Topics  . Smoking status: Current Every Day Smoker    Packs/day: 1.50    Years: 40.00  . Smokeless tobacco: Not on file     Comment: Married, lives with wife (MM dx 2009)  . Alcohol use No  . Drug use: No  . Sexual activity: Yes   Other Topics Concern  . 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 SPINE SURGERY     lumbar surgery 2012.    Family History  Problem Relation Age of Onset  . Alzheimer's disease Mother   . Heart disease Father   . Parkinsonism Father   . Alcohol abuse Brother     No Known Allergies  Current Outpatient Prescriptions on File Prior to Visit  Medication Sig Dispense Refill  . acetaminophen (TYLENOL) 500 MG tablet Take 500 mg by mouth as needed.      . fluticasone (FLONASE) 50 MCG/ACT nasal spray Place 2 sprays into both nostrils daily. 16 g 3  .  glucose blood (ONETOUCH VERIO) test strip Use as instructed 100 each 12  . metFORMIN (GLUCOPHAGE) 500 MG tablet Take 1 tablet (500 mg total) by mouth 2 (two) times daily with a meal. 180 tablet 1  . Multiple Vitamin (MULTIVITAMIN) capsule Take 1 capsule by mouth daily.    Marland Kitchen omeprazole (PRILOSEC) 20 MG capsule Take 20 mg by mouth daily.      Glory Rosebush DELICA LANCETS 98P MISC Use as directed. 100 each 12  . buPROPion (WELLBUTRIN SR) 150 MG 12 hr tablet 1 tab po q day for 3 days then twice daily (Patient not taking: Reported on 07/13/2016) 63 tablet 1   No current facility-administered medications on file prior to visit.     BP 128/70 (BP Location: Right Arm, Patient Position: Sitting, Cuff Size:  Large)   Pulse 81   Temp 98 F (36.7 C) (Oral)   Resp 16   Ht _0  (1.778 m)   Wt 191 lb (86.6 kg)   SpO2 98%   BMI 27.41 kg/m        Objective:   Physical Exam  General  Mental Status - Alert. General Appearance - Well groomed. Not in acute distress.  Skin Rashes- No Rashes.  HEENT Head- Normal. Ear Auditory Canal - Left- Normal. Right - Normal.Tympanic Membrane- Left- Normal. Right- Normal. Eye Sclera/Conjunctiva- Left- Normal. Right- Normal. Nose & Sinuses Nasal Mucosa- Left-  Boggy and Congested. Right-  Boggy and  Congested.Bilateral no  maxillary and  No frontal sinus pressure.(deviated septum on nose exam. Deviated to the rt.) Mouth & Throat Lips: Upper Lip- Normal: no dryness, cracking, pallor, cyanosis, or vesicular eruption. Lower Lip-Normal: no dryness, cracking, pallor, cyanosis or vesicular eruption. Buccal Mucosa- Bilateral- No Aphthous ulcers. Oropharynx- No Discharge or Erythema. Tonsils: Characteristics- Bilateral- No Erythema or Congestion. Size/Enlargement- Bilateral- No enlargement. Discharge- bilateral-None.  Neck Neck- Supple. No Masses.   Chest and Lung Exam Auscultation: Breath Sounds:-Clear even and unlabored.  Cardiovascular Auscultation:Rythm- Regular, rate and rhythm. Murmurs & Other Heart Sounds:Ausculatation of the heart reveal- No Murmurs.  Lymphatic Head & Neck General Head & Neck Lymphatics: Bilateral: Description- No Localized lymphadenopathy.   Abdomen Inspection:-Inspeection Normal. Palpation/Percussion:Note:No mass. Palpation and Percussion of the abdomen reveal- Non Tender, Non Distended + BS, no rebound or guarding.  Neurologic Cranial Nerve exam:- CN III-XII intact(No nystagmus), symmetric smile. Strength:- 5/5 equal and symmetric strength both upper and lower extremities.  Lt shoulder- good rom. But direct pain over anterior ascpect over bicep tendon. When he bring left arm across his body pain is increased.    Some on lifting arm above head as well.      Assessment & Plan:  For you wellness exam today I have ordered cbc, cmp, tsh, lipid panel, ua and hiv.  Vaccine declined today  Recommend exercise and healthy diet.  Stop smoking recommended.  We will let you know lab results as they come in.  For nasal congestion recommend stop otc nasal spray since may be afrin and can try astelin. This may be one spray you never used.(won't rx steroids as you report those never worked). If sinus pain or ear pain occur notify us and will rx antibiotic.  For your shoulder pain you can massage area and rest. Rx diclofenac. Will offer xray and sports medicine referral if pain perrists for more than 2 weeks.   Follow up date appointment will be determined after lab review.

## 2016-07-13 NOTE — Progress Notes (Signed)
Pre visit review using our clinic review tool, if applicable. No additional management support is needed unless otherwise documented below in the visit note/SLS  

## 2016-07-13 NOTE — Patient Instructions (Addendum)
For you wellness exam today I have ordered cbc, cmp, tsh, lipid panel, psa, ua and hiv.(and a1c)  Vaccine declined today  Recommend exercise and healthy diet.  Stop smoking recommended.  We will let you know lab results as they come in.  For nasal congestion recommend stop otc nasal spray since may be afrin and can try astelin. This may be one spray you never used.(won't rx steroids as you report those never worked). If sinus pain or ear pain occur notify us and will rx antibiotic.  For your shoulder pain you can massage area and rest. Rx diclofenac. Will offer xray and sports medicine referral if pain perrists for more than 2 weeks.   Follow up date appointment will be determined after lab review.

## 2016-07-14 LAB — HIV ANTIBODY (ROUTINE TESTING W REFLEX): HIV 1&2 Ab, 4th Generation: NONREACTIVE

## 2016-07-14 NOTE — Telephone Encounter (Signed)
Opened to review before calling pt and leaving message on labs.

## 2016-07-19 ENCOUNTER — Encounter: Payer: Self-pay | Admitting: *Deleted

## 2016-07-19 NOTE — Progress Notes (Signed)
Letter Mailed per provider instructions/SLS 01/09

## 2016-07-31 ENCOUNTER — Encounter: Payer: Self-pay | Admitting: Student

## 2016-10-14 ENCOUNTER — Ambulatory Visit: Payer: Self-pay | Admitting: Medical

## 2016-10-18 ENCOUNTER — Other Ambulatory Visit (INDEPENDENT_AMBULATORY_CARE_PROVIDER_SITE_OTHER): Payer: 59

## 2016-10-18 ENCOUNTER — Ambulatory Visit: Payer: 59 | Admitting: Medical

## 2016-10-18 ENCOUNTER — Telehealth: Payer: Self-pay | Admitting: Medical

## 2016-10-18 DIAGNOSIS — R748 Abnormal levels of other serum enzymes: Secondary | ICD-10-CM

## 2016-10-18 DIAGNOSIS — E119 Type 2 diabetes mellitus without complications: Secondary | ICD-10-CM

## 2016-10-18 LAB — COMPREHENSIVE METABOLIC PANEL
ALT: 18 U/L (ref 0–53)
AST: 17 U/L (ref 0–37)
Albumin: 4.7 g/dL (ref 3.5–5.2)
Alkaline Phosphatase: 52 U/L (ref 39–117)
BUN: 19 mg/dL (ref 6–23)
CO2: 32 mEq/L (ref 19–32)
Calcium: 9.6 mg/dL (ref 8.4–10.5)
Chloride: 102 mEq/L (ref 96–112)
Creatinine, Ser: 0.85 mg/dL (ref 0.40–1.50)
GFR: 97.59 mL/min (ref >60.00)
Glucose, Bld: 121 mg/dL — ABNORMAL HIGH (ref 70–99)
Potassium: 4.5 mEq/L (ref 3.5–5.1)
Sodium: 137 mEq/L (ref 135–145)
Total Bilirubin: 0.8 mg/dL (ref 0.2–1.2)
Total Protein: 7.2 g/dL (ref 6.0–8.3)

## 2016-10-18 LAB — LIPID PANEL
Cholesterol: 131 mg/dL (ref 0–200)
HDL: 34.7 mg/dL — ABNORMAL LOW (ref >39.00)
LDL Cholesterol: 80 mg/dL (ref 0–99)
NonHDL: 96.5
Total CHOL/HDL Ratio: 4
Triglycerides: 81 mg/dL (ref 0.0–149.0)
VLDL: 16.2 mg/dL (ref 0.0–40.0)

## 2016-10-18 LAB — HEMOGLOBIN A1C: Hgb A1c MFr Bld: 6.2 % (ref 4.6–6.5)

## 2016-10-18 NOTE — Telephone Encounter (Signed)
Patient has been seen and orders processed.

## 2016-10-18 NOTE — Telephone Encounter (Signed)
Labs ordered.

## 2016-10-24 ENCOUNTER — Telehealth: Payer: Self-pay | Admitting: Medical

## 2016-10-24 NOTE — Telephone Encounter (Signed)
Opened in error

## 2016-10-25 ENCOUNTER — Ambulatory Visit: Payer: 59 | Admitting: Medical

## 2016-10-28 ENCOUNTER — Ambulatory Visit (INDEPENDENT_AMBULATORY_CARE_PROVIDER_SITE_OTHER): Payer: 59 | Admitting: Medical

## 2016-10-28 VITALS — BP 125/69 | HR 70 | Temp 97.8°F | Resp 16 | Ht 72.0 in | Wt 188.0 lb

## 2016-10-28 DIAGNOSIS — E119 Type 2 diabetes mellitus without complications: Secondary | ICD-10-CM | POA: Diagnosis not present

## 2016-10-28 DIAGNOSIS — R748 Abnormal levels of other serum enzymes: Secondary | ICD-10-CM | POA: Diagnosis not present

## 2016-10-28 DIAGNOSIS — J301 Allergic rhinitis due to pollen: Secondary | ICD-10-CM

## 2016-10-28 DIAGNOSIS — F17200 Nicotine dependence, unspecified, uncomplicated: Secondary | ICD-10-CM

## 2016-10-28 MED ORDER — AZELASTINE HCL 0.1 % NA SOLN: 2.0000 | Freq: Two times a day (BID) | NASAL | 3 refills | Status: DC

## 2016-10-28 MED ORDER — METFORMIN HCL 500 MG PO TABS: 500.0000 mg | ORAL_TABLET | Freq: Two times a day (BID) | ORAL | 1 refills | Status: DC

## 2016-10-28 MED ORDER — LEVOCETIRIZINE DIHYDROCHLORIDE 5 MG PO TABS: 5.0000 mg | ORAL_TABLET | Freq: Every evening | ORAL | 1 refills | Status: DC

## 2016-10-28 NOTE — Patient Instructions (Addendum)
For diabetes continue metformin. Get back on better diet/low sugar. Exercise as well. If sugars are not coming down on follow up will need to increase metformin dose.  Reminder to stop smoking.  HDL can increase with exercise.   For allergies continue astelin. Rx xyzal.  Follow up in 3 months or as needed

## 2016-10-28 NOTE — Progress Notes (Signed)
Subjective:    Patient ID: Alec [name], male    DOB: 1956/03/11, 61 y.o.   MRN: [id number]  HPI  Pt in for follow up.  Pt admits he has been eating high sugar foods. He admits he loves sweets. Still traveling a lot. A1-c was 6.2 just recently. Pt aware need to eat less sugar.  Pt cholesterol is well controlled.  Pt cmp looked normal. Good gfr.  Pt is nasal congested. Allergies flared since in [place] working. A lot pollen exposure. Symptoms for 2 months. Pt never tried xyzal. Has tried other astihistamines.     Review of Systems  Constitutional: Negative for chills, fatigue and fever.  HENT: Positive for congestion. Negative for drooling, ear discharge, facial swelling, nosebleeds, postnasal drip, sinus pressure and sore throat.   Respiratory: Negative for cough, choking, shortness of breath and wheezing.   Cardiovascular: Negative for chest pain and palpitations.  Gastrointestinal: Negative for abdominal pain and vomiting.  Musculoskeletal: Negative for back pain and myalgias.  Neurological: Negative for dizziness, seizures, weakness, numbness and headaches.  Hematological: Negative for adenopathy. Does not bruise/bleed easily.  Psychiatric/Behavioral: Negative for behavioral problems and confusion.   Past Medical History:  Diagnosis Date  . ALLERGIC RHINITIS   . Allergy   . DIVERTICULITIS, HX OF   . GERD   . LEG PAIN, LEFT   . SINUSITIS, CHRONIC   . SMOKER      Social History   Social History  . Marital status: Married    Spouse name: N/A  . Number of children: N/A  . Years of education: N/A   Occupational History  . Not on file.   Social History Main Topics  . Smoking status: Current Every Day Smoker    Packs/day: 1.50    Years: 40.00  . Smokeless tobacco: Not on file     Comment: Married, lives with wife (MM dx 2009)  . Alcohol use No  . Drug use: No  . Sexual activity: Yes   Other Topics Concern  . 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 SPINE SURGERY     lumbar surgery 2012.    Family History  Problem Relation Age of Onset  . Alzheimer's disease Mother   . Heart disease Father   . Parkinsonism Father   . Alcohol abuse Brother     No Known Allergies  Current Outpatient Prescriptions on File Prior to Visit  Medication Sig Dispense Refill  . acetaminophen (TYLENOL) 500 MG tablet Take 500 mg by mouth as needed.      Marland Kitchen azelastine (ASTELIN) 0.1 % nasal spray Place 2 sprays into both nostrils 2 (two) times daily. Use in each nostril as directed 30 mL 3  . buPROPion (WELLBUTRIN SR) 150 MG 12 hr tablet 1 tab po q day for 3 days then twice daily 63 tablet 1  . diclofenac (VOLTAREN) 75 MG EC tablet Take 1 tablet (75 mg total) by mouth 2 (two) times daily. 30 tablet 0  . fluticasone (FLONASE) 50 MCG/ACT nasal spray Place 2 sprays into both nostrils daily. 16 g 3  . glucose blood (ONETOUCH VERIO) test strip Use as instructed 100 each 12  . metFORMIN (GLUCOPHAGE) 500 MG tablet Take 1 tablet (500 mg total) by mouth 2 (two) times daily with a meal. 180 tablet 1  . Multiple Vitamin (MULTIVITAMIN) capsule Take 1 capsule by mouth  daily.    . omeprazole (PRILOSEC) 20 MG capsule Take 20 mg by mouth daily.      Glory Rosebush DELICA LANCETS 11H MISC Use as directed. 100 each 12   No current facility-administered medications on file prior to visit.     BP 125/69 (BP Location: Left Arm, Patient Position: Sitting, Cuff Size: Large)   Pulse 70   Temp 97.8 F (36.6 C) (Oral)   Resp 16   Ht 6' (1.829 m)   Wt 188 lb (85.3 kg)   SpO2 100%   BMI 25.50 kg/m       Objective:   Physical Exam  General  Mental Status - Alert. General Appearance - Well groomed. Not in acute distress.  Skin Rashes- No Rashes.  HEENT Head- Normal.  Eye Sclera/Conjunctiva- Left- Normal. Right- Normal. Nose & Sinuses Nasal Mucosa- Left-  Boggy  and Congested. Right-  Boggy and  Congested.Bilateral  No maxillary and  No frontal sinus pressure. Mouth & Throat Lips: Upper Lip- Normal: no dryness, cracking, pallor, cyanosis, or vesicular eruption. Lower Lip-Normal: no dryness, cracking, pallor, cyanosis or vesicular eruption. Buccal Mucosa- Bilateral- No Aphthous ulcers. Oropharynx- No Discharge or Erythema. Tonsils: Characteristics- Bilateral- No Erythema or Congestion. Size/Enlargement- Bilateral- No enlargement. Discharge- bilateral-None.  Neck Neck- Supple. No Masses.   Chest and Lung Exam Auscultation: Breath Sounds:-Clear even and unlabored.  Cardiovascular Auscultation:Rythm- Regular, rate and rhythm. Murmurs & Other Heart Sounds:Ausculatation of the heart reveal- No Murmurs.  Lymphatic Head & Neck General Head & Neck Lymphatics: Bilateral: Description- No Localized lymphadenopathy.  Lower ext- see quality metrics     Assessment & Plan:  For diabetes continue metformin. Get back on better diet/low sugar. Exercise as well. If sugars are not coming down on follow up will need to increase metformin dose.  Reminder to stop smoking.  HDL can increase with exercise.   For allergies continue astelin. Rx xyzal.  Follow up in 3 months or as needed  Maysel Mccolm, Percell Miller, [employer]

## 2016-10-28 NOTE — Progress Notes (Signed)
Pre visit review using our clinic review tool, if applicable. No additional management support is needed unless otherwise documented below in the visit note. 

## 2017-02-03 ENCOUNTER — Telehealth: Payer: 59 | Admitting: Nurse Practitioner

## 2017-02-03 DIAGNOSIS — R05 Cough: Secondary | ICD-10-CM

## 2017-02-03 DIAGNOSIS — R059 Cough, unspecified: Secondary | ICD-10-CM

## 2017-02-03 MED ORDER — AZITHROMYCIN 250 MG PO TABS
ORAL_TABLET | ORAL | 0 refills | Status: DC
Start: 2017-02-03 — End: 2017-07-12

## 2017-02-03 NOTE — Progress Notes (Signed)
We are sorry that you are not feeling well.  Here is how we plan to help!  Based on your presentation I believe you most likely have A cough due to bacteria.  When patients have a fever and a productive cough with a change in color or increased sputum production, we are concerned about bacterial bronchitis.  If left untreated it can progress to pneumonia.  If your symptoms do not improve with your treatment plan it is important that you contact your provider.   I have prescribed Azithromyin 250 mg: two tables now and then one tablet daily for 4 additonal days    In addition you may use A non-prescription cough medication called Mucinex DM: take 2 tablets every 12 hours.    From your responses in the eVisit questionnaire you describe inflammation in the upper respiratory tract which is causing a significant cough.  This is commonly called Bronchitis and has four common causes:    Allergies  Viral Infections  Acid Reflux  Bacterial Infection Allergies, viruses and acid reflux are treated by controlling symptoms or eliminating the cause. An example might be a cough caused by taking certain blood pressure medications. You stop the cough by changing the medication. Another example might be a cough caused by acid reflux. Controlling the reflux helps control the cough.  USE OF BRONCHODILATOR ("RESCUE") INHALERS: There is a risk from using your bronchodilator too frequently.  The risk is that over-reliance on a medication which only relaxes the muscles surrounding the breathing tubes can reduce the effectiveness of medications prescribed to reduce swelling and congestion of the tubes themselves.  Although you feel brief relief from the bronchodilator inhaler, your asthma may actually be worsening with the tubes becoming more swollen and filled with mucus.  This can delay other crucial treatments, such as oral steroid medications. If you need to use a bronchodilator inhaler daily, several times per day,  you should discuss this with your provider.  There are probably better treatments that could be used to keep your asthma under control.     HOME CARE . Only take medications as instructed by your medical team. . Complete the entire course of an antibiotic. . Drink plenty of fluids and get plenty of rest. . Avoid close contacts especially the very young and the elderly . Cover your mouth if you cough or cough into your sleeve. . Always remember to wash your hands . A steam or ultrasonic humidifier can help congestion.   GET HELP RIGHT AWAY IF: . You develop worsening fever. . You become short of breath . You cough up blood. . Your symptoms persist after you have completed your treatment plan MAKE SURE YOU   Understand these instructions.  Will watch your condition.  Will get help right away if you are not doing well or get worse.  Your e-visit answers were reviewed by a board certified advanced clinical practitioner to complete your personal care plan.  Depending on the condition, your plan could have included both over the counter or prescription medications. If there is a problem please reply  once you have received a response from your provider. Your safety is important to us.  If you have drug allergies check your prescription carefully.    You can use MyChart to ask questions about today's visit, request a non-urgent call back, or ask for a work or school excuse for 24 hours related to this e-Visit. If it has been greater than 24 hours you will need   to follow up with your provider, or enter a new e-Visit to address those concerns. You will get an e-mail in the next two days asking about your experience.  I hope that your e-visit has been valuable and will speed your recovery. Thank you for using e-visits.   

## 2017-02-14 ENCOUNTER — Telehealth: Payer: Self-pay | Admitting: Medical

## 2017-02-14 ENCOUNTER — Other Ambulatory Visit (INDEPENDENT_AMBULATORY_CARE_PROVIDER_SITE_OTHER): Payer: 59

## 2017-02-14 DIAGNOSIS — E119 Type 2 diabetes mellitus without complications: Secondary | ICD-10-CM | POA: Diagnosis not present

## 2017-02-14 LAB — COMPREHENSIVE METABOLIC PANEL
ALT: 18 U/L (ref 0–53)
AST: 17 U/L (ref 0–37)
Albumin: 4.3 g/dL (ref 3.5–5.2)
Alkaline Phosphatase: 56 U/L (ref 39–117)
BUN: 20 mg/dL (ref 6–23)
CO2: 31 mEq/L (ref 19–32)
Calcium: 8.9 mg/dL (ref 8.4–10.5)
Chloride: 102 mEq/L (ref 96–112)
Creatinine, Ser: 1 mg/dL (ref 0.40–1.50)
GFR: 80.81 mL/min (ref >60.00)
Glucose, Bld: 86 mg/dL (ref 70–99)
Potassium: 4.1 mEq/L (ref 3.5–5.1)
Sodium: 139 mEq/L (ref 135–145)
Total Bilirubin: 0.8 mg/dL (ref 0.2–1.2)
Total Protein: 6.8 g/dL (ref 6.0–8.3)

## 2017-02-14 LAB — HEMOGLOBIN A1C: Hgb A1c MFr Bld: 6.3 % (ref 4.6–6.5)

## 2017-02-14 NOTE — Telephone Encounter (Signed)
Self   Refill metFORMIN   Pharmacy: [facility] [address] [place]

## 2017-02-14 NOTE — Telephone Encounter (Signed)
Pt also stated that he completed an e visit and was prescribed a medication, pt isnt sure of medication name. However pt says that medication didn't help. He would like to be advised further.

## 2017-02-15 MED ORDER — METFORMIN HCL 500 MG PO TABS: 500.0000 mg | ORAL_TABLET | Freq: Two times a day (BID) | ORAL | 0 refills | Status: DC

## 2017-02-15 NOTE — Telephone Encounter (Signed)
Pt had an e visit on 02/03/17  for cough.Pt was given azithromycin Pt states it did not help.Please Advise.

## 2017-02-15 NOTE — Telephone Encounter (Signed)
Rx sent to pharmacy. Pt is due for follow up please call and schedule appointment

## 2017-02-21 ENCOUNTER — Encounter: Payer: Self-pay | Admitting: Medical

## 2017-02-21 ENCOUNTER — Ambulatory Visit (INDEPENDENT_AMBULATORY_CARE_PROVIDER_SITE_OTHER): Payer: 59 | Admitting: Medical

## 2017-02-21 ENCOUNTER — Ambulatory Visit (HOSPITAL_BASED_OUTPATIENT_CLINIC_OR_DEPARTMENT_OTHER)
Admission: RE | Admit: 2017-02-21 | Discharge: 2017-02-21 | Disposition: A | Discharge status: Home / Self Care | Payer: 59 | Source: Ambulatory Visit | Attending: Medical | Admitting: Medical

## 2017-02-21 VITALS — BP 113/74 | HR 73 | Temp 98.4°F | Resp 16 | Ht 72.0 in | Wt 188.6 lb

## 2017-02-21 DIAGNOSIS — R05 Cough: Secondary | ICD-10-CM

## 2017-02-21 DIAGNOSIS — R059 Cough, unspecified: Secondary | ICD-10-CM

## 2017-02-21 DIAGNOSIS — E119 Type 2 diabetes mellitus without complications: Secondary | ICD-10-CM

## 2017-02-21 DIAGNOSIS — F17200 Nicotine dependence, unspecified, uncomplicated: Secondary | ICD-10-CM

## 2017-02-21 DIAGNOSIS — R918 Other nonspecific abnormal finding of lung field: Secondary | ICD-10-CM | POA: Insufficient documentation

## 2017-02-21 DIAGNOSIS — J849 Interstitial pulmonary disease, unspecified: Secondary | ICD-10-CM | POA: Diagnosis not present

## 2017-02-21 DIAGNOSIS — J3489 Other specified disorders of nose and nasal sinuses: Secondary | ICD-10-CM

## 2017-02-21 DIAGNOSIS — J40 Bronchitis, not specified as acute or chronic: Secondary | ICD-10-CM

## 2017-02-21 MED ORDER — DOXYCYCLINE HYCLATE 100 MG PO TABS: 100.0000 mg | ORAL_TABLET | Freq: Two times a day (BID) | ORAL | 0 refills | Status: DC

## 2017-02-21 MED ORDER — BUPROPION HCL ER (SR) 150 MG PO TB12: ORAL_TABLET | ORAL | 1 refills | Status: DC

## 2017-02-21 MED ORDER — BENZONATATE 100 MG PO CAPS
100.0000 mg | ORAL_CAPSULE | Freq: Three times a day (TID) | ORAL | 0 refills | Status: DC | PRN
Start: 2017-02-21 — End: 2017-07-12

## 2017-02-21 NOTE — Patient Instructions (Addendum)
Diabetes is well controlled today. Continue metformin today.  Continue to encourage stop smoking. Will get cxr. Re-write wellbutrin for smoking cessation.  For bronchitis rx doxycycline. For cough rx benzonatate. For nasal congestion use your nasal sprays.  For low hdl can try to get more exercise though this may be difficult.  Call insurance and ask about coverage for derm visit but also ask about colonoscopy as well as I have referred you in past. Can reactivate that referral.  Follow up in 3 months or as needed

## 2017-02-21 NOTE — Progress Notes (Signed)
Subjective:    Patient ID: Alec [name], male    DOB: 05-27-1956, 61 y.o.   MRN: [id number]  HPI   Pt in for follow up.  Pt recent labs cmp, a1c, and lipid panel. Pt a1c was 6.3. Pt kidney function, cholesterol, Kidney function were all blood. Regarding the lipid panel only mildly low HDL.  Patient is a nonsmoker but does want to quit smoking. He is willing to try Wellbutrin.  Pt also states he has some persisting sinus pressure following e visit. He was given azihtromycin. He is still coughing up some mucous. Some sneezing. He states he feels better but symptoms persist some fro evist end of July.    Review of Systems  Constitutional: Negative for chills, fatigue and fever.  HENT: Positive for congestion and postnasal drip. Negative for drooling, facial swelling, mouth sores, sinus pain and sinus pressure.   Respiratory: Positive for cough. Negative for chest tightness, shortness of breath and wheezing.   Cardiovascular: Negative for chest pain and palpitations.  Gastrointestinal: Negative for abdominal pain.  Genitourinary: Negative for decreased urine volume, difficulty urinating, enuresis, flank pain, frequency, genital sores, hematuria, penile pain and testicular pain.  Musculoskeletal: Negative for back pain, joint swelling, myalgias and neck stiffness.  Skin: Negative for rash.  Neurological: Negative for dizziness and headaches.  Hematological: Negative for adenopathy. Does not bruise/bleed easily.  Psychiatric/Behavioral: Negative for behavioral problems and confusion. The patient is not nervous/anxious.      Past Medical History:  Diagnosis Date  . ALLERGIC RHINITIS   . Allergy   . DIVERTICULITIS, HX OF   . GERD   . LEG PAIN, LEFT   . SINUSITIS, CHRONIC   . SMOKER      Social History   Social History  . Marital status: Married    Spouse name: N/A  . Number of children: N/A  . Years of education: N/A   Occupational History  . Not on file.   Social  History Main Topics  . Smoking status: Current Every Day Smoker    Packs/day: 1.50    Years: 40.00  . Smokeless tobacco: Never Used     Comment: Married, lives with wife (MM dx 2009)  . Alcohol use No  . Drug use: No  . Sexual activity: Yes   Other Topics Concern  . 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 SPINE SURGERY     lumbar surgery 2012.    Family History  Problem Relation Age of Onset  . Alzheimer's disease Mother   . Heart disease Father   . Parkinsonism Father   . Alcohol abuse Brother     No Known Allergies  Current Outpatient Prescriptions on File Prior to Visit  Medication Sig Dispense Refill  . acetaminophen (TYLENOL) 500 MG tablet Take 500 mg by mouth as needed.      Marland Kitchen azelastine (ASTELIN) 0.1 % nasal spray Place 2 sprays into both nostrils 2 (two) times daily. Use in each nostril as directed 30 mL 3  . azithromycin (ZITHROMAX Z-PAK) 250 MG tablet As directed 6 tablet 0  . buPROPion (WELLBUTRIN SR) 150 MG 12 hr tablet 1 tab po q day for 3 days then twice daily 63 tablet 1  . diclofenac (VOLTAREN) 75 MG EC tablet Take 1 tablet (75 mg total) by mouth 2 (two) times daily. 30 tablet 0  . fluticasone (FLONASE) 50  MCG/ACT nasal spray Place 2 sprays into both nostrils daily. 16 g 3  . glucose blood (ONETOUCH VERIO) test strip Use as instructed 100 each 12  . levocetirizine (XYZAL) 5 MG tablet Take 1 tablet (5 mg total) by mouth every evening. 30 tablet 1  . metFORMIN (GLUCOPHAGE) 500 MG tablet Take 1 tablet (500 mg total) by mouth 2 (two) times daily with a meal. 180 tablet 0  . Multiple Vitamin (MULTIVITAMIN) capsule Take 1 capsule by mouth daily.    Marland Kitchen omeprazole (PRILOSEC) 20 MG capsule Take 20 mg by mouth daily.      Glory Rosebush DELICA LANCETS 42J MISC Use as directed. 100 each 12   No current facility-administered medications on file prior to visit.     BP  113/74   Pulse 73   Temp 98.4 F (36.9 C) (Oral)   Resp 16   Ht 6' (1.829 m)   Wt 188 lb 9.6 oz (85.5 kg)   SpO2 100%   BMI 25.58 kg/m       Objective:   Physical Exam  General- No acute distress. Pleasant patient. Neck- Full range of motion, no jvd Lungs- Clear, even and unlabored. Heart- regular rate and rhythm. Neurologic- CNII- XII grossly intact.  Lower ext- See quality metrics.  HEENT Head- Normal. Ear Auditory Canal - Left- Normal. Right - Normal.Tympanic Membrane- Left- Normal. Right- Normal. Eye Sclera/Conjunctiva- Left- Normal. Right- Normal. Nose & Sinuses Nasal Mucosa- Left-  Boggy and Congested. Right-  Boggy and  Congested.Bilateral mild maxillary tenderness  and frontal  mild sinus pressure. Mouth & Throat Lips: Upper Lip- Normal: no dryness, cracking, pallor, cyanosis, or vesicular eruption. Lower Lip-Normal: no dryness, cracking, pallor, cyanosis or vesicular eruption. Buccal Mucosa- Bilateral- No Aphthous ulcers. Oropharynx- No Discharge or Erythema. Tonsils: Characteristics- Bilateral- No Erythema or Congestion. Size/Enlargement- Bilateral- No enlargement. Discharge- bilateral-None.  .         Assessment & Plan:  Diabetes is well controlled today. Continue metformin today.  Continue to encourage stop smoking. Will get cxr. Re-write wellbutrin for smoking cessation.  For bronchitis rx doxycycline. For cough rx benzonatate. For nasal congestion use your nasal sprays.  For low hdl can try to get more exercise though this may be difficult.  Call insurance and ask about coverage for derm visit but also ask about colonoscopy as well as I have referred you in past. Can reactivate that referral.  Follow up in 3 months or as needed

## 2017-02-22 ENCOUNTER — Telehealth: Payer: Self-pay | Admitting: Medical

## 2017-02-22 NOTE — Telephone Encounter (Signed)
Pt called in returning call for results. Pt says that it is okay to leave a detailed vm he is out of town at [employer] and miss the call.

## 2017-02-23 NOTE — Telephone Encounter (Signed)
LMOM with contact name and number [for return call, if needed] [id number] message of Xray results, chronic not acute issue, and to focus on smoking cessation per further provider instructions/SLS 08/015

## 2017-06-30 ENCOUNTER — Other Ambulatory Visit (INDEPENDENT_AMBULATORY_CARE_PROVIDER_SITE_OTHER): Payer: 59

## 2017-06-30 ENCOUNTER — Telehealth: Payer: Self-pay

## 2017-06-30 DIAGNOSIS — E119 Type 2 diabetes mellitus without complications: Secondary | ICD-10-CM | POA: Diagnosis not present

## 2017-06-30 LAB — COMPREHENSIVE METABOLIC PANEL
ALT: 19 U/L (ref 0–53)
AST: 19 U/L (ref 0–37)
Albumin: 4.9 g/dL (ref 3.5–5.2)
Alkaline Phosphatase: 61 U/L (ref 39–117)
BUN: 18 mg/dL (ref 6–23)
CO2: 32 mEq/L (ref 19–32)
Calcium: 9.6 mg/dL (ref 8.4–10.5)
Chloride: 100 mEq/L (ref 96–112)
Creatinine, Ser: 0.98 mg/dL (ref 0.40–1.50)
GFR: 82.62 mL/min (ref >60.00)
Glucose, Bld: 105 mg/dL — ABNORMAL HIGH (ref 70–99)
Potassium: 4.3 mEq/L (ref 3.5–5.1)
Sodium: 138 mEq/L (ref 135–145)
Total Bilirubin: 0.9 mg/dL (ref 0.2–1.2)
Total Protein: 7.7 g/dL (ref 6.0–8.3)

## 2017-06-30 LAB — HEMOGLOBIN A1C: Hgb A1c MFr Bld: 6.2 % (ref 4.6–6.5)

## 2017-06-30 NOTE — Telephone Encounter (Signed)
Pt wants to know if he can get a1c done today and schedule a follow up week after Christmas.Please advise.

## 2017-06-30 NOTE — Telephone Encounter (Signed)
Ok a1c and cmp today

## 2017-07-12 ENCOUNTER — Encounter: Payer: Self-pay | Admitting: Medical

## 2017-07-12 ENCOUNTER — Ambulatory Visit: Payer: 59 | Admitting: Medical

## 2017-07-12 VITALS — BP 112/67 | HR 69 | Temp 98.2°F | Resp 16 | Ht 74.0 in | Wt 192.6 lb

## 2017-07-12 DIAGNOSIS — K635 Polyp of colon: Secondary | ICD-10-CM | POA: Diagnosis not present

## 2017-07-12 DIAGNOSIS — R748 Abnormal levels of other serum enzymes: Secondary | ICD-10-CM

## 2017-07-12 DIAGNOSIS — E119 Type 2 diabetes mellitus without complications: Secondary | ICD-10-CM | POA: Diagnosis not present

## 2017-07-12 DIAGNOSIS — F17200 Nicotine dependence, unspecified, uncomplicated: Secondary | ICD-10-CM

## 2017-07-12 MED ORDER — METFORMIN HCL 500 MG PO TABS: 500.0000 mg | ORAL_TABLET | Freq: Two times a day (BID) | ORAL | 3 refills | Status: DC

## 2017-07-12 NOTE — Progress Notes (Signed)
Subjective:    Patient ID: Dow Adolph, male    DOB: 1955-09-13, 62 y.o.   MRN: [id number]  HPI  Pt in for follow up.  Pt sugars average was 6.2  And his metabolic panel looked good but sugar was mild elevated.  Admits not eating great around the holidays recently.  But does intend to better/less sugar  In the past lipid panel looked good except hdl mild low.   Pt is still smoking.  Pt has declined flu vaccine and declines pneumovaccine.  He states that he is aware that he needs to colonoscopy done.  The put in that referral today.  States Wellbutrin did not help him stop smoking.  He states that he needs to be motivated and then can quit.  Declines any other medications to help him stop presently.      Review of Systems  Constitutional: Negative for chills, fatigue and fever.  HENT: Negative for congestion, dental problem, ear discharge, facial swelling, mouth sores, postnasal drip, sinus pressure and sinus pain.   Respiratory: Negative for cough, chest tightness, shortness of breath and wheezing.   Cardiovascular: Negative for chest pain and palpitations.  Gastrointestinal: Negative for abdominal pain, blood in stool, constipation, diarrhea, rectal pain and vomiting.  Musculoskeletal: Negative for back pain and gait problem.  Skin: Negative for rash.  Neurological: Negative for dizziness, seizures, speech difficulty, weakness, light-headedness and headaches.  Hematological: Negative for adenopathy. Does not bruise/bleed easily.  Psychiatric/Behavioral: Negative for behavioral problems.    Past Medical History:  Diagnosis Date  . ALLERGIC RHINITIS   . Allergy   . DIVERTICULITIS, HX OF   . GERD   . LEG PAIN, LEFT   . SINUSITIS, CHRONIC   . SMOKER      Social History   Socioeconomic History  . Marital status: Married    Spouse name: Not on file  . Number of children: Not on file  . Years of education: Not on file  . Highest education level: Not on file    Social Needs  . Financial resource strain: Not on file  . Food insecurity - worry: Not on file  . Food insecurity - inability: Not on file  . Transportation needs - medical: Not on file  . Transportation needs - non-medical: Not on file  Occupational History  . Not on file  Tobacco Use  . Smoking status: Current Every Day Smoker    Packs/day: 1.50    Years: 40.00    Pack years: 60.00  . Smokeless tobacco: Never Used  . Tobacco comment: Married, lives with wife (MM dx 2009)  Substance and Sexual Activity  . Alcohol use: No  . Drug use: No  . Sexual activity: Yes  Other Topics Concern  . 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 SPINE SURGERY     lumbar surgery 2012.    Family History  Problem Relation Age of Onset  . Alzheimer's disease Mother   . Heart disease Father   . Parkinsonism Father   . Alcohol abuse Brother     No Known Allergies  Current Outpatient Medications on File Prior to Visit  Medication Sig Dispense Refill  . acetaminophen (TYLENOL) 500 MG tablet Take 500 mg by mouth as needed.      Marland Kitchen azelastine (ASTELIN) 0.1 % nasal spray Place 2 sprays into both nostrils 2 (two) times daily. Use in  each nostril as directed 30 mL 3  . diclofenac (VOLTAREN) 75 MG EC tablet Take 1 tablet (75 mg total) by mouth 2 (two) times daily. 30 tablet 0  . doxycycline (VIBRA-TABS) 100 MG tablet Take 1 tablet (100 mg total) by mouth 2 (two) times daily. 20 tablet 0  . fluticasone (FLONASE) 50 MCG/ACT nasal spray Place 2 sprays into both nostrils daily. 16 g 3  . glucose blood (ONETOUCH VERIO) test strip Use as instructed 100 each 12  . levocetirizine (XYZAL) 5 MG tablet Take 1 tablet (5 mg total) by mouth every evening. 30 tablet 1  . metFORMIN (GLUCOPHAGE) 500 MG tablet Take 1 tablet (500 mg total) by mouth 2 (two) times daily with a meal. 180 tablet 0  . Multiple Vitamin  (MULTIVITAMIN) capsule Take 1 capsule by mouth daily.    Marland Kitchen omeprazole (PRILOSEC) 20 MG capsule Take 20 mg by mouth daily.      Glory Rosebush DELICA LANCETS 66A MISC Use as directed. 100 each 12  . buPROPion (WELLBUTRIN SR) 150 MG 12 hr tablet 1 tab po q day for 3 days then twice daily (Patient not taking: Reported on 07/12/2017) 63 tablet 1   No current facility-administered medications on file prior to visit.     BP 112/67   Pulse 69   Temp 98.2 F (36.8 C) (Oral)   Resp 16   Ht 6\' 2"  (1.88 m)   Wt 192 lb 9.6 oz (87.4 kg)   SpO2 98%   BMI 24.73 kg/m       Objective:   Physical Exam  General Mental Status- Alert. General Appearance- Not in acute distress.   Skin General: Color- Normal Color. Moisture- Normal Moisture.  Neck Carotid Arteries- Normal color. Moisture- Normal Moisture. No carotid bruits. No JVD.  Chest and Lung Exam Auscultation: Breath Sounds:-Normal.  Cardiovascular Auscultation:Rythm- Regular. Murmurs & Other Heart Sounds:Auscultation of the heart reveals- No Murmurs.  Abdomen Inspection:-Inspeection Normal. Palpation/Percussion:Note:No mass. Palpation and Percussion of the abdomen reveal- Non Tender, Non Distended + BS, no rebound or guarding.   Neurologic Cranial Nerve exam:- CN III-XII intact(No nystagmus), symmetric smile. Strength:- 5/5 equal and symmetric strength both upper and lower extremities.  Feet-see quality metrics      Assessment & Plan:  For diabetes refilling your metformin. Low sugar diet and exercise some. A1c recently was acceptable.   For low hdl continue daily exercise/walking. Other markers are good.  Continue to try to quit smoking. CXR in 2018 done.  Please reconsider flu vaccine and pneumovaccine.  Call [facility] to schedule diabetic eye exam.  Placed GI referral to Dr. Ardis Hughs to repeat colonoscopy.  Future labs placed today. Schedule those 90 days from most recent labs.  Follow up in 3 months or as  needed.  Telesha Deguzman, Percell Miller, PA-C

## 2017-07-12 NOTE — Patient Instructions (Addendum)
For diabetes refilling your metformin. Low sugar diet and exercise some. A1c recently was acceptable.   For low hdl continue daily exercise/walking. Other markers are good.  Continue to try to quit smoking. CXR in 2018 done.  Please reconsider flu vaccine and pneumovaccine.  Call [facility] to schedule diabetic eye exam.  Placed GI referral to Dr. Ardis Hughs to repeat colonoscopy.  Future labs placed today. Schedule those 90 days from most recent labs.  Follow up in 3 months or as needed.

## 2017-07-26 ENCOUNTER — Encounter: Payer: Self-pay | Admitting: Gastroenterology

## 2017-09-18 ENCOUNTER — Encounter: Payer: 59 | Admitting: Gastroenterology

## 2017-10-06 ENCOUNTER — Ambulatory Visit (AMBULATORY_SURGERY_CENTER): Payer: Self-pay

## 2017-10-06 ENCOUNTER — Other Ambulatory Visit: Payer: Self-pay

## 2017-10-06 VITALS — Ht 70.0 in | Wt 188.0 lb

## 2017-10-06 DIAGNOSIS — Z86010 Personal history of colonic polyps: Secondary | ICD-10-CM

## 2017-10-06 MED ORDER — PEG 3350-KCL-NA BICARB-NACL 420 G PO SOLR
4000.0000 mL | Freq: Once | ORAL | 0 refills | Status: AC
Start: 2017-10-06 — End: 2017-10-06

## 2017-10-06 NOTE — Progress Notes (Signed)
Denies allergies to eggs or soy products. Denies complication of anesthesia or sedation. Denies use of weight loss medication. Denies use of O2.   Emmi instructions declined.  

## 2017-10-13 ENCOUNTER — Encounter: Payer: Self-pay | Admitting: Gastroenterology

## 2017-10-13 ENCOUNTER — Other Ambulatory Visit (INDEPENDENT_AMBULATORY_CARE_PROVIDER_SITE_OTHER): Payer: 59

## 2017-10-13 DIAGNOSIS — E119 Type 2 diabetes mellitus without complications: Secondary | ICD-10-CM | POA: Diagnosis not present

## 2017-10-13 DIAGNOSIS — R748 Abnormal levels of other serum enzymes: Secondary | ICD-10-CM | POA: Diagnosis not present

## 2017-10-13 LAB — COMPREHENSIVE METABOLIC PANEL
ALT: 18 U/L (ref 0–53)
AST: 17 U/L (ref 0–37)
Albumin: 4.6 g/dL (ref 3.5–5.2)
Alkaline Phosphatase: 60 U/L (ref 39–117)
BUN: 20 mg/dL (ref 6–23)
CO2: 31 mEq/L (ref 19–32)
Calcium: 9.5 mg/dL (ref 8.4–10.5)
Chloride: 101 mEq/L (ref 96–112)
Creatinine, Ser: 0.9 mg/dL (ref 0.40–1.50)
GFR: 91.06 mL/min (ref >60.00)
Glucose, Bld: 115 mg/dL — ABNORMAL HIGH (ref 70–99)
Potassium: 4.2 mEq/L (ref 3.5–5.1)
Sodium: 137 mEq/L (ref 135–145)
Total Bilirubin: 0.7 mg/dL (ref 0.2–1.2)
Total Protein: 7.2 g/dL (ref 6.0–8.3)

## 2017-10-13 LAB — LIPID PANEL
Cholesterol: 136 mg/dL (ref 0–200)
HDL: 35 mg/dL — ABNORMAL LOW (ref >39.00)
LDL Cholesterol: 77 mg/dL (ref 0–99)
NonHDL: 101.05
Total CHOL/HDL Ratio: 4
Triglycerides: 119 mg/dL (ref 0.0–149.0)
VLDL: 23.8 mg/dL (ref 0.0–40.0)

## 2017-10-13 LAB — HEMOGLOBIN A1C: Hgb A1c MFr Bld: 6.4 % (ref 4.6–6.5)

## 2017-10-14 LAB — MICROALBUMIN, URINE: Microalb, Ur: 0.4 mg/dL

## 2017-10-18 ENCOUNTER — Telehealth: Payer: Self-pay

## 2017-10-18 NOTE — Telephone Encounter (Signed)
Copied from [place]. Topic: General - Other >> Oct 17, 2017  3:29 PM Oneta Rack wrote: Relation to pt: self Call back [phone]   Reason for call:  Patient states he cancelled his colonoscopy appointment due to out of pocket cost stating he would like to know if orders were placed as preventative. Patient states insurance is claiming orders were placed as surgical / procedure colonoscopy due to previous polyps found. Patient seeking inquiring if theres anything PCP can do, please advise  >> Oct 17, 2017  3:51 PM Oneta Rack wrote: Relation to pt: self Call back [phone]   Reason for call:  Patient states he cancelled his colonoscopy appointment due to out of pocket cost stating he would like to know if orders were placed as preventative. Patient states insurance is claiming orders were placed as surgical / procedure colonoscopy due to previous polyps found. Patient seeking inquiring if theres anything PCP can do, please advise

## 2017-10-18 NOTE — Telephone Encounter (Signed)
Unfortunately I think that since it is known he has a polyp insurance will not consider the procedure screening.    I would recommend that he ask the gastroenterologist office.  They have the record of the prior polyp and even if asked for them to do it as a screening they would likely be obligated to mention prior polyp.  Another option is would you mind calling the GI office and speaking with Colletta Maryland.  She used to work in our office and is very helpful.  She might give [place] some information for clarification purposes.

## 2017-10-20 ENCOUNTER — Ambulatory Visit: Payer: 59 | Admitting: Medical

## 2017-10-20 ENCOUNTER — Encounter: Payer: 59 | Admitting: Gastroenterology

## 2017-10-23 ENCOUNTER — Ambulatory Visit: Payer: 59 | Admitting: Medical

## 2017-10-23 ENCOUNTER — Encounter: Payer: Self-pay | Admitting: Medical

## 2017-10-23 VITALS — BP 117/70 | HR 73 | Temp 98.1°F | Resp 16 | Ht 72.0 in | Wt 198.2 lb

## 2017-10-23 DIAGNOSIS — E786 Lipoprotein deficiency: Secondary | ICD-10-CM

## 2017-10-23 DIAGNOSIS — E118 Type 2 diabetes mellitus with unspecified complications: Secondary | ICD-10-CM

## 2017-10-23 DIAGNOSIS — Z86010 Personal history of colonic polyps: Secondary | ICD-10-CM

## 2017-10-23 DIAGNOSIS — F17200 Nicotine dependence, unspecified, uncomplicated: Secondary | ICD-10-CM | POA: Diagnosis not present

## 2017-10-23 NOTE — Progress Notes (Signed)
Subjective:    Patient ID: Alec [name], male    DOB: 14-Jun-1956, 62 y.o.   MRN: [id number]  HPI  Pt in for follow up.  Pt states he he was denied screening colonoscopy due to hx of polyp. He does not want to pay for the colonoscopy.   Pt a1-c is little higher but still less thes a1c 6.5. Pt seems to think cinammon helped but then he stopped. He is still watching/eating  low sugar diet.    Pt lipid panel is well controlled except hdl is low. Pt still walking a lot at work.   Pt bp well controlled.   Review of Systems  Constitutional: Negative for chills, fatigue and fever.  Respiratory: Negative for cough, chest tightness, shortness of breath and wheezing.   Cardiovascular: Negative for chest pain and palpitations.  Gastrointestinal: Negative for abdominal distention, abdominal pain, constipation, diarrhea and vomiting.  Musculoskeletal: Negative for back pain and gait problem.  Skin: Negative for rash.  Neurological: Negative for dizziness and headaches.  Hematological: Negative for adenopathy. Does not bruise/bleed easily.  Psychiatric/Behavioral: Negative for agitation, confusion, dysphoric mood and hallucinations.    Past Medical History:  Diagnosis Date  . ALLERGIC RHINITIS   . Allergy   . Arthritis   . Diabetes mellitus without complication ([place])   . DIVERTICULITIS, HX OF   . GERD   . LEG PAIN, LEFT   . SINUSITIS, CHRONIC   . SMOKER      Social History   Socioeconomic History  . Marital status: Married    Spouse name: Not on file  . Number of children: Not on file  . Years of education: Not on file  . Highest education level: Not on file  Occupational History  . Not on file  Social Needs  . Financial resource strain: Not on file  . Food insecurity:    Worry: Not on file    Inability: Not on file  . Transportation needs:    Medical: Not on file    Non-medical: Not on file  Tobacco Use  . Smoking status: Current Every Day Smoker   Packs/day: 1.50    Years: 40.00    Pack years: 60.00  . Smokeless tobacco: Never Used  . Tobacco comment: Married, lives with wife (MM dx 2009)  Substance and Sexual Activity  . Alcohol use: No  . Drug use: No  . Sexual activity: Yes  Lifestyle  . Physical activity:    Days per week: Not on file    Minutes per session: Not on file  . Stress: Not on file  Relationships  . Social connections:    Talks on phone: Not on file    Gets together: Not on file    Attends religious service: Not on file    Active member of club or organization: Not on file    Attends meetings of clubs or organizations: Not on file    Relationship status: Not on file  . Intimate partner violence:    Fear of current or ex partner: Not on file    Emotionally abused: Not on file    Physically abused: Not on file    Forced sexual activity: Not on file  Other Topics Concern  . 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 SPINE SURGERY     lumbar surgery 2012.    Family History  Problem Relation  Age of Onset  . Alzheimer's disease Mother   . Heart disease Father   . Parkinsonism Father   . Alcohol abuse Brother   . Colon cancer Neg Hx   . Esophageal cancer Neg Hx   . Liver cancer Neg Hx   . Pancreatic cancer Neg Hx   . Rectal cancer Neg Hx   . Stomach cancer Neg Hx     No Known Allergies  Current Outpatient Medications on File Prior to Visit  Medication Sig Dispense Refill  . acetaminophen (TYLENOL) 500 MG tablet Take 500 mg by mouth as needed.      Marland Kitchen glucose blood (ONETOUCH VERIO) test strip Use as instructed 100 each 12  . levocetirizine (XYZAL) 5 MG tablet Take 1 tablet (5 mg total) by mouth every evening. 30 tablet 1  . metFORMIN (GLUCOPHAGE) 500 MG tablet Take 1 tablet (500 mg total) by mouth 2 (two) times daily with a meal. 180 tablet 3  . Multiple Vitamin (MULTIVITAMIN) capsule Take 1  capsule by mouth daily.    Marland Kitchen omeprazole (PRILOSEC) 20 MG capsule Take 20 mg by mouth daily.      Glory Rosebush DELICA LANCETS 46E MISC Use as directed. 100 each 12  . OVER THE COUNTER MEDICATION Vitamin C, one tablet daily.    Marland Kitchen OVER THE COUNTER MEDICATION Vitamin D, One tablet daily.     No current facility-administered medications on file prior to visit.     BP 117/70   Pulse 73   Temp 98.1 F (36.7 C) (Oral)   Resp 16   Ht 6' (1.829 m)   Wt 198 lb 3.2 oz (89.9 kg)   SpO2 99%   BMI 26.88 kg/m       Objective:   Physical Exam  General Mental Status- Alert. General Appearance- Not in acute distress.   Skin General: Color- Normal Color. Moisture- Normal Moisture.  Neck Carotid Arteries- Normal color. Moisture- Normal Moisture. No carotid bruits. No JVD.  Chest and Lung Exam Auscultation: Breath Sounds:-Normal.  Cardiovascular Auscultation:Rythm- Regular. Murmurs & Other Heart Sounds:Auscultation of the heart reveals- No Murmurs.  Abdomen Inspection:-Inspeection Normal. Palpation/Percussion:Note:No mass. Palpation and Percussion of the abdomen reveal- Non Tender, Non Distended + BS, no rebound or guarding.   Neurologic Cranial Nerve exam:- CN III-XII intact(No nystagmus), symmetric smile. Strength:- 5/5 equal and symmetric strength both upper and lower extremities.  Lower ext- see quality metrics.    Assessment & Plan:  Your sugars levels are mild elevated recently but still under control.  Continue metformin. Can add back you cinnamon since you report this helped.  Please try to stop smoking. If you want help let us know.  With you history of polyp recommend you check on supplament coverage or see if you had double coverage how much colonoscopy would cost.   Follow up in 3 months or as needed.  Mackie Pai, PA-C

## 2017-10-23 NOTE — Patient Instructions (Addendum)
Your sugars levels are mild elevated recently but still under control.  Continue metformin. Can add back you cinnamon since you report this helped.  Please try to stop smoking. If you want help let us know.  With you history of polyp recommend you check on supplament coverage or see if you had double coverage how much colonoscopy would cost.   Follow up in 3 months or as needed.

## 2017-12-10 ENCOUNTER — Encounter: Payer: Self-pay | Admitting: Medical

## 2017-12-11 MED ORDER — METFORMIN HCL 500 MG PO TABS: 500.0000 mg | ORAL_TABLET | Freq: Two times a day (BID) | ORAL | 1 refills | Status: DC

## 2018-01-22 ENCOUNTER — Other Ambulatory Visit (INDEPENDENT_AMBULATORY_CARE_PROVIDER_SITE_OTHER): Payer: 59

## 2018-01-22 DIAGNOSIS — E118 Type 2 diabetes mellitus with unspecified complications: Secondary | ICD-10-CM

## 2018-01-22 DIAGNOSIS — E786 Lipoprotein deficiency: Secondary | ICD-10-CM

## 2018-01-22 LAB — COMPREHENSIVE METABOLIC PANEL
ALT: 16 U/L (ref 0–53)
AST: 15 U/L (ref 0–37)
Albumin: 4.6 g/dL (ref 3.5–5.2)
Alkaline Phosphatase: 59 U/L (ref 39–117)
BUN: 19 mg/dL (ref 6–23)
CO2: 30 mEq/L (ref 19–32)
Calcium: 9.4 mg/dL (ref 8.4–10.5)
Chloride: 102 mEq/L (ref 96–112)
Creatinine, Ser: 1.02 mg/dL (ref 0.40–1.50)
GFR: 78.74 mL/min (ref >60.00)
Glucose, Bld: 131 mg/dL — ABNORMAL HIGH (ref 70–99)
Potassium: 4.2 mEq/L (ref 3.5–5.1)
Sodium: 140 mEq/L (ref 135–145)
Total Bilirubin: 0.8 mg/dL (ref 0.2–1.2)
Total Protein: 6.6 g/dL (ref 6.0–8.3)

## 2018-01-22 LAB — LIPID PANEL
Cholesterol: 127 mg/dL (ref 0–200)
HDL: 32.4 mg/dL — ABNORMAL LOW (ref >39.00)
LDL Cholesterol: 67 mg/dL (ref 0–99)
NonHDL: 94.61
Total CHOL/HDL Ratio: 4
Triglycerides: 138 mg/dL (ref 0.0–149.0)
VLDL: 27.6 mg/dL (ref 0.0–40.0)

## 2018-01-22 LAB — HEMOGLOBIN A1C: Hgb A1c MFr Bld: 6.1 % (ref 4.6–6.5)

## 2018-01-29 ENCOUNTER — Ambulatory Visit: Payer: 59 | Admitting: Medical

## 2018-01-29 ENCOUNTER — Encounter: Payer: Self-pay | Admitting: Medical

## 2018-01-29 VITALS — BP 119/70 | HR 64 | Temp 97.9°F | Resp 16 | Ht 72.0 in | Wt 186.8 lb

## 2018-01-29 DIAGNOSIS — Z86010 Personal history of colonic polyps: Secondary | ICD-10-CM

## 2018-01-29 DIAGNOSIS — E118 Type 2 diabetes mellitus with unspecified complications: Secondary | ICD-10-CM | POA: Diagnosis not present

## 2018-01-29 DIAGNOSIS — F17200 Nicotine dependence, unspecified, uncomplicated: Secondary | ICD-10-CM | POA: Diagnosis not present

## 2018-01-29 DIAGNOSIS — R972 Elevated prostate specific antigen [PSA]: Secondary | ICD-10-CM

## 2018-01-29 LAB — PSA: PSA: 4.02 ng/mL — ABNORMAL HIGH (ref 0.10–4.00)

## 2018-01-29 MED ORDER — NICOTINE 21 MG/24HR TD PT24: 21.0000 mg | MEDICATED_PATCH | Freq: Every day | TRANSDERMAL | 0 refills | Status: DC

## 2018-01-29 NOTE — Patient Instructions (Addendum)
For diabetes well controlled. Continue current meds and low cholesterol diet.   Please try to quit smoking. Can use patches in place of smoking.  For low hdl. Increase exercise can increase hdl. But practical limits to exercise.  Will get repeat psa today.   Please schedule colonoscopy your diabetic eye exam.  Follow up 3 months or as needed

## 2018-01-29 NOTE — Progress Notes (Signed)
Subjective:    Patient ID: Alec [name], male    DOB: 08/15/1955, 62 y.o.   MRN: [id number]  HPI  Pt in for follow up.  Pt is thinking of doing patches to quit smoking. He is still smoking and knows he should stop. Cxr does  show chronic Insterstitial lung disease. Pt reports no sob, or wheezing. He states good exercise tolerance. Walks easily 10,000 steps a day.   Pt cholesterol is good except low hdl.  His a1-c did come back down to 6.1.  He is on metformin. No side effects.    Pt states will call Dr Ardis Hughs office to schedule his colonscopy. Also he states got call to schedule diabetic eye exam.   Review of Systems  Constitutional: Negative for chills, fatigue and fever.  Respiratory: Negative for cough, chest tightness, shortness of breath, wheezing and stridor.   Cardiovascular: Negative for chest pain and palpitations.  Genitourinary: Negative for decreased urine volume, dysuria, flank pain, frequency, penile pain and testicular pain.       Negative history for prostate cancer in his family.  Musculoskeletal: Negative for back pain.  Skin: Negative for rash.  Neurological: Negative for dizziness, seizures, syncope, weakness and light-headedness.  Hematological: Negative for adenopathy. Does not bruise/bleed easily.  Psychiatric/Behavioral: Negative for behavioral problems and confusion.    Past Medical History:  Diagnosis Date  . ALLERGIC RHINITIS   . Allergy   . Arthritis   . Diabetes mellitus without complication ([place])   . DIVERTICULITIS, HX OF   . GERD   . LEG PAIN, LEFT   . SINUSITIS, CHRONIC   . SMOKER      Social History   Socioeconomic History  . Marital status: Married    Spouse name: Not on file  . Number of children: Not on file  . Years of education: Not on file  . Highest education level: Not on file  Occupational History  . Not on file  Social Needs  . Financial resource strain: Not on file  . Food insecurity:    Worry: Not on file     Inability: Not on file  . Transportation needs:    Medical: Not on file    Non-medical: Not on file  Tobacco Use  . Smoking status: Current Every Day Smoker    Packs/day: 1.50    Years: 40.00    Pack years: 60.00  . Smokeless tobacco: Never Used  . Tobacco comment: Married, lives with wife (MM dx 2009)  Substance and Sexual Activity  . Alcohol use: No  . Drug use: No  . Sexual activity: Yes  Lifestyle  . Physical activity:    Days per week: Not on file    Minutes per session: Not on file  . Stress: Not on file  Relationships  . Social connections:    Talks on phone: Not on file    Gets together: Not on file    Attends religious service: Not on file    Active member of club or organization: Not on file    Attends meetings of clubs or organizations: Not on file    Relationship status: Not on file  . Intimate partner violence:    Fear of current or ex partner: Not on file    Emotionally abused: Not on file    Physically abused: Not on file    Forced sexual activity: Not on file  Other Topics Concern  . Not on file  Social History Narrative   Project  manager - construction/contract work    Past Surgical History:  Procedure Laterality Date  . SPINE SURGERY     lumbar surgery 2012.    Family History  Problem Relation Age of Onset  . Alzheimer's disease Mother   . Heart disease Father   . Parkinsonism Father   . Alcohol abuse Brother   . Colon cancer Neg Hx   . Esophageal cancer Neg Hx   . Liver cancer Neg Hx   . Pancreatic cancer Neg Hx   . Rectal cancer Neg Hx   . Stomach cancer Neg Hx     No Known Allergies  Current Outpatient Medications on File Prior to Visit  Medication Sig Dispense Refill  . acetaminophen (TYLENOL) 500 MG tablet Take 500 mg by mouth as needed.      Marland Kitchen glucose blood (ONETOUCH VERIO) test strip Use as instructed 100 each 12  . levocetirizine (XYZAL) 5 MG tablet Take 1 tablet (5 mg total) by mouth every evening. 30 tablet 1  .  metFORMIN (GLUCOPHAGE) 500 MG tablet Take 1 tablet (500 mg total) by mouth 2 (two) times daily with a meal. 180 tablet 1  . Multiple Vitamin (MULTIVITAMIN) capsule Take 1 capsule by mouth daily.    Marland Kitchen omeprazole (PRILOSEC) 20 MG capsule Take 20 mg by mouth daily.      Glory Rosebush DELICA LANCETS 88E MISC Use as directed. 100 each 12  . OVER THE COUNTER MEDICATION Vitamin C, one tablet daily.    Marland Kitchen OVER THE COUNTER MEDICATION Vitamin D, One tablet daily.     No current facility-administered medications on file prior to visit.     BP 119/70   Pulse 64   Temp 97.9 F (36.6 C) (Oral)   Resp 16   Ht 6' (1.829 m)   Wt 186 lb 12.8 oz (84.7 kg)   SpO2 99%   BMI 25.33 kg/m       Objective:   Physical Exam  General Mental Status- Alert. General Appearance- Not in acute distress.   Skin General: Color- Normal Color. Moisture- Normal Moisture.  Neck Carotid Arteries- Normal color. Moisture- Normal Moisture. No carotid bruits. No JVD.  Chest and Lung Exam Auscultation: Breath Sounds:-Normal.  Cardiovascular Auscultation:Rythm- Regular. Murmurs & Other Heart Sounds:Auscultation of the heart reveals- No Murmurs.  Abdomen Inspection:-Inspeection Normal. Palpation/Percussion:Note:No mass. Palpation and Percussion of the abdomen reveal- Non Tender, Non Distended + BS, no rebound or guarding.    Neurologic Cranial Nerve exam:- CN III-XII intact(No nystagmus), symmetric smile. Strength:- 5/5 equal and symmetric strength both upper and lower extremities.      Assessment & Plan:  For diabetes well controlled. Continue current meds and low cholesterol diet.   Please try to quit smoking. Can use patches in place of smoking.  For low hdl. Increase exercise can increase hdl. But practical limits to exercise.  Will get repeat psa today.   Please schedule colonoscopy your diabetic eye exam.  Follow up 3 months or as needed

## 2018-01-30 ENCOUNTER — Telehealth: Payer: Self-pay | Admitting: Medical

## 2018-01-30 DIAGNOSIS — R972 Elevated prostate specific antigen [PSA]: Secondary | ICD-10-CM

## 2018-01-30 NOTE — Telephone Encounter (Signed)
Referral to urologist placed. 

## 2018-03-23 ENCOUNTER — Ambulatory Visit: Payer: 59 | Admitting: Urology

## 2018-03-23 DIAGNOSIS — R972 Elevated prostate specific antigen [PSA]: Secondary | ICD-10-CM | POA: Diagnosis not present

## 2018-03-23 DIAGNOSIS — R3915 Urgency of urination: Secondary | ICD-10-CM | POA: Diagnosis not present

## 2018-03-23 DIAGNOSIS — N401 Enlarged prostate with lower urinary tract symptoms: Secondary | ICD-10-CM | POA: Diagnosis not present

## 2018-03-28 ENCOUNTER — Other Ambulatory Visit: Payer: Self-pay | Admitting: Urology

## 2018-03-28 DIAGNOSIS — R972 Elevated prostate specific antigen [PSA]: Secondary | ICD-10-CM

## 2018-04-06 ENCOUNTER — Encounter (HOSPITAL_COMMUNITY): Payer: Self-pay

## 2018-04-06 ENCOUNTER — Ambulatory Visit (HOSPITAL_COMMUNITY)
Admission: RE | Admit: 2018-04-06 | Discharge: 2018-04-06 | Disposition: A | Discharge status: Home / Self Care | Payer: 59 | Source: Ambulatory Visit | Attending: Urology | Admitting: Urology

## 2018-04-06 DIAGNOSIS — C61 Malignant neoplasm of prostate: Secondary | ICD-10-CM | POA: Diagnosis not present

## 2018-04-06 DIAGNOSIS — R972 Elevated prostate specific antigen [PSA]: Secondary | ICD-10-CM | POA: Diagnosis present

## 2018-04-06 MED ORDER — CEFTRIAXONE SODIUM 1 G IJ SOLR
1.0000 g | INTRAMUSCULAR | Status: DC
  Administered 2018-04-06: 1 g via INTRAMUSCULAR

## 2018-04-06 MED ORDER — LIDOCAINE HCL (PF) 2 % IJ SOLN
INTRAMUSCULAR | Status: AC
  Administered 2018-04-06: 10 mL
  Filled 2018-04-06: qty 10

## 2018-04-06 NOTE — Sedation Documentation (Signed)
Pt tolerated procedure well.  D/c instructions given and pt verbalized understanding.  Discharged ambulatory without difficulty.

## 2018-04-06 NOTE — Discharge Instructions (Signed)
Transrectal Ultrasound-Guided Biopsy A transrectal ultrasound-guided biopsy is a procedure to take samples of tissue from your prostate. Ultrasound images are used to guide the procedure. It is usually done to check the prostate gland for cancer. What happens before the procedure?  Do not eat or drink after midnight on the night before your procedure.  Take medicines as your doctor tells you.  Your doctor may have you stop taking some medicines 5-7 days before the procedure.  You will be given an enema before your procedure. During an enema, a liquid is put into your butt (rectum) to clear out waste.  You may have lab tests the day of your procedure.  Make plans to have someone drive you home. What happens during the procedure?  You will be given medicine to help you relax before the procedure. An IV tube will be put into one of your veins. It will be used to give fluids and medicine.  You will be given medicine to reduce the risk of infection (antibiotic).  You will be placed on your side.  A probe with gel will be put in your butt. This is used to take pictures of your prostate and the area around it.  A medicine to numb the area is put into your prostate.  A biopsy needle is then inserted and guided to your prostate.  Samples of prostate tissue are taken. The needle is removed.  The samples are sent to a lab to be checked. Results are usually back in 2-3 days. What happens after the procedure?  You will be taken to a room where you will be watched until you are doing okay.  You may have some pain in the area around your butt. You will be given medicines for this.  You may be able to go home the same day. Sometimes, an overnight stay in the hospital is needed. This information is not intended to replace advice given to you by your health care provider. Make sure you discuss any questions you have with your health care provider. Document Released: 06/15/2009 Document Revised:  12/03/2015 Document Reviewed: 02/13/2013 Elsevier Interactive Patient Education  2018 Elsevier Inc.  

## 2018-05-11 ENCOUNTER — Encounter: Payer: Self-pay | Admitting: Medical

## 2018-05-11 ENCOUNTER — Telehealth: Payer: Self-pay | Admitting: Medical

## 2018-05-11 DIAGNOSIS — E118 Type 2 diabetes mellitus with unspecified complications: Secondary | ICD-10-CM

## 2018-05-11 DIAGNOSIS — E786 Lipoprotein deficiency: Secondary | ICD-10-CM

## 2018-05-11 NOTE — Telephone Encounter (Signed)
Pt walked in and wants to know if he is do for labs and an appt the next week with pcp? Please advise.

## 2018-05-11 NOTE — Telephone Encounter (Signed)
I put in future labs today. He can get those done before appointment. But remind pt he needs to be fasting 8 hours before labs since checking his cholesterol.

## 2018-05-11 NOTE — Telephone Encounter (Signed)
Future labs placed before appointment

## 2018-05-11 NOTE — Telephone Encounter (Signed)
Pt was already scheduled for 05/15/18 @ 7:30am by Vilma Prader

## 2018-05-15 ENCOUNTER — Other Ambulatory Visit (INDEPENDENT_AMBULATORY_CARE_PROVIDER_SITE_OTHER): Payer: 59

## 2018-05-15 DIAGNOSIS — E786 Lipoprotein deficiency: Secondary | ICD-10-CM | POA: Diagnosis not present

## 2018-05-15 DIAGNOSIS — E118 Type 2 diabetes mellitus with unspecified complications: Secondary | ICD-10-CM

## 2018-05-15 LAB — COMPREHENSIVE METABOLIC PANEL WITH GFR
ALT: 19 U/L (ref 0–53)
AST: 17 U/L (ref 0–37)
Albumin: 4.9 g/dL (ref 3.5–5.2)
Alkaline Phosphatase: 60 U/L (ref 39–117)
BUN: 15 mg/dL (ref 6–23)
CO2: 32 meq/L (ref 19–32)
Calcium: 9.6 mg/dL (ref 8.4–10.5)
Chloride: 99 meq/L (ref 96–112)
Creatinine, Ser: 0.9 mg/dL (ref 0.40–1.50)
GFR: 90.88 mL/min
Glucose, Bld: 131 mg/dL — ABNORMAL HIGH (ref 70–99)
Potassium: 4.2 meq/L (ref 3.5–5.1)
Sodium: 137 meq/L (ref 135–145)
Total Bilirubin: 1 mg/dL (ref 0.2–1.2)
Total Protein: 7.1 g/dL (ref 6.0–8.3)

## 2018-05-15 LAB — LIPID PANEL
Cholesterol: 165 mg/dL (ref 0–200)
HDL: 40.8 mg/dL (ref >39.00)
LDL Cholesterol: 100 mg/dL — ABNORMAL HIGH (ref 0–99)
NonHDL: 124.22
Total CHOL/HDL Ratio: 4
Triglycerides: 119 mg/dL (ref 0.0–149.0)
VLDL: 23.8 mg/dL (ref 0.0–40.0)

## 2018-05-15 LAB — HEMOGLOBIN A1C: Hgb A1c MFr Bld: 6.1 % (ref 4.6–6.5)

## 2018-05-23 ENCOUNTER — Encounter: Payer: Self-pay | Admitting: Medical

## 2018-05-23 ENCOUNTER — Ambulatory Visit: Payer: 59 | Admitting: Medical

## 2018-05-23 VITALS — BP 112/61 | HR 62 | Temp 97.9°F | Resp 16 | Ht 72.0 in | Wt 189.6 lb

## 2018-05-23 DIAGNOSIS — Z86010 Personal history of colonic polyps: Secondary | ICD-10-CM

## 2018-05-23 DIAGNOSIS — F17200 Nicotine dependence, unspecified, uncomplicated: Secondary | ICD-10-CM | POA: Diagnosis not present

## 2018-05-23 DIAGNOSIS — E118 Type 2 diabetes mellitus with unspecified complications: Secondary | ICD-10-CM | POA: Diagnosis not present

## 2018-05-23 DIAGNOSIS — E786 Lipoprotein deficiency: Secondary | ICD-10-CM

## 2018-05-23 DIAGNOSIS — C61 Malignant neoplasm of prostate: Secondary | ICD-10-CM

## 2018-05-23 MED ORDER — METFORMIN HCL 500 MG PO TABS: 500.0000 mg | ORAL_TABLET | Freq: Two times a day (BID) | ORAL | 11 refills | Status: DC

## 2018-05-23 NOTE — Progress Notes (Signed)
Subjective:    Patient ID: Alec [name], male    DOB: 1956/03/01, 62 y.o.   MRN: [id number]  HPI   Pt in for follow up.  Pt had + prostate bx. He has decided on doing seeds. He has met with urologist and he has decided on Radioactive seed type treatment after considering other options.   Pt is scheduling to get colonoscopy June 04, 2018.  Pt is still smoking. I have advised him to stop in the past. I offered him nicotine patches. He has not been using.  His lipid panel shows mild low hdl.   His a1c is still low/less than 6.5. He is on metformin tablets.     Review of Systems  Constitutional: Negative for chills and fatigue.  Respiratory: Negative for cough, chest tightness, shortness of breath and wheezing.   Cardiovascular: Negative for chest pain and palpitations.  Gastrointestinal: Negative for abdominal pain, constipation, nausea and vomiting.  Genitourinary: Negative for dysuria, flank pain, frequency, hematuria, penile pain, testicular pain and urgency.  Musculoskeletal: Negative for back pain, joint swelling and neck stiffness.  Skin: Negative for rash.  Neurological: Negative for dizziness, syncope, numbness and headaches.  Hematological: Negative for adenopathy. Does not bruise/bleed easily.  Psychiatric/Behavioral: Negative for confusion and dysphoric mood.    Past Medical History:  Diagnosis Date  . ALLERGIC RHINITIS   . Allergy   . Arthritis   . Diabetes mellitus without complication ([place])   . DIVERTICULITIS, HX OF   . GERD   . LEG PAIN, LEFT   . SINUSITIS, CHRONIC   . SMOKER      Social History   Socioeconomic History  . Marital status: Married    Spouse name: Not on file  . Number of children: Not on file  . Years of education: Not on file  . Highest education level: Not on file  Occupational History  . Not on file  Social Needs  . Financial resource strain: Not on file  . Food insecurity:    Worry: Not on file    Inability: Not  on file  . Transportation needs:    Medical: Not on file    Non-medical: Not on file  Tobacco Use  . Smoking status: Current Every Day Smoker    Packs/day: 1.50    Years: 40.00    Pack years: 60.00  . Smokeless tobacco: Never Used  . Tobacco comment: Married, lives with wife (MM dx 2009)  Substance and Sexual Activity  . Alcohol use: No  . Drug use: No  . Sexual activity: Yes  Lifestyle  . Physical activity:    Days per week: Not on file    Minutes per session: Not on file  . Stress: Not on file  Relationships  . Social connections:    Talks on phone: Not on file    Gets together: Not on file    Attends religious service: Not on file    Active member of club or organization: Not on file    Attends meetings of clubs or organizations: Not on file    Relationship status: Not on file  . Intimate partner violence:    Fear of current or ex partner: Not on file    Emotionally abused: Not on file    Physically abused: Not on file    Forced sexual activity: Not on file  Other Topics Concern  . 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 SPINE SURGERY     lumbar surgery 2012.    Family History  Problem Relation Age of Onset  . Alzheimer's disease Mother   . Heart disease Father   . Parkinsonism Father   . Alcohol abuse Brother   . Colon cancer Neg Hx   . Esophageal cancer Neg Hx   . Liver cancer Neg Hx   . Pancreatic cancer Neg Hx   . Rectal cancer Neg Hx   . Stomach cancer Neg Hx     No Known Allergies  Current Outpatient Medications on File Prior to Visit  Medication Sig Dispense Refill  . acetaminophen (TYLENOL) 500 MG tablet Take 500 mg by mouth as needed.      Marland Kitchen glucose blood (ONETOUCH VERIO) test strip Use as instructed 100 each 12  . levocetirizine (XYZAL) 5 MG tablet Take 1 tablet (5 mg total) by mouth every evening. 30 tablet 1  . metFORMIN (GLUCOPHAGE) 500  MG tablet Take 1 tablet (500 mg total) by mouth 2 (two) times daily with a meal. 180 tablet 1  . Multiple Vitamin (MULTIVITAMIN) capsule Take 1 capsule by mouth daily.    . nicotine (NICODERM CQ - DOSED IN MG/24 HOURS) 21 mg/24hr patch Place 1 patch (21 mg total) onto the skin daily. 28 patch 0  . omeprazole (PRILOSEC) 20 MG capsule Take 20 mg by mouth daily.      Glory Rosebush DELICA LANCETS 73U MISC Use as directed. 100 each 12  . OVER THE COUNTER MEDICATION Vitamin C, one tablet daily.    Marland Kitchen OVER THE COUNTER MEDICATION Vitamin D, One tablet daily.     No current facility-administered medications on file prior to visit.     BP 112/61   Pulse 62   Temp 97.9 F (36.6 C) (Oral)   Resp 16   Ht 6' (1.829 m)   Wt 189 lb 9.6 oz (86 kg)   SpO2 100%   BMI 25.71 kg/m       Objective:   Physical Exam  General Mental Status- Alert. General Appearance- Not in acute distress.   Skin General: Color- Normal Color. Moisture- Normal Moisture.  Neck Carotid Arteries- Normal color. Moisture- Normal Moisture. No carotid bruits. No JVD.  Chest and Lung Exam Auscultation: Breath Sounds:-Normal.  Cardiovascular Auscultation:Rythm- Regular. Murmurs & Other Heart Sounds:Auscultation of the heart reveals- No Murmurs.  Abdomen Inspection:-Inspeection Normal. Palpation/Percussion:Note:No mass. Palpation and Percussion of the abdomen reveal- Non Tender, Non Distended + BS, no rebound or guarding.   Neurologic Cranial Nerve exam:- CN III-XII intact(No nystagmus), symmetric smile. Strength:- 5/5 equal and symmetric strength both upper and lower extremities.      Assessment & Plan:  Your a1c is 6.1. Good sugar average. Will continue metformin.  You cholesterol level look good except low hdl. Exercise will increase.  Do recommend stop smoking again. Try patches.  Glad to here that your are getting the colonoscopy.   Continue to follow up with urologist for prostate cancer  treatment.  Follow up in 3 months or as needed.  Mackie Pai, PA-C

## 2018-05-23 NOTE — Patient Instructions (Signed)
Your a1c is 6.1. Good sugar average. Will continue metformin.  You cholesterol level look good except low hdl. Exercise will increase.  Do recommend stop smoking again. Try patches.  Glad to here that your are getting the colonoscopy.   Continue to follow up with urologist for prostate cancer treatment.  Follow up in 3 months or as needed.

## 2018-05-24 ENCOUNTER — Encounter: Payer: Self-pay | Admitting: Gastroenterology

## 2018-05-24 ENCOUNTER — Ambulatory Visit (AMBULATORY_SURGERY_CENTER): Payer: Self-pay

## 2018-05-24 VITALS — Ht 70.0 in | Wt 191.2 lb

## 2018-05-24 DIAGNOSIS — Z86010 Personal history of colonic polyps: Secondary | ICD-10-CM

## 2018-05-24 MED ORDER — PEG 3350-KCL-NA BICARB-NACL 420 G PO SOLR: 4000.0000 mL | Freq: Once | ORAL | 0 refills | Status: AC

## 2018-05-24 NOTE — Progress Notes (Signed)
Denies allergies to eggs or soy products. Denies complication of anesthesia or sedation. Denies use of weight loss medication. Denies use of O2.   Emmi instructions declined.  

## 2018-05-30 IMAGING — DX DG CHEST 2V
2 series · 2 of 2 positions shown · non-contrast
Comparison: 10/10/2014.

CLINICAL DATA: Cough.  Smoking history.

EXAM:
CHEST  2 VIEW

[chest pa]
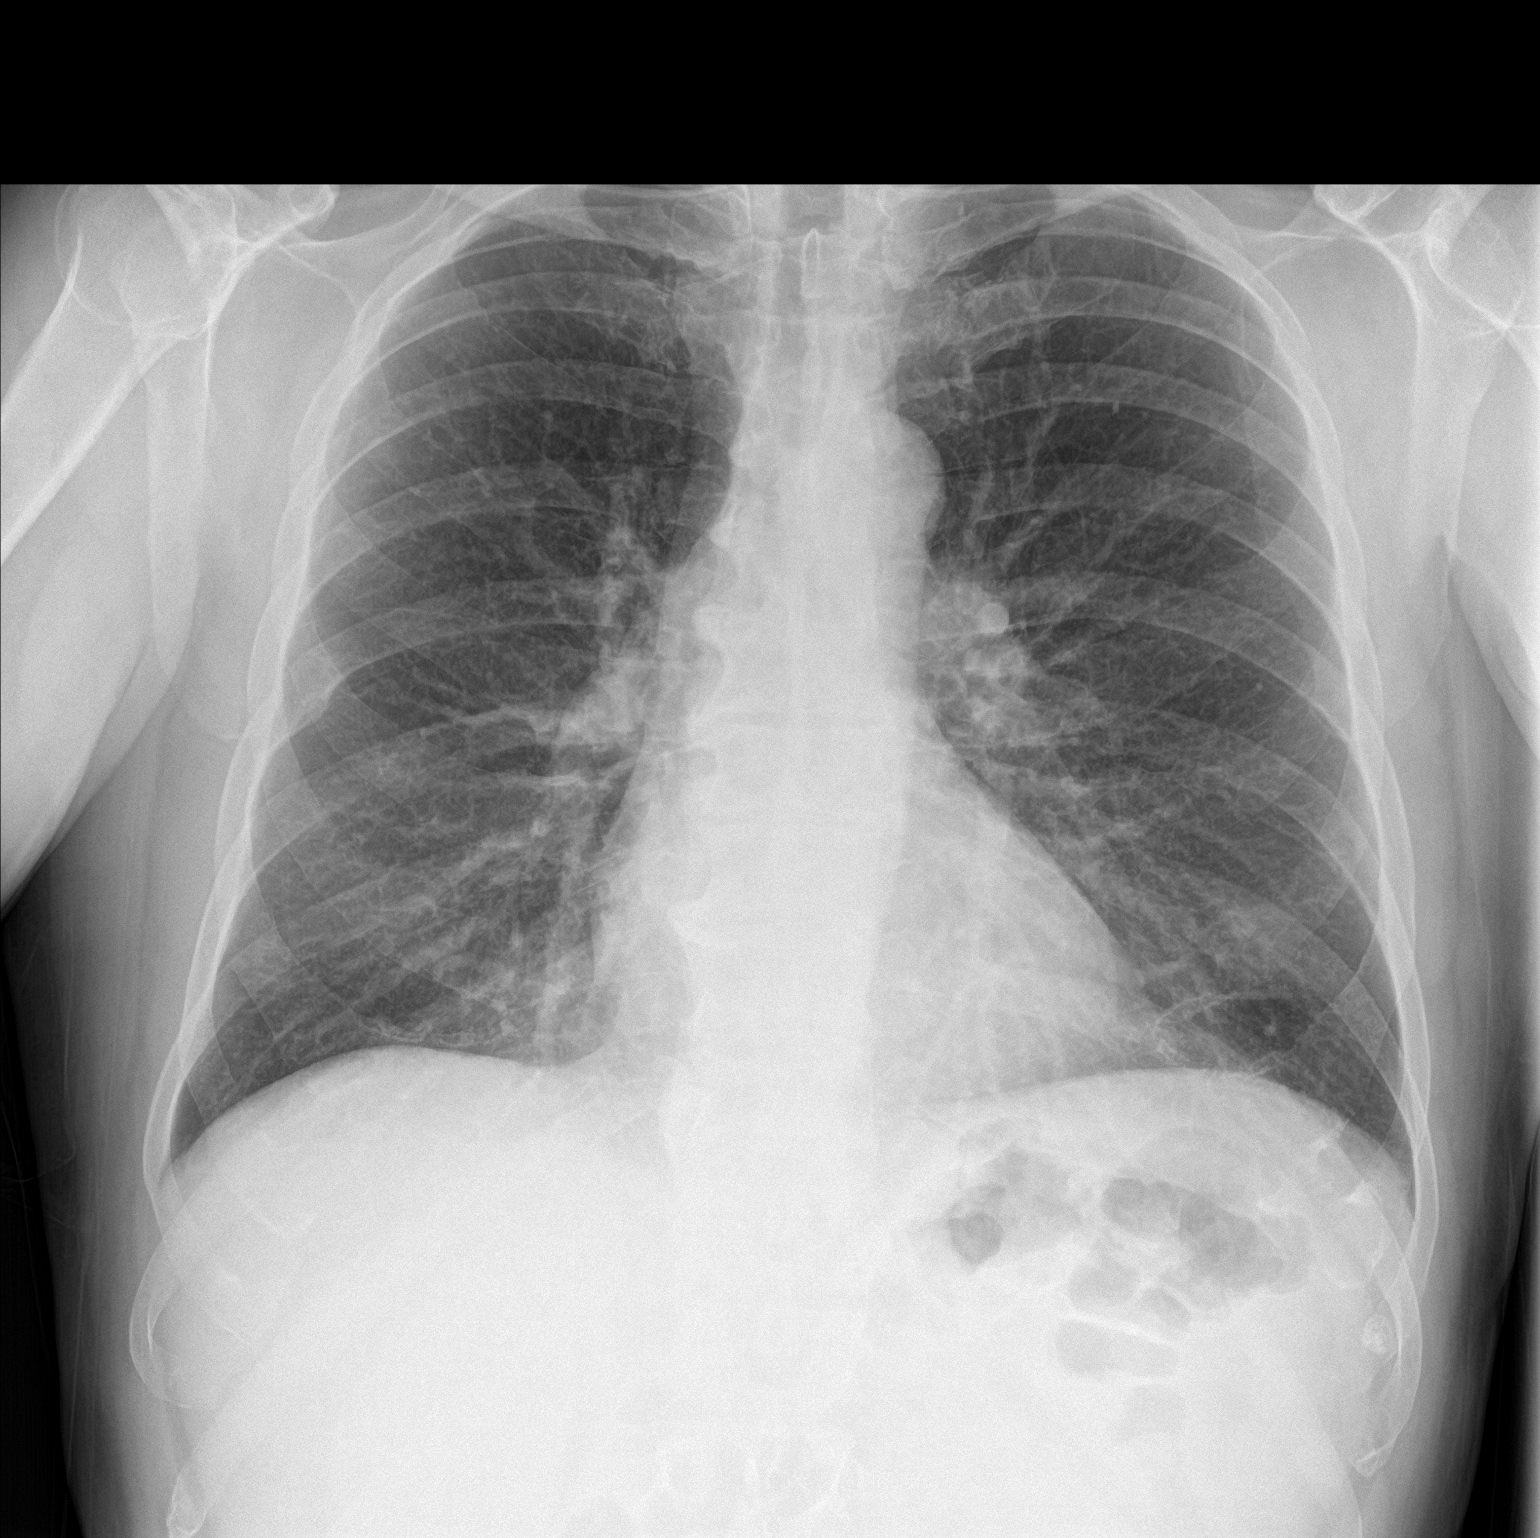

[chest lat]
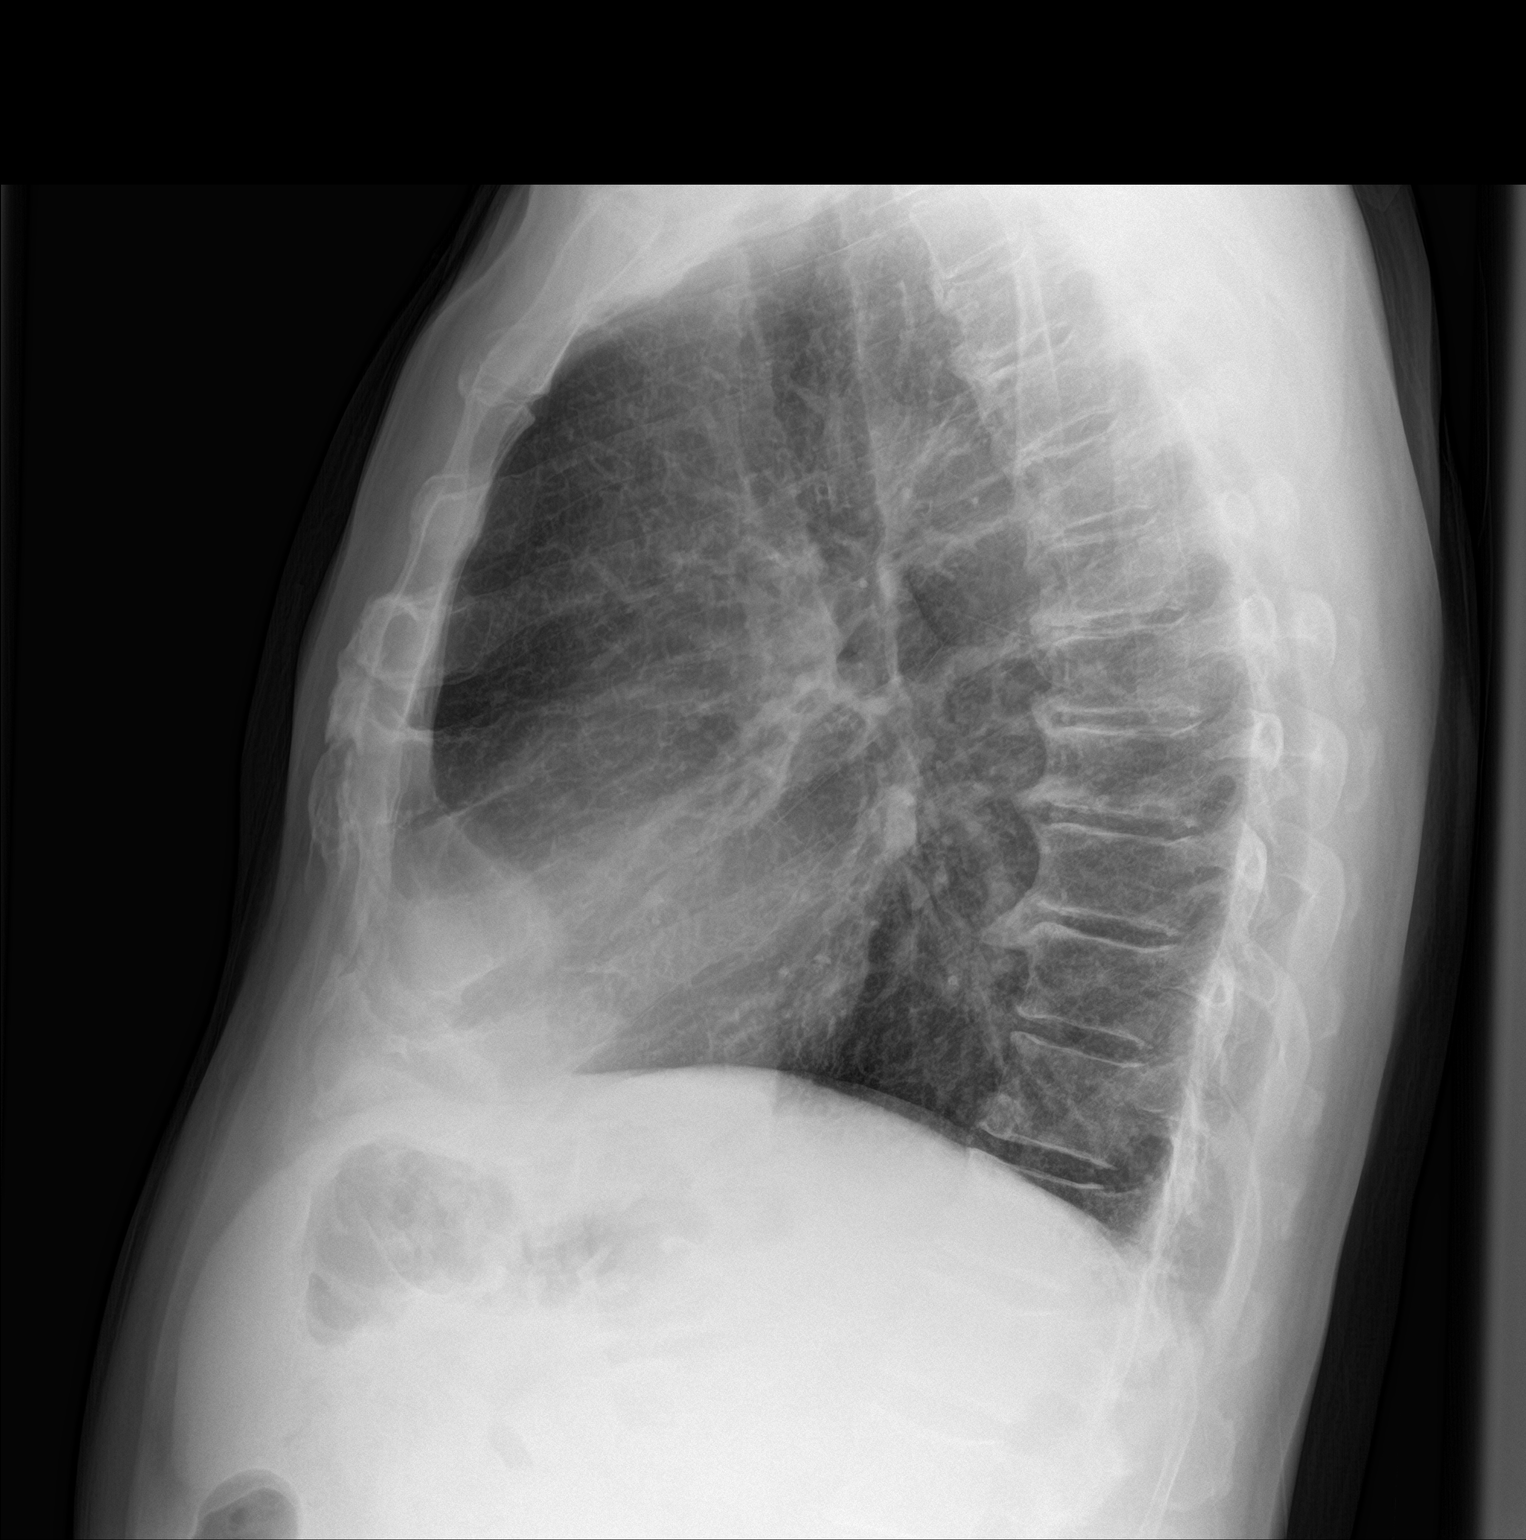

[2 of 2 positions shown; findings below may reference images not displayed]

FINDINGS: Mediastinum and and hilar structures normal. Heart size normal. Mild
bilateral from interstitial prominence again noted consistent
chronic interstitial lung disease. No change noted prior exam . No
focal infiltrate. No pleural effusion or pneumothorax. Old right
posterior seventh rib fracture noted. Degenerative changes thoracic
spine.
IMPRESSION: Diffuse mild bilateral pulmonary interstitial prominence consistent
with chronic interstitial lung disease. No acute abnormality
identified.

## 2018-06-04 ENCOUNTER — Encounter: Payer: 59 | Admitting: Gastroenterology

## 2018-06-13 ENCOUNTER — Encounter: Payer: Self-pay | Admitting: Gastroenterology

## 2018-06-13 ENCOUNTER — Ambulatory Visit (AMBULATORY_SURGERY_CENTER): Payer: 59 | Admitting: Gastroenterology

## 2018-06-13 VITALS — BP 121/78 | HR 72 | Temp 98.4°F | Resp 17 | Ht 72.0 in | Wt 189.0 lb

## 2018-06-13 DIAGNOSIS — Z86010 Personal history of colonic polyps: Secondary | ICD-10-CM

## 2018-06-13 DIAGNOSIS — D124 Benign neoplasm of descending colon: Secondary | ICD-10-CM | POA: Diagnosis not present

## 2018-06-13 DIAGNOSIS — D125 Benign neoplasm of sigmoid colon: Secondary | ICD-10-CM | POA: Diagnosis not present

## 2018-06-13 DIAGNOSIS — D122 Benign neoplasm of ascending colon: Secondary | ICD-10-CM | POA: Diagnosis not present

## 2018-06-13 DIAGNOSIS — K635 Polyp of colon: Secondary | ICD-10-CM

## 2018-06-13 DIAGNOSIS — K5730 Diverticulosis of large intestine without perforation or abscess without bleeding: Secondary | ICD-10-CM

## 2018-06-13 MED ORDER — SODIUM CHLORIDE 0.9 % IV SOLN: 500.0000 mL | Freq: Once | INTRAVENOUS | Status: DC

## 2018-06-13 NOTE — Progress Notes (Signed)
Pt A/O x3, VSS. Report given to RN

## 2018-06-13 NOTE — Progress Notes (Signed)
Called to room to assist during endoscopic procedure.  Patient ID and intended procedure confirmed with present staff. Received instructions for my participation in the procedure from the performing physician.  

## 2018-06-13 NOTE — Patient Instructions (Signed)
YOU HAD AN ENDOSCOPIC PROCEDURE TODAY AT THE [facility]:   Refer to the procedure report that was given to you for any specific questions about what was found during the examination.  If the procedure report does not answer your questions, please call your gastroenterologist to clarify.  If you requested that your care partner not be given the details of your procedure findings, then the procedure report has been included in a sealed envelope for you to review at your convenience later.  YOU SHOULD EXPECT: Some feelings of bloating in the abdomen. Passage of more gas than usual.  Walking can help get rid of the air that was put into your GI tract during the procedure and reduce the bloating. If you had a lower endoscopy (such as a colonoscopy or flexible sigmoidoscopy) you may notice spotting of blood in your stool or on the toilet paper. If you underwent a bowel prep for your procedure, you may not have a normal bowel movement for a few days.  Please Note:  You might notice some irritation and congestion in your nose or some drainage.  This is from the oxygen used during your procedure.  There is no need for concern and it should clear up in a day or so.  SYMPTOMS TO REPORT IMMEDIATELY:   Following lower endoscopy (colonoscopy or flexible sigmoidoscopy):  Excessive amounts of blood in the stool  Significant tenderness or worsening of abdominal pains  Swelling of the abdomen that is new, acute  Fever of 100F or higher   For urgent or emergent issues, a gastroenterologist can be reached at any hour by calling [phone].   DIET:  We do recommend a small meal at first, but then you may proceed to your regular diet.  Drink plenty of fluids but you should avoid alcoholic beverages for 24 hours. Try to increase the fiber in your diet, and drink plenty of water.  ACTIVITY:  You should plan to take it easy for the rest of today and you should NOT DRIVE or use heavy machinery until  tomorrow (because of the sedation medicines used during the test).    FOLLOW UP: Our staff will call the number listed on your records the next business day following your procedure to check on you and address any questions or concerns that you may have regarding the information given to you following your procedure. If we do not reach you, we will leave a message.  However, if you are feeling well and you are not experiencing any problems, there is no need to return our call.  We will assume that you have returned to your regular daily activities without incident.  If any biopsies were taken you will be contacted by phone or by letter within the next 1-3 weeks.  Please call us at [phone] if you have not heard about the biopsies in 3 weeks.    SIGNATURES/CONFIDENTIALITY: You and/or your care partner have signed paperwork which will be entered into your electronic medical record.  These signatures attest to the fact that that the information above on your After Visit Summary has been reviewed and is understood.  Full responsibility of the confidentiality of this discharge information lies with you and/or your care-partner.  Read all handouts given to you by your recovery room nurse. 

## 2018-06-14 ENCOUNTER — Telehealth: Payer: Self-pay

## 2018-06-14 NOTE — Telephone Encounter (Signed)
  Follow up Call-  Call back number 06/13/2018  Post procedure Call Back phone  # [phone]  Permission to leave phone message Yes  Some recent data might be hidden     Patient questions:  Do you have a fever, pain , or abdominal swelling? No. Pain Score  0 *  Have you tolerated food without any problems? Yes.    Have you been able to return to your normal activities? Yes.    Do you have any questions about your discharge instructions: Diet   No. Medications  No. Follow up visit  No.  Do you have questions or concerns about your Care? No.  Actions: * If pain score is 4 or above: No action needed, pain <4.

## 2018-06-14 NOTE — Op Note (Signed)
[place] Patient Name: Alec [name] Procedure Date: 06/13/2018 2:08 PM MRN: [id number] Endoscopist: Milus Banister , MD Age: 62 Referring MD:  Date of Birth: [birth year] Gender: Male Account #: [id number] Procedure:                Colonoscopy Indications:              High risk colon cancer surveillance: Personal                            history of colonic polyps; colonoscopy 2011 three                            subCM polyps removed, two of them were adenomatous. Medicines:                Monitored Anesthesia Care Procedure:                Pre-Anesthesia Assessment:                           - Prior to the procedure, a History and Physical                            was performed, and patient medications and                            allergies were reviewed. [name] tolerance of                            previous anesthesia was also reviewed. The risks                            and benefits of the procedure and the sedation                            options and risks were discussed with the patient.                            All questions were answered, and informed consent                            was obtained. Prior Anticoagulants: The patient has                            taken no previous anticoagulant or antiplatelet                            agents. ASA Grade Assessment: II - A patient with                            mild systemic disease. After reviewing the risks                            and benefits, the patient was deemed in  satisfactory condition to undergo the procedure.                           After obtaining informed consent, the colonoscope                            was passed under direct vision. Throughout the                            procedure, the patient's blood pressure, pulse, and                            oxygen saturations were monitored continuously. The                            Model  CF-HQ190L [id number]) scope was introduced                            through the anus and advanced to the the cecum,                            identified by appendiceal orifice and ileocecal                            valve. The colonoscopy was performed without                            difficulty. The patient tolerated the procedure                            well. The quality of the bowel preparation was                            good. The ileocecal valve, appendiceal orifice, and                            rectum were photographed. Scope In: 2:15:24 PM Scope Out: 2:31:59 PM Scope Withdrawal Time: 0 hours 6 minutes 28 seconds  Total Procedure Duration: 0 hours 16 minutes 35 seconds  Findings:                 Two sessile polyps were found in the sigmoid colon                            and ascending colon. The polyps were 3 to 4 mm in                            size. These polyps were removed with a cold snare.                            Resection and retrieval were complete.                           Multiple small and large-mouthed diverticula were  found in the left colon.                           The exam was otherwise without abnormality on                            direct and retroflexion views. Complications:            No immediate complications. Estimated blood loss:                            None. Estimated Blood Loss:     Estimated blood loss: none. Impression:               - Two 3 to 4 mm polyps in the sigmoid colon and in                            the ascending colon, removed with a cold snare.                            Resected and retrieved.                           - Diverticulosis in the left colon.                           - The examination was otherwise normal on direct                            and retroflexion views. Recommendation:           - Patient has a contact number available for                             emergencies. The signs and symptoms of potential                            delayed complications were discussed with the                            patient. Return to normal activities tomorrow.                            Written discharge instructions were provided to the                            patient.                           - Resume previous diet.                           - Continue present medications.                           You will receive a letter within 2-3 weeks with the  pathology results and my final recommendations.                           If the polyp(s) is proven to be 'pre-cancerous' on                            pathology, you will need repeat colonoscopy in 5                            years. If the polyp(s) is NOT 'precancerous' on                            pathology then you should repeat colon cancer                            screening in 10 years with colonoscopy without need                            for colon cancer screening by any method prior to                            then (including stool testing). Milus Banister, MD 06/13/2018 2:34:24 PM This report has been signed electronically.

## 2018-06-20 ENCOUNTER — Encounter: Payer: Self-pay | Admitting: Gastroenterology

## 2018-07-08 DIAGNOSIS — C61 Malignant neoplasm of prostate: Secondary | ICD-10-CM

## 2018-07-08 HISTORY — DX: Malignant neoplasm of prostate: C61

## 2018-07-09 ENCOUNTER — Encounter: Payer: Self-pay | Admitting: Radiation Oncology

## 2018-07-09 NOTE — Progress Notes (Signed)
GU Location of Tumor / Histology: prostatic adenocarcinoma  If Prostate Cancer, Gleason Score is (3 + 3) and PSA is (4.02). Prostate volume: 30 cc.   Alec [name] was referred by Richardson Dopp PA to Dr. Jeffie Pollock in September for further evaluation of an elevated PSA.   Biopsies of prostate (if applicable) revealed:    Past/Anticipated interventions by urology, if any: prostate biopsy, referral for consideration of radiotherapy  Past/Anticipated interventions by medical oncology, if any: no  Weight changes, if any: no  Bowel/Bladder complaints, if any: IPSS 17. SHIM 17. Denies dysuria or hematuria. Reports occasional leakage unrelated to stress or urgency.   Nausea/Vomiting, if any: no  Pain issues, if any:  no  SAFETY ISSUES:  Prior radiation? no  Pacemaker/ICD? no  Possible current pregnancy? no, male patient  Is the patient on methotrexate? no  Current Complaints / other details:  62 year old male. Married with one stepson. 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  Most interested in brachytherapy. Dr. Tammi Klippel tx his wife ten years ago for multiple myeloma.

## 2018-07-10 ENCOUNTER — Encounter: Payer: Self-pay | Admitting: Medical Oncology

## 2018-07-10 ENCOUNTER — Other Ambulatory Visit: Payer: Self-pay

## 2018-07-10 ENCOUNTER — Encounter: Payer: Self-pay | Admitting: Radiation Oncology

## 2018-07-10 ENCOUNTER — Ambulatory Visit
Admission: RE | Admit: 2018-07-10 | Discharge: 2018-07-10 | Disposition: A | Discharge status: Home / Self Care | Payer: 59 | Source: Ambulatory Visit | Attending: Radiation Oncology | Admitting: Radiation Oncology

## 2018-07-10 DIAGNOSIS — E119 Type 2 diabetes mellitus without complications: Secondary | ICD-10-CM | POA: Diagnosis not present

## 2018-07-10 DIAGNOSIS — Z7984 Long term (current) use of oral hypoglycemic drugs: Secondary | ICD-10-CM | POA: Diagnosis not present

## 2018-07-10 DIAGNOSIS — F17210 Nicotine dependence, cigarettes, uncomplicated: Secondary | ICD-10-CM | POA: Diagnosis not present

## 2018-07-10 DIAGNOSIS — Z79899 Other long term (current) drug therapy: Secondary | ICD-10-CM | POA: Insufficient documentation

## 2018-07-10 DIAGNOSIS — C61 Malignant neoplasm of prostate: Secondary | ICD-10-CM

## 2018-07-10 HISTORY — DX: Malignant neoplasm of prostate: C61

## 2018-07-10 NOTE — Progress Notes (Signed)
See progress note under physician encounter. 

## 2018-07-10 NOTE — Progress Notes (Signed)
Introduced myself to Alec [name] and his wife as the prostate nurse navigator and my role. He has no family history of prostate cancer. He is most interested in brachytherapy. He feels this is the best treatment option for him that will not interfere with his work life. His wife informs me she is currently being treated for multiple myeloma and was treated by Dr. Tammi Klippel ten years go. She is doing well. I gave them my business card and asked them to call with questions or concerns. He voiced understanding.

## 2018-07-10 NOTE — Progress Notes (Signed)
Radiation Oncology         [phone] ________________________________  Initial outpatient Consultation  Name: Alec [name] MRN: [id number]  Date: 07/10/2018  DOB: [birth year]  JM:[id number], Blanchard Mane, MD   REFERRING PHYSICIAN: Irine Seal, MD  DIAGNOSIS: 62 y.o. 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    ICD-10-CM   1. Malignant neoplasm of prostate ([place]) C61     HISTORY OF PRESENT ILLNESS: Alec [name] is a 62 y.o. male with a diagnosis of prostate cancer. He was noted to have an elevated PSA of 4.02 by his primary care physician, Richardson Dopp PA.  Accordingly, he was referred for evaluation in urology by Dr. Jeffie Pollock on 03/23/18,  digital rectal examination was performed at that time revealing no nodules.  The patient proceeded to transrectal ultrasound with 12 biopsies of the prostate on 04/06/18.  The prostate volume measured 31 cc.  Out of 12 core biopsies, 2 were positive.  The maximum Gleason score was 3+3, and this was seen in right base and mid.  The patient reviewed the biopsy results with his urologist and he has kindly been referred today for discussion of potential radiation treatment options.   PREVIOUS RADIATION THERAPY: No  PAST MEDICAL HISTORY:  Past Medical History:  Diagnosis Date  . ALLERGIC RHINITIS   . Allergy   . Arthritis   . Diabetes mellitus without complication ([place])   . DIVERTICULITIS, HX OF   . GERD   . LEG PAIN, LEFT   . Prostate cancer ([place])   . SINUSITIS, CHRONIC   . SMOKER       PAST SURGICAL HISTORY: Past Surgical History:  Procedure Laterality Date  . FINGER SURGERY    . PROSTATE BIOPSY    . SPINE SURGERY     lumbar surgery 2012.    FAMILY HISTORY:  Family History  Problem Relation Age of Onset  . Alzheimer's disease Mother   . Heart disease Father   . Parkinsonism Father   . Alcohol abuse Brother   . Colon cancer Neg Hx   . Esophageal cancer  Neg Hx   . Liver cancer Neg Hx   . Pancreatic cancer Neg Hx   . Rectal cancer Neg Hx   . Stomach cancer Neg Hx     SOCIAL HISTORY:  Social History   Socioeconomic History  . Marital status: Married    Spouse name: Not on file  . Number of children: Not on file  . Years of education: Not on file  . Highest education level: Not on file  Occupational History  . Occupation: Ship broker  . Financial resource strain: Not on file  . Food insecurity:    Worry: Not on file    Inability: Not on file  . Transportation needs:    Medical: Not on file    Non-medical: Not on file  Tobacco Use  . Smoking status: Current Every Day Smoker    Packs/day: 1.50    Years: 40.00    Pack years: 60.00  . Smokeless tobacco: Never Used  Substance and Sexual Activity  . Alcohol use: No  . Drug use: No  . Sexual activity: Yes  Lifestyle  . Physical activity:    Days per week: Not on file    Minutes per session: Not on file  . Stress: Not on file  Relationships  . Social connections:    Talks on phone:  Not on file    Gets together: Not on file    Attends religious service: Not on file    Active member of club or organization: Not on file    Attends meetings of clubs or organizations: Not on file    Relationship status: Not on file  . Intimate partner violence:    Fear of current or ex partner: Not on file    Emotionally abused: Not on file    Physically abused: Not on file    Forced sexual activity: Not on file  Other Topics Concern  . 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  MEDICATIONS:  Current Outpatient Medications  Medication Sig Dispense Refill  . acetaminophen (TYLENOL) 500 MG tablet Take 500 mg by mouth as needed.      Marland Kitchen glucose blood (ONETOUCH VERIO) test strip Use as instructed 100 each 12  . metFORMIN (GLUCOPHAGE) 500 MG  tablet Take 1 tablet (500 mg total) by mouth 2 (two) times daily with a meal. 60 tablet 11  . Multiple Vitamin (MULTIVITAMIN) capsule Take 1 capsule by mouth daily.    Marland Kitchen omeprazole (PRILOSEC) 20 MG capsule Take 20 mg by mouth daily.      Glory Rosebush DELICA LANCETS 70Y MISC Use as directed. 100 each 12  . OVER THE COUNTER MEDICATION Vitamin C, one tablet daily.    Marland Kitchen OVER THE COUNTER MEDICATION Vitamin D, One tablet daily.     No current facility-administered medications for this encounter.     REVIEW OF SYSTEMS:  On review of systems, the patient reports that he is doing well overall. He denies any chest pain, shortness of breath, cough, fevers, chills, night sweats, unintended weight changes. He denies any bowel disturbances, and denies abdominal pain, nausea or vomiting. He denies any new musculoskeletal or joint aches or pains. His IPSS was 17, indicating severe urinary symptoms. He reports occasional leakage but notes it's unrelated to stress or urgency. His SHIM was 17, indicating he does have mild erectile dysfunction. A complete review of systems is obtained and is otherwise negative.    PHYSICAL EXAM:  Wt Readings from Last 3 Encounters:  07/10/18 185 lb 9.6 oz (84.2 kg)  06/13/18 189 lb (85.7 kg)  05/24/18 191 lb 3.2 oz (86.7 kg)   Temp Readings from Last 3 Encounters:  07/10/18 98 F (36.7 C) (Oral)  06/13/18 98.4 F (36.9 C) (Temporal)  05/23/18 97.9 F (36.6 C) (Oral)   BP Readings from Last 3 Encounters:  07/10/18 130/75  06/13/18 121/78  05/23/18 112/61   Pulse Readings from Last 3 Encounters:  07/10/18 65  06/13/18 72  05/23/18 62   Pain Assessment Pain Score: 0-No pain/10  In general this is a well appearing Caucasian gentleman in no acute distress. He is alert and oriented x4 and appropriate throughout the examination. HEENT reveals that the patient is normocephalic, atraumatic. EOMs are intact. PERRLA. Skin is intact without any evidence of gross lesions.  Cardiovascular exam reveals a regular rate and rhythm, no clicks rubs or murmurs are auscultated. Chest is clear to auscultation bilaterally. Lymphatic assessment is performed and does not reveal any adenopathy in the cervical, supraclavicular, axillary, or inguinal chains. Abdomen has active bowel sounds in all quadrants and is intact. The abdomen is soft, non tender, non distended. Lower extremities are negative for pretibial pitting edema, deep  calf tenderness, cyanosis or clubbing.   KPS = 100  100 - Normal; no complaints; no evidence of disease. 90   - Able to carry on normal activity; minor signs or symptoms of disease. 80   - Normal activity with effort; some signs or symptoms of disease. 28   - Cares for self; unable to carry on normal activity or to do active work. 60   - Requires occasional assistance, but is able to care for most of his personal needs. 50   - Requires considerable assistance and frequent medical care. 58   - Disabled; requires special care and assistance. 34   - Severely disabled; hospital admission is indicated although death not imminent. 87   - Very sick; hospital admission necessary; active supportive treatment necessary. 10   - Moribund; fatal processes progressing rapidly. 0     - Dead  Karnofsky DA, Abelmann [place], Craver LS and Burchenal [facility] [phone]) The use of the nitrogen mustards in the palliative treatment of carcinoma: with particular reference to bronchogenic carcinoma Cancer 1 634-56  LABORATORY DATA:  Lab Results  Component Value Date   WBC 8.2 07/13/2016   HGB 12.9 (L) 07/13/2016   HCT 39.9 07/13/2016   MCV 63.8 Repeated and verified X2. (L) 07/13/2016   PLT 217.0 07/13/2016   Lab Results  Component Value Date   NA 137 05/15/2018   K 4.2 05/15/2018   CL 99 05/15/2018   CO2 32 05/15/2018   Lab Results  Component Value Date   ALT 19 05/15/2018   AST 17 05/15/2018   ALKPHOS 60 05/15/2018   BILITOT 1.0 05/15/2018     RADIOGRAPHY: No  results found.    IMPRESSION/PLAN: 1. 62 y.o. gentleman with Stage T1c adenocarcinoma of the prostate with Gleason Score of 3+3, and PSA of 4.02. We discussed the patient's workup and outlines the nature of prostate cancer in this setting. [name] T stage, Gleason's score, and PSA put him into the low risk group. Accordingly, he is eligible for a variety of potential treatment options including brachytherapy, 5.5 weeks of external radiation, or prostatectomy. We discussed the available radiation techniques, and focused on the details and logistics and delivery. We discussed and outlined the risks, benefits, short and long-term effects associated with radiotherapy and compared and contrasted these with prostatectomy. We discussed the role of SpaceOAR in reducing the rectal toxicity associated with radiotherapy.   At the end of the conversation the patient is interested in moving forward with brachytherapy and use of SpaceOAR to reduce rectal toxicity from radiotherapy.  We will share our discussion with Dr. Jeffie Pollock and move forward with scheduling his [facility] planning appointment in the near future.  The patient met briefly with Romie Jumper in our office who will be working closely with him to coordinate OR scheduling and pre and post procedure appointments.  We will contact the pharmaceutical rep to ensure that [place] is available at the time of procedure.  He will have a prostate MRI following his post-seed CT SIM to confirm appropriate distribution of the [place].  We spent 60 minutes face to face with the patient and more than 50% of that time was spent in counseling and/or coordination of care.    Nicholos Johns, PA-C    Tyler Pita, MD  [facility] Direct Dial: [phone]  Fax: [phone] .com  Skype  LinkedIn   This document serves as a record of services personally performed by Tyler Pita, MD and  Ashlyn Bruning, PA-C. It was  created on their behalf by Wilburn Mylar, a trained medical scribe. The creation of this record is based on the scribe's personal observations and the provider's statements to them. This document has been checked and approved by the attending provider.

## 2018-07-13 ENCOUNTER — Other Ambulatory Visit: Payer: Self-pay | Admitting: Urology

## 2018-07-16 ENCOUNTER — Telehealth: Payer: Self-pay | Admitting: *Deleted

## 2018-07-16 NOTE — Telephone Encounter (Signed)
CALLED PATIENT TO INFORM OF PRE-SEED PLANNING CT AND IMPLANT, LVM FOR A RETURN CALL 

## 2018-07-26 ENCOUNTER — Telehealth: Payer: Self-pay | Admitting: *Deleted

## 2018-07-26 NOTE — Telephone Encounter (Signed)
CALLED PATIENT TO REMIND OF PRE-SEED APPT. FOR 07-27-18, LVM FOR A RETURN CALL

## 2018-07-27 ENCOUNTER — Ambulatory Visit
Admission: RE | Admit: 2018-07-27 | Discharge: 2018-07-27 | Disposition: A | Discharge status: Home / Self Care | Payer: 59 | Source: Ambulatory Visit | Attending: Radiation Oncology | Admitting: Radiation Oncology

## 2018-07-27 ENCOUNTER — Encounter: Payer: Self-pay | Admitting: Medical Oncology

## 2018-07-27 ENCOUNTER — Ambulatory Visit
Admission: RE | Admit: 2018-07-27 | Discharge: 2018-07-27 | Disposition: A | Discharge status: Home / Self Care | Payer: 59 | Source: Ambulatory Visit | Attending: Urology | Admitting: Urology

## 2018-07-27 DIAGNOSIS — Z510 Encounter for antineoplastic radiation therapy: Secondary | ICD-10-CM | POA: Diagnosis present

## 2018-07-27 DIAGNOSIS — C61 Malignant neoplasm of prostate: Secondary | ICD-10-CM | POA: Diagnosis not present

## 2018-07-27 NOTE — Progress Notes (Signed)
  Radiation Oncology         [phone]) [phone] ________________________________  Name: Alec [name] MRN: [id number]  Date: 07/27/2018  DOB: 05-13-1956  SIMULATION AND TREATMENT PLANNING NOTE PUBIC ARCH STUDY  [id number], Blanchard Mane, MD  DIAGNOSIS: 63 y.o. gentleman with Stage T1c adenocarcinoma of the prostate with Gleason score of 3+3, and PSA of 4.02     ICD-10-CM   1. Malignant neoplasm of prostate ([place]) C61     COMPLEX SIMULATION:  The patient presented today for evaluation for possible prostate seed implant. He was brought to the radiation planning suite and placed supine on the CT couch. A 3-dimensional image study set was obtained in upload to the planning computer. There, on each axial slice, I contoured the prostate gland. Then, using three-dimensional radiation planning tools I reconstructed the prostate in view of the structures from the transperineal needle pathway to assess for possible pubic arch interference. In doing so, I did not appreciate any pubic arch interference. Also, the patient's prostate volume was estimated based on the drawn structure. The volume was 31 cc.  Given the pubic arch appearance and prostate volume, patient remains a good candidate to proceed with prostate seed implant. Today, he freely provided informed written consent to proceed.    PLAN: The patient will undergo prostate seed implant.   ________________________________  Sheral Apley. Tammi Klippel, M.D.   This document serves as a record of services personally performed by Tyler Pita, MD. It was created on his behalf by Wilburn Mylar, a trained medical scribe. The creation of this record is based on the scribe's personal observations and the provider's statements to them. This document has been checked and approved by the attending provider.

## 2018-07-30 ENCOUNTER — Telehealth: Payer: Self-pay | Admitting: *Deleted

## 2018-07-30 NOTE — Telephone Encounter (Signed)
CALLED PATIENT TO INFORM OF CHEST X-RAY AND EKG FOR 07-31-18 - ARRIVAL TIME- 8:45 AM @ [facility] ADMITTING, LVM FOR A RETURN CALL

## 2018-07-31 ENCOUNTER — Encounter (HOSPITAL_COMMUNITY)
Admission: RE | Admit: 2018-07-31 | Discharge: 2018-07-31 | Disposition: A | Discharge status: Home / Self Care | Payer: 59 | Source: Ambulatory Visit | Attending: Urology | Admitting: Urology

## 2018-07-31 ENCOUNTER — Ambulatory Visit (HOSPITAL_COMMUNITY)
Admission: RE | Admit: 2018-07-31 | Discharge: 2018-07-31 | Disposition: A | Discharge status: Home / Self Care | Payer: 59 | Source: Ambulatory Visit | Attending: Urology | Admitting: Urology

## 2018-07-31 DIAGNOSIS — C61 Malignant neoplasm of prostate: Secondary | ICD-10-CM | POA: Diagnosis not present

## 2018-08-16 ENCOUNTER — Other Ambulatory Visit: Payer: Self-pay | Admitting: Emergency Medicine

## 2018-08-16 DIAGNOSIS — E119 Type 2 diabetes mellitus without complications: Secondary | ICD-10-CM

## 2018-08-23 ENCOUNTER — Other Ambulatory Visit (INDEPENDENT_AMBULATORY_CARE_PROVIDER_SITE_OTHER): Payer: 59

## 2018-08-23 DIAGNOSIS — E119 Type 2 diabetes mellitus without complications: Secondary | ICD-10-CM

## 2018-08-23 LAB — COMPREHENSIVE METABOLIC PANEL
ALT: 16 U/L (ref 0–53)
AST: 14 U/L (ref 0–37)
Albumin: 4.7 g/dL (ref 3.5–5.2)
Alkaline Phosphatase: 63 U/L (ref 39–117)
BUN: 18 mg/dL (ref 6–23)
CO2: 31 mEq/L (ref 19–32)
Calcium: 9.4 mg/dL (ref 8.4–10.5)
Chloride: 102 mEq/L (ref 96–112)
Creatinine, Ser: 0.96 mg/dL (ref 0.40–1.50)
GFR: 79.3 mL/min (ref >60.00)
Glucose, Bld: 119 mg/dL — ABNORMAL HIGH (ref 70–99)
Potassium: 4.7 mEq/L (ref 3.5–5.1)
Sodium: 139 mEq/L (ref 135–145)
Total Bilirubin: 0.7 mg/dL (ref 0.2–1.2)
Total Protein: 6.7 g/dL (ref 6.0–8.3)

## 2018-08-23 LAB — LIPID PANEL
Cholesterol: 136 mg/dL (ref 0–200)
HDL: 37.7 mg/dL — ABNORMAL LOW (ref >39.00)
LDL Cholesterol: 81 mg/dL (ref 0–99)
NonHDL: 98.75
Total CHOL/HDL Ratio: 4
Triglycerides: 89 mg/dL (ref 0.0–149.0)
VLDL: 17.8 mg/dL (ref 0.0–40.0)

## 2018-08-23 LAB — HEMOGLOBIN A1C: Hgb A1c MFr Bld: 6.3 % (ref 4.6–6.5)

## 2018-08-30 ENCOUNTER — Encounter: Payer: Self-pay | Admitting: Medical

## 2018-08-30 ENCOUNTER — Other Ambulatory Visit: Payer: Self-pay | Admitting: Urology

## 2018-08-30 ENCOUNTER — Ambulatory Visit: Payer: 59 | Admitting: Medical

## 2018-08-30 VITALS — BP 124/77 | HR 71 | Temp 98.0°F | Resp 16 | Ht 70.0 in | Wt 192.6 lb

## 2018-08-30 DIAGNOSIS — E786 Lipoprotein deficiency: Secondary | ICD-10-CM

## 2018-08-30 DIAGNOSIS — E118 Type 2 diabetes mellitus with unspecified complications: Secondary | ICD-10-CM | POA: Diagnosis not present

## 2018-08-30 DIAGNOSIS — R0981 Nasal congestion: Secondary | ICD-10-CM

## 2018-08-30 DIAGNOSIS — C61 Malignant neoplasm of prostate: Secondary | ICD-10-CM

## 2018-08-30 DIAGNOSIS — F17200 Nicotine dependence, unspecified, uncomplicated: Secondary | ICD-10-CM | POA: Diagnosis not present

## 2018-08-30 DIAGNOSIS — J0100 Acute maxillary sinusitis, unspecified: Secondary | ICD-10-CM

## 2018-08-30 MED ORDER — ROSUVASTATIN CALCIUM 10 MG PO TABS: 10.0000 mg | ORAL_TABLET | Freq: Every day | ORAL | 3 refills | Status: DC

## 2018-08-30 MED ORDER — FLUTICASONE PROPIONATE 50 MCG/ACT NA SUSP: 2.0000 | Freq: Every day | NASAL | 1 refills | Status: DC

## 2018-08-30 MED ORDER — AZITHROMYCIN 250 MG PO TABS: ORAL_TABLET | ORAL | 0 refills | Status: DC

## 2018-08-30 NOTE — Progress Notes (Signed)
Subjective:    Patient ID: Alec [name], male    DOB: 1956-06-07, 63 y.o.   MRN: [id number]  HPI  Pt in for follow up.  Pt update me that his prostate biopsy came back + for cancer. He is getting seeds place in March, 2020.  Pt a1c was 6.3. Sugar is still controlled. Taking metformin  Cholesterol is still controlled but hdl is low.   Metabolic panel is well controlled.  Pt is aware of his ascvd is very high. Still smoking.   Recent nasal congestion, productive cough and sinus pressure for at least 2 months.    Review of Systems  Constitutional: Negative for chills, fatigue and fever.  HENT: Positive for congestion and sinus pressure. Negative for sinus pain.   Respiratory: Negative for cough, chest tightness, shortness of breath and wheezing.   Cardiovascular: Negative for chest pain and palpitations.  Gastrointestinal: Negative for abdominal pain and blood in stool.  Musculoskeletal: Negative for back pain and neck pain.  Skin: Negative for rash.  Neurological: Negative for dizziness, speech difficulty, weakness, numbness and headaches.  Hematological: Negative for adenopathy. Does not bruise/bleed easily.  Psychiatric/Behavioral: Negative for behavioral problems and confusion.    Past Medical History:  Diagnosis Date  . ALLERGIC RHINITIS   . Allergy   . Arthritis   . Diabetes mellitus without complication ([place])   . DIVERTICULITIS, HX OF   . GERD   . LEG PAIN, LEFT   . Prostate cancer ([place])   . SINUSITIS, CHRONIC   . SMOKER      Social History   Socioeconomic History  . Marital status: Married    Spouse name: Not on file  . Number of children: Not on file  . Years of education: Not on file  . Highest education level: Not on file  Occupational History  . Occupation: Ship broker  . Financial resource strain: Not on file  . Food insecurity:    Worry: Not on file    Inability: Not on file  . Transportation needs:    Medical: Not on  file    Non-medical: Not on file  Tobacco Use  . Smoking status: Current Every Day Smoker    Packs/day: 1.50    Years: 40.00    Pack years: 60.00  . Smokeless tobacco: Never Used  Substance and Sexual Activity  . Alcohol use: No  . Drug use: No  . Sexual activity: Yes  Lifestyle  . Physical activity:    Days per week: Not on file    Minutes per session: Not on file  . Stress: Not on file  Relationships  . Social connections:    Talks on phone: Not on file    Gets together: Not on file    Attends religious service: Not on file    Active member of club or organization: Not on file    Attends meetings of clubs or organizations: Not on file    Relationship status: Not on file  . Intimate partner violence:    Fear of current or ex partner: Not on file    Emotionally abused: Not on file    Physically abused: Not on file    Forced sexual activity: Not on file  Other Topics Concern  . 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 FINGER SURGERY    .  PROSTATE BIOPSY    . SPINE SURGERY     lumbar surgery 2012.    Family History  Problem Relation Age of Onset  . Alzheimer's disease Mother   . Heart disease Father   . Parkinsonism Father   . Alcohol abuse Brother   . Colon cancer Neg Hx   . Esophageal cancer Neg Hx   . Liver cancer Neg Hx   . Pancreatic cancer Neg Hx   . Rectal cancer Neg Hx   . Stomach cancer Neg Hx     No Known Allergies  Current Outpatient Medications on File Prior to Visit  Medication Sig Dispense Refill  . acetaminophen (TYLENOL) 500 MG tablet Take 500 mg by mouth as needed.      Marland Kitchen glucose blood (ONETOUCH VERIO) test strip Use as instructed 100 each 12  . metFORMIN (GLUCOPHAGE) 500 MG tablet Take 1 tablet (500 mg total) by mouth 2 (two) times daily with a meal. 60 tablet 11  . Multiple Vitamin (MULTIVITAMIN)  capsule Take 1 capsule by mouth daily.    Marland Kitchen omeprazole (PRILOSEC) 20 MG capsule Take 20 mg by mouth daily.      Glory Rosebush DELICA LANCETS 53U MISC Use as directed. 100 each 12  . OVER THE COUNTER MEDICATION Vitamin C, one tablet daily.    Marland Kitchen OVER THE COUNTER MEDICATION Vitamin D, One tablet daily.     No current facility-administered medications on file prior to visit.     BP 124/77   Pulse 71   Temp 98 F (36.7 C) (Oral)   Resp 16   Ht 5\' 10"  (1.778 m)   Wt 192 lb 9.6 oz (87.4 kg)   SpO2 99%   BMI 27.64 kg/m       Objective:   Physical Exam  General Mental Status- Alert. General Appearance- Not in acute distress.   Skin General: Color- Normal Color. Moisture- Normal Moisture.  Neck Carotid Arteries- Normal color. Moisture- Normal Moisture. No carotid bruits. No JVD.  Chest and Lung Exam Auscultation: Breath Sounds:-Normal.  Cardiovascular Auscultation:Rythm- Regular. Murmurs & Other Heart Sounds:Auscultation of the heart reveals- No Murmurs.  Abdomen Inspection:-Inspeection Normal. Palpation/Percussion:Note:No mass. Palpation and Percussion of the abdomen reveal- Non Tender, Non Distended + BS, no rebound or guarding.    Neurologic Cranial Nerve exam:- CN III-XII intact(No nystagmus), symmetric smile. Strength:- 5/5 equal and symmetric strength both upper and lower extremities.  Lower ext- see quality metrics.  heent- maxillary sinus pressure. Mild enlarged submandibulare.      Assessment & Plan:  For diabetes, I want you to continue with metformin and eat low sugar diet. A1c number is good.  For low hdl and diabetes with hx of smoking will give low dose crestor to reduce cardiovascular risk.  For prostate cancer you will proceed with urology treatment.  For smoking recommend discontinue.  For possible sinus infection and bronchitis, I am prescribing azithromycin and flonase.  Follow up 3 months or as needed  [employer], [employer]

## 2018-08-30 NOTE — Patient Instructions (Addendum)
For diabetes, I want you to continue with metformin and eat low sugar diet. A1c number is good.  For low hdl and diabetes with hx of smoking will give low dose crestor to reduce cardiovascular risk.  For prostate cancer you will proceed with urology treatment.  For smoking recommend discontinue.  For possible sinus infection and bronchitis, I am prescribing azithromycin and flonase.  Follow up 3 months or as needed

## 2018-09-12 ENCOUNTER — Telehealth: Payer: Self-pay | Admitting: *Deleted

## 2018-09-12 NOTE — Telephone Encounter (Signed)
CALLED PATIENT TO INFORM OF LAB APPT. FOR 09-13-18 @ 11 AM @ [facility] ADMITTING, SPOKE WITH PATIENT AND HE IS AWARE OF THIS APPT.

## 2018-09-13 ENCOUNTER — Encounter (HOSPITAL_COMMUNITY)
Admission: RE | Admit: 2018-09-13 | Discharge: 2018-09-13 | Disposition: A | Discharge status: Home / Self Care | Payer: 59 | Source: Ambulatory Visit | Attending: Urology | Admitting: Urology

## 2018-09-13 ENCOUNTER — Other Ambulatory Visit: Payer: Self-pay

## 2018-09-13 DIAGNOSIS — Z01812 Encounter for preprocedural laboratory examination: Secondary | ICD-10-CM | POA: Diagnosis present

## 2018-09-13 LAB — COMPREHENSIVE METABOLIC PANEL
ALT: 19 U/L (ref 0–44)
AST: 17 U/L (ref 15–41)
Albumin: 4.5 g/dL (ref 3.5–5.0)
Alkaline Phosphatase: 56 U/L (ref 38–126)
Anion gap: 8 (ref 5–15)
BUN: 18 mg/dL (ref 8–23)
CO2: 27 mmol/L (ref 22–32)
Calcium: 8.9 mg/dL (ref 8.9–10.3)
Chloride: 102 mmol/L (ref 98–111)
Creatinine, Ser: 0.89 mg/dL (ref 0.61–1.24)
GFR calc Af Amer: >60 mL/min (ref >60)
GFR calc non Af Amer: >60 mL/min (ref >60)
Glucose, Bld: 112 mg/dL — ABNORMAL HIGH (ref 70–99)
Potassium: 3.9 mmol/L (ref 3.5–5.1)
Sodium: 137 mmol/L (ref 135–145)
Total Bilirubin: 0.7 mg/dL (ref 0.3–1.2)
Total Protein: 7.1 g/dL (ref 6.5–8.1)

## 2018-09-13 LAB — CBC
HCT: 40.3 % (ref 39.0–52.0)
Hemoglobin: 12.1 g/dL — ABNORMAL LOW (ref 13.0–17.0)
MCH: 19.9 pg — ABNORMAL LOW (ref 26.0–34.0)
MCHC: 30 g/dL (ref 30.0–36.0)
MCV: 66.3 fL — ABNORMAL LOW (ref 80.0–100.0)
Platelets: 211 10*3/uL (ref 150–400)
RBC: 6.08 MIL/uL — ABNORMAL HIGH (ref 4.22–5.81)
RDW: 17.7 % — ABNORMAL HIGH (ref 11.5–15.5)
WBC: 6.7 10*3/uL (ref 4.0–10.5)
nRBC: 0 % (ref 0.0–0.2)

## 2018-09-13 LAB — PROTIME-INR
INR: 1 (ref 0.8–1.2)
Prothrombin Time: 13.1 seconds (ref 11.4–15.2)

## 2018-09-13 LAB — APTT: aPTT: 36 seconds (ref 24–36)

## 2018-09-14 ENCOUNTER — Encounter (HOSPITAL_BASED_OUTPATIENT_CLINIC_OR_DEPARTMENT_OTHER): Payer: Self-pay | Admitting: *Deleted

## 2018-09-14 ENCOUNTER — Other Ambulatory Visit: Payer: Self-pay

## 2018-09-14 NOTE — Progress Notes (Signed)
Spoke with [facility]po after midnight, arrive 530 am 09-20-18 [facility] No meds to take, fleets enema am 09-20-18 Records on chart/epic: ekg and chest xray done 07-31-18, labs done 09-13-18 cbc, cmet, pt, ptt Has surgery orders in epic Driver wife vicky

## 2018-09-19 ENCOUNTER — Telehealth: Payer: Self-pay | Admitting: *Deleted

## 2018-09-19 NOTE — Telephone Encounter (Signed)
CALLED PATIENT TO REMIND OF PROCEDURE FOR 09-20-18, LVM FOR A RETURN CALL

## 2018-09-19 NOTE — Anesthesia Preprocedure Evaluation (Addendum)
Anesthesia Evaluation  Patient identified by MRN, date of birth, ID band Patient awake    Reviewed: Allergy & Precautions, NPO status , Patient's Chart, lab work & pertinent test results  Airway Mallampati: II  TM Distance: >3 FB Neck ROM: Full    Dental no notable dental hx. (+) Teeth Intact, Dental Advisory Given   Pulmonary neg pulmonary ROS, Current Smoker,    Pulmonary exam normal breath sounds clear to auscultation       Cardiovascular negative cardio ROS Normal cardiovascular exam Rhythm:Regular Rate:Normal     Neuro/Psych negative neurological ROS  negative psych ROS   GI/Hepatic Neg liver ROS, GERD  ,  Endo/Other  negative endocrine ROSdiabetes, Type 2, Oral Hypoglycemic Agents  Renal/GU negative Renal ROS  negative genitourinary   Musculoskeletal  (+) Arthritis , Osteoarthritis,    Abdominal   Peds  Hematology negative hematology ROS (+)   Anesthesia Other Findings Prostate cancer  Reproductive/Obstetrics                           Anesthesia Physical Anesthesia Plan  ASA: II  Anesthesia Plan: General   Post-op Pain Management:    Induction: Intravenous  PONV Risk Score and Plan: 1 and Ondansetron, Dexamethasone and Midazolam  Airway Management Planned: LMA and Oral ETT  Additional Equipment:   Intra-op Plan:   Post-operative Plan: Extubation in OR  Informed Consent: I have reviewed the patients History and Physical, chart, labs and discussed the procedure including the risks, benefits and alternatives for the proposed anesthesia with the patient or authorized representative who has indicated his/her understanding and acceptance.     Dental advisory given  Plan Discussed with: CRNA  Anesthesia Plan Comments:         Anesthesia Quick Evaluation

## 2018-09-19 NOTE — H&P (Signed)
CC/HPI: Pt presents today for pre-operative history and physical exam in anticipation of radioactive seed implant by Dr. Jeffie Pollock on 09/20/18. Pt is doing well and is without complaint.  Pt denies F/C, HA, CP, SOB, N/V, diarrhea/constipation, back pain, flank pain, hematuria, and dysuria.    HX:     CC/HPI: I have prostate cancer.     Elfego returns today with his wife to discuss his recent biopsy results. He was biopsied for a rising PSA that was 4.02 this year which was up from 3.33 last year. He was found to have a 38ml prostate with 2 cores of Gleason 6(3+3) with 40% in the right mid medial core and 50% in the right base medial core. He has T1c Nx Mx very low risk prostate cancer. My only concern is the rate of PSA rise over the last 3 years and the 2 relatively high volume cores adjacent to one another. His PSAD is 0.134. His IPSS is 8 and his SHIM is 17. The [employer] nomogram predicts and 67% probability of OCD, 33% EPE, 1% LNI and 1% SVI. He is a diabetic only on metformin and is otherwise in good health.     ALLERGIES: No Allergies    MEDICATIONS: Metformin Hcl  Zyrtec  Daily Multiple Vitamins Oral Tablet Oral  Flonase Allergy Relief  Rosuvastatin Calcium  Vitamin C  Vitamin D     GU PSH: Prostate Needle Biopsy - 04/06/2018      PSH Notes: Colonoscopy (Fiberoptic), Back Surgery   NON-GU PSH: Back surgery Diagnostic Colonoscopy - 2013 Surgical Pathology, Gross And Microscopic Examination For Prostate Needle - 04/06/2018    GU PMH: ED due to arterial insufficiency, He has mild ED. - 05/02/2018 Prostate Cancer, T1c Nx Mx very low risk prostate cancer. I discussed Active Surveillance, Radical prostatectomy, EXRT, Brachytherapy, Cryotherapy and HIFU. He has some interest in the Brachytherapy and he is an excellent candidate. He is going to let me know how he would like to proceed. If he decides on Active Surveillance, I would like to get an Oncotype Dx to assess the cancer because of  the size of the primary and rising PSA. - 05/02/2018 BPH w/LUTS, HE has a benign exam and moderate LUTS with mild bother. - 03/23/2018 Elevated PSA, He has an elevated PSA that has been rising progressively. I will get him set up for a prostate [place] and biopsy and reviewed the risks of bleeding, infection and voiding difficulty. I will send levaquin for the prep. - 03/23/2018 Urinary Urgency - 03/23/2018 Hematuria, Unspec, Hematuria - 2014 Personal Hx Oth male genital organs diseases, History of prostatitis - 2014      PMH Notes:  1898-07-11 00:00:00 - Note: Normal Routine History And Physical Adult   NON-GU PMH: Diverticulosis, Diverticulosis - 2014 Personal history of other specified conditions, History of heartburn - 2014 Rectal polyp, Rectal Polyps - 2014 Diabetes Type 2    FAMILY HISTORY: Alzheimer's Disease - Mother Parkinson's Disease - Father   SOCIAL HISTORY: Marital Status: Married Preferred Language: English Current Smoking Status: Patient smokes. Has smoked since 03/11/1978.   Tobacco Use Assessment Completed: Used Tobacco in last 30 days? Smoking cessation counseling was provided. Does not use smokeless tobacco. Has never drank.  Does not use drugs. Drinks 2 caffeinated drinks per day. Has not had a blood transfusion. Patient's occupation TEFL teacher.     Notes: Tobacco use, Occupation:, Marital History - Currently Married, Caffeine Use, Alcohol Use  Smokes 1 1/2 ppd x 45 years  REVIEW OF SYSTEMS:    GU Review Male:   Patient reports leakage of urine. Patient denies frequent urination, hard to postpone urination, burning/ pain with urination, get up at night to urinate, stream starts and stops, trouble starting your stream, have to strain to urinate , erection problems, and penile pain.  Gastrointestinal (Upper):   Patient denies nausea, vomiting, and indigestion/ heartburn.  Gastrointestinal (Lower):   Patient denies diarrhea and constipation.   Constitutional:   Patient denies night sweats, fatigue, weight loss, and fever.  Skin:   Patient denies skin rash/ lesion and itching.  Eyes:   Patient denies blurred vision and double vision.  Ears/ Nose/ Throat:   Patient denies sore throat and sinus problems.  Hematologic/Lymphatic:   Patient denies swollen glands and easy bruising.  Cardiovascular:   Patient denies leg swelling and chest pains.  Respiratory:   Patient denies cough and shortness of breath.  Endocrine:   Patient denies excessive thirst.  Musculoskeletal:   Patient denies back pain and joint pain.  Neurological:   Patient denies headaches and dizziness.  Psychologic:   Patient denies depression and anxiety.   Notes: occasional urge incontinence     VITAL SIGNS:      09/04/2018 01:11 PM  Weight 192 lb / 87.09 kg  Height 69 in / 175.26 cm  BP 114/62 mmHg  Pulse 77 /min  Temperature 98.3 F / 36.8 C  BMI 28.4 kg/m   MULTI-SYSTEM PHYSICAL EXAMINATION:    Constitutional: Well-nourished. No physical deformities. Normally developed. Good grooming.  Neck: Neck symmetrical, not swollen. Normal tracheal position.  Respiratory: Normal breath sounds. No labored breathing, no use of accessory muscles.   Cardiovascular: Regular rate and rhythm. No murmur, no gallop. Normal temperature.   Lymphatic: No enlargement of neck, axillae, groin.  Skin: No paleness, no jaundice, no cyanosis. No lesion, no ulcer, no rash.  Neurologic / Psychiatric: Oriented to time, oriented to place, oriented to person. No depression, no anxiety, no agitation.  Gastrointestinal: No mass, no tenderness, no rigidity, non obese abdomen.  Eyes: Normal conjunctivae. Normal eyelids.  Ears, Nose, Mouth, and Throat: Left ear no scars, no lesions, no masses. Right ear no scars, no lesions, no masses. Nose no scars, no lesions, no masses. Normal hearing. Normal lips.  Musculoskeletal: Normal gait and station of head and neck.     PAST DATA REVIEWED:  Source  Of History:  Patient  Records Review:   Previous Patient Records  Urine Test Review:   Urinalysis   01/29/18  PSA  Total PSA 4.02 ng/dl    05/02/18 05/02/18  Urinalysis  Urine Appearance Clear  Clear   Urine Color Yellow  Yellow   Urine Glucose Neg mg/dL Neg   Urine Bilirubin Neg mg/dL Neg   Urine Ketones Neg mg/dL Neg   Urine Specific Gravity 1.020  1.020   Urine Blood Neg ery/uL Neg   Urine pH 6.0  6.0   Urine Protein Trace mg/dL Trace   Urine Urobilinogen 0.2 mg/dL 0.2   Urine Nitrites Neg  Neg   Urine Leukocyte Esterase Neg leu/uL Neg    PROCEDURES:          Urinalysis w/Scope Dipstick Dipstick Cont'd Micro  Color: Yellow Bilirubin: Neg mg/dL WBC/hpf: NS (Not Seen)  Appearance: Clear Ketones: Neg mg/dL RBC/hpf: 3 - 10/hpf  Specific Gravity: 1.020 Blood: Trace ery/uL Bacteria: NS (Not Seen)  pH: 6.0 Protein: Neg mg/dL Cystals: NS (Not Seen)  Glucose: Neg mg/dL Urobilinogen: 0.2 mg/dL Casts:  NS (Not Seen)    Nitrites: Neg Trichomonas: Not Present    Leukocyte Esterase: Neg leu/uL Mucous: Not Present      Epithelial Cells: 0 - 5/hpf      Yeast: NS (Not Seen)      Sperm: Not Present    ASSESSMENT:      ICD-10 Details  1 GU:   Prostate Cancer - C61    PLAN:           Schedule Return Visit/Planned Activity: Keep Scheduled Appointment - Schedule Surgery          Document Letter(s):  Created for Patient: Clinical Summary         Notes:   There are no changes in the patients history or physical exam since last evaluation by Dr. Jeffie Pollock. Pt is scheduled to undergo radioactive seed implant on 09/20/18.   All pt's questions were answered to the best of my ability.         Next Appointment:      Next Appointment: 09/20/2018 07:30 AM    Appointment Type: Surgery     Location: [facility] [phone]    Provider: Irine Seal, M.D.    Reason for Visit: [place]

## 2018-09-20 ENCOUNTER — Other Ambulatory Visit: Payer: Self-pay

## 2018-09-20 ENCOUNTER — Ambulatory Visit (HOSPITAL_COMMUNITY): Payer: 59

## 2018-09-20 ENCOUNTER — Ambulatory Visit (HOSPITAL_BASED_OUTPATIENT_CLINIC_OR_DEPARTMENT_OTHER): Payer: 59 | Admitting: Anesthesiology

## 2018-09-20 ENCOUNTER — Encounter (HOSPITAL_BASED_OUTPATIENT_CLINIC_OR_DEPARTMENT_OTHER): Payer: Self-pay | Admitting: *Deleted

## 2018-09-20 ENCOUNTER — Ambulatory Visit (HOSPITAL_BASED_OUTPATIENT_CLINIC_OR_DEPARTMENT_OTHER)
Admission: RE | Admit: 2018-09-20 | Discharge: 2018-09-20 | Disposition: A | Discharge status: Home / Self Care | Payer: 59 | Attending: Urology | Admitting: Urology

## 2018-09-20 ENCOUNTER — Encounter (HOSPITAL_BASED_OUTPATIENT_CLINIC_OR_DEPARTMENT_OTHER)
Admission: RE | Disposition: A | Discharge status: Home / Self Care | Payer: Self-pay | Source: Home / Self Care | Attending: Urology

## 2018-09-20 DIAGNOSIS — Z86010 Personal history of colonic polyps: Secondary | ICD-10-CM | POA: Diagnosis not present

## 2018-09-20 DIAGNOSIS — Z820 Family history of epilepsy and other diseases of the nervous system: Secondary | ICD-10-CM | POA: Insufficient documentation

## 2018-09-20 DIAGNOSIS — K219 Gastro-esophageal reflux disease without esophagitis: Secondary | ICD-10-CM | POA: Diagnosis not present

## 2018-09-20 DIAGNOSIS — F17200 Nicotine dependence, unspecified, uncomplicated: Secondary | ICD-10-CM | POA: Insufficient documentation

## 2018-09-20 DIAGNOSIS — M1990 Unspecified osteoarthritis, unspecified site: Secondary | ICD-10-CM | POA: Diagnosis not present

## 2018-09-20 DIAGNOSIS — Z7984 Long term (current) use of oral hypoglycemic drugs: Secondary | ICD-10-CM | POA: Insufficient documentation

## 2018-09-20 DIAGNOSIS — C61 Malignant neoplasm of prostate: Secondary | ICD-10-CM | POA: Insufficient documentation

## 2018-09-20 DIAGNOSIS — Z79899 Other long term (current) drug therapy: Secondary | ICD-10-CM | POA: Insufficient documentation

## 2018-09-20 DIAGNOSIS — E118 Type 2 diabetes mellitus with unspecified complications: Secondary | ICD-10-CM | POA: Diagnosis not present

## 2018-09-20 HISTORY — PX: RADIOACTIVE SEED IMPLANT: SHX5150

## 2018-09-20 HISTORY — PX: SPACE OAR INSTILLATION: SHX6769

## 2018-09-20 LAB — GLUCOSE, CAPILLARY
Glucose-Capillary: 132 mg/dL — ABNORMAL HIGH (ref 70–99)
Glucose-Capillary: 130 mg/dL — ABNORMAL HIGH (ref 70–99)

## 2018-09-20 SURGERY — INSERTION, RADIATION SOURCE, PROSTATE
Anesthesia: General | Site: Prostate

## 2018-09-20 MED ORDER — PROPOFOL 10 MG/ML IV BOLUS
INTRAVENOUS | Status: AC
  Filled 2018-09-20: qty 40

## 2018-09-20 MED ORDER — STERILE WATER FOR IRRIGATION IR SOLN
Status: DC | PRN
  Administered 2018-09-20: 500 mL

## 2018-09-20 MED ORDER — DIPHENHYDRAMINE HCL 50 MG/ML IJ SOLN
INTRAMUSCULAR | Status: DC | PRN
  Administered 2018-09-20: 50 mg via INTRAVENOUS

## 2018-09-20 MED ORDER — MIDAZOLAM HCL 2 MG/2ML IJ SOLN
INTRAMUSCULAR | Status: AC
  Filled 2018-09-20: qty 2

## 2018-09-20 MED ORDER — DEXAMETHASONE SODIUM PHOSPHATE 10 MG/ML IJ SOLN
INTRAMUSCULAR | Status: DC | PRN
  Administered 2018-09-20: 10 mg via INTRAVENOUS

## 2018-09-20 MED ORDER — ACETAMINOPHEN 500 MG PO TABS
ORAL_TABLET | ORAL | Status: AC
  Filled 2018-09-20: qty 2

## 2018-09-20 MED ORDER — SODIUM CHLORIDE 0.9 % IV SOLN
INTRAVENOUS | Status: AC | PRN
  Administered 2018-09-20: 1000 mL

## 2018-09-20 MED ORDER — FENTANYL CITRATE (PF) 100 MCG/2ML IJ SOLN
INTRAMUSCULAR | Status: AC
  Filled 2018-09-20: qty 2

## 2018-09-20 MED ORDER — KETOROLAC TROMETHAMINE 30 MG/ML IJ SOLN
INTRAMUSCULAR | Status: DC | PRN
  Administered 2018-09-20: 30 mg via INTRAVENOUS

## 2018-09-20 MED ORDER — ONDANSETRON HCL 4 MG/2ML IJ SOLN
INTRAMUSCULAR | Status: AC
  Filled 2018-09-20: qty 2

## 2018-09-20 MED ORDER — IOHEXOL 300 MG/ML  SOLN
INTRAMUSCULAR | Status: DC | PRN
  Administered 2018-09-20: 7 mL

## 2018-09-20 MED ORDER — FENTANYL CITRATE (PF) 100 MCG/2ML IJ SOLN
INTRAMUSCULAR | Status: DC | PRN
  Administered 2018-09-20: 100 ug via INTRAVENOUS

## 2018-09-20 MED ORDER — DEXAMETHASONE SODIUM PHOSPHATE 10 MG/ML IJ SOLN
INTRAMUSCULAR | Status: AC
  Filled 2018-09-20: qty 1

## 2018-09-20 MED ORDER — LIDOCAINE 2% (20 MG/ML) 5 ML SYRINGE
INTRAMUSCULAR | Status: DC | PRN
  Administered 2018-09-20: 100 mg via INTRAVENOUS

## 2018-09-20 MED ORDER — LIDOCAINE 2% (20 MG/ML) 5 ML SYRINGE
INTRAMUSCULAR | Status: AC
  Filled 2018-09-20: qty 5

## 2018-09-20 MED ORDER — MIDAZOLAM HCL 5 MG/5ML IJ SOLN
INTRAMUSCULAR | Status: DC | PRN
  Administered 2018-09-20: 2 mg via INTRAVENOUS

## 2018-09-20 MED ORDER — FLEET ENEMA 7-19 GM/118ML RE ENEM
1.0000 | ENEMA | Freq: Once | RECTAL | Status: DC
  Filled 2018-09-20: qty 1

## 2018-09-20 MED ORDER — ONDANSETRON HCL 4 MG/2ML IJ SOLN
INTRAMUSCULAR | Status: DC | PRN
  Administered 2018-09-20: 4 mg via INTRAVENOUS

## 2018-09-20 MED ORDER — CIPROFLOXACIN IN D5W 400 MG/200ML IV SOLN
400.0000 mg | INTRAVENOUS | Status: AC
  Administered 2018-09-20: 400 mg via INTRAVENOUS
  Filled 2018-09-20: qty 200

## 2018-09-20 MED ORDER — DIPHENHYDRAMINE HCL 50 MG/ML IJ SOLN
INTRAMUSCULAR | Status: AC
  Filled 2018-09-20: qty 1

## 2018-09-20 MED ORDER — FENTANYL CITRATE (PF) 100 MCG/2ML IJ SOLN
25.0000 ug | INTRAMUSCULAR | Status: DC | PRN
  Filled 2018-09-20: qty 1

## 2018-09-20 MED ORDER — PROPOFOL 10 MG/ML IV BOLUS
INTRAVENOUS | Status: DC | PRN
  Administered 2018-09-20: 150 mg via INTRAVENOUS
  Administered 2018-09-20: 50 mg via INTRAVENOUS

## 2018-09-20 MED ORDER — LACTATED RINGERS IV SOLN
INTRAVENOUS | Status: DC
  Administered 2018-09-20: 1000 mL via INTRAVENOUS
  Filled 2018-09-20: qty 1000

## 2018-09-20 MED ORDER — KETOROLAC TROMETHAMINE 30 MG/ML IJ SOLN
INTRAMUSCULAR | Status: AC
  Filled 2018-09-20: qty 1

## 2018-09-20 MED ORDER — TRAMADOL HCL 50 MG PO TABS: 50.0000 mg | ORAL_TABLET | Freq: Four times a day (QID) | ORAL | 0 refills | Status: DC | PRN

## 2018-09-20 MED ORDER — ACETAMINOPHEN 500 MG PO TABS
1000.0000 mg | ORAL_TABLET | Freq: Once | ORAL | Status: AC
  Administered 2018-09-20: 1000 mg via ORAL
  Filled 2018-09-20: qty 2

## 2018-09-20 MED ORDER — SODIUM CHLORIDE (PF) 0.9 % IJ SOLN
INTRAMUSCULAR | Status: DC | PRN
  Administered 2018-09-20: 10 mL

## 2018-09-20 MED ORDER — CIPROFLOXACIN IN D5W 400 MG/200ML IV SOLN
INTRAVENOUS | Status: AC
  Filled 2018-09-20: qty 200

## 2018-09-20 SURGICAL SUPPLY — 46 items
BAG URINE DRAINAGE (UROLOGICAL SUPPLIES) ×3 IMPLANT
BLADE CLIPPER SENSICLIP SURGIC (BLADE) ×3 IMPLANT
CATH FOLEY 2WAY SLVR  5CC 16FR (CATHETERS) ×2
CATH FOLEY 2WAY SLVR 5CC 16FR (CATHETERS) ×1 IMPLANT
CATH ROBINSON RED A/P 16FR (CATHETERS) IMPLANT
CATH ROBINSON RED A/P 20FR (CATHETERS) ×3 IMPLANT
CLOTH BEACON ORANGE TIMEOUT ST (SAFETY) ×3 IMPLANT
CONT SPECI 4OZ STER CLIK (MISCELLANEOUS) ×6 IMPLANT
COVER BACK TABLE 60X90IN (DRAPES) ×3 IMPLANT
COVER MAYO STAND STRL (DRAPES) ×3 IMPLANT
COVER WAND RF STERILE (DRAPES) ×3 IMPLANT
DRSG TEGADERM 4X4.75 (GAUZE/BANDAGES/DRESSINGS) ×4 IMPLANT
DRSG TEGADERM 8X12 (GAUZE/BANDAGES/DRESSINGS) ×4 IMPLANT
GAUZE SPONGE 4X4 12PLY STRL LF (GAUZE/BANDAGES/DRESSINGS) ×2 IMPLANT
GLOVE BIO SURGEON STRL SZ 6 (GLOVE) IMPLANT
GLOVE BIO SURGEON STRL SZ 6.5 (GLOVE) IMPLANT
GLOVE BIO SURGEON STRL SZ7 (GLOVE) IMPLANT
GLOVE BIO SURGEON STRL SZ8 (GLOVE) IMPLANT
GLOVE BIO SURGEONS STRL SZ 6.5 (GLOVE)
GLOVE BIOGEL PI IND STRL 6 (GLOVE) IMPLANT
GLOVE BIOGEL PI IND STRL 6.5 (GLOVE) IMPLANT
GLOVE BIOGEL PI IND STRL 7.0 (GLOVE) IMPLANT
GLOVE BIOGEL PI IND STRL 8 (GLOVE) IMPLANT
GLOVE BIOGEL PI INDICATOR 6 (GLOVE)
GLOVE BIOGEL PI INDICATOR 6.5 (GLOVE)
GLOVE BIOGEL PI INDICATOR 7.0 (GLOVE)
GLOVE BIOGEL PI INDICATOR 8 (GLOVE)
GLOVE ECLIPSE 8.0 STRL XLNG CF (GLOVE) ×7 IMPLANT
GLOVE SURG SS PI 8.0 STRL IVOR (GLOVE) ×6 IMPLANT
GOWN STRL REUS W/ TWL XL LVL3 (GOWN DISPOSABLE) ×1 IMPLANT
GOWN STRL REUS W/TWL XL LVL3 (GOWN DISPOSABLE) ×6 IMPLANT
HOLDER FOLEY CATH W/STRAP (MISCELLANEOUS) ×3 IMPLANT
IMPL SPACEOAR SYSTEM 10ML (Spacer) ×1 IMPLANT
IMPLANT SPACEOAR SYSTEM 10ML (Spacer) ×3 IMPLANT
IV NS 1000ML (IV SOLUTION) ×3
IV NS 1000ML BAXH (IV SOLUTION) ×1 IMPLANT
KIT TURNOVER CYSTO (KITS) ×3 IMPLANT
MARKER SKIN DUAL TIP RULER LAB (MISCELLANEOUS) ×3 IMPLANT
PACK CYSTO (CUSTOM PROCEDURE TRAY) ×3 IMPLANT
Radioactive Seed ×146 IMPLANT
SURGILUBE 2OZ TUBE FLIPTOP (MISCELLANEOUS) IMPLANT
SUT BONE WAX W31G (SUTURE) IMPLANT
SYR 10ML LL (SYRINGE) IMPLANT
UNDERPAD 30X30 (UNDERPADS AND DIAPERS) ×6 IMPLANT
WATER STERILE IRR 3000ML UROMA (IV SOLUTION) ×3 IMPLANT
WATER STERILE IRR 500ML POUR (IV SOLUTION) ×3 IMPLANT

## 2018-09-20 NOTE — Anesthesia Procedure Notes (Signed)
Procedure Name: LMA Insertion Date/Time: 09/20/2018 8:15 AM Performed by: Bonney Aid, CRNA Pre-anesthesia Checklist: Patient identified, Emergency Drugs available, Suction available and Patient being monitored Patient Re-evaluated:Patient Re-evaluated prior to induction Oxygen Delivery Method: Circle system utilized Preoxygenation: Pre-oxygenation with 100% oxygen Induction Type: IV induction Ventilation: Mask ventilation without difficulty LMA: LMA inserted LMA Size: 5.0 Number of attempts: 1 Airway Equipment and Method: Bite block Placement Confirmation: positive ETCO2 Tube secured with: Tape Dental Injury: Teeth and Oropharynx as per pre-operative assessment

## 2018-09-20 NOTE — Interval H&P Note (Signed)
History and Physical Interval Note:  09/20/2018 6:55 AM  Alec [name]  has presented today for surgery, with the diagnosis of PROSTATE CANCER.  The various methods of treatment have been discussed with the patient and family. After consideration of risks, benefits and other options for treatment, the patient has consented to  Procedure(s): RADIOACTIVE SEED IMPLANT/BRACHYTHERAPY IMPLANT (N/A) SPACE OAR INSTILLATION (N/A) as a surgical intervention.  [name] history has been reviewed, patient examined, no change in status, stable for surgery.  I have reviewed the patient's chart and labs.  Questions were answered to the patient's satisfaction.     Alec [name]

## 2018-09-20 NOTE — Anesthesia Postprocedure Evaluation (Signed)
Anesthesia Post Note  Patient: Alec [name]  Procedure(s) Performed: RADIOACTIVE SEED IMPLANT/BRACHYTHERAPY IMPLANT (N/A Prostate) SPACE OAR INSTILLATION (N/A Prostate)     Patient location during evaluation: PACU Anesthesia Type: General Level of consciousness: awake and alert Pain management: pain level controlled Vital Signs Assessment: post-procedure vital signs reviewed and stable Respiratory status: spontaneous breathing, nonlabored ventilation, respiratory function stable and patient connected to nasal cannula oxygen Cardiovascular status: blood pressure returned to baseline and stable Postop Assessment: no apparent nausea or vomiting Anesthetic complications: no    Last Vitals:  Vitals:   09/20/18 1015 09/20/18 1115  BP:  109/68  Pulse:  68  Resp:  16  Temp: 36.8 C 36.4 C  SpO2:  95%    Last Pain:  Vitals:   09/20/18 1100  TempSrc:   PainSc: 0-No pain                 Jillana Selph L Pansie Guggisberg

## 2018-09-20 NOTE — Discharge Instructions (Addendum)
Brachytherapy for Prostate Cancer, Care After ° °This sheet gives you information about how to care for yourself after your procedure. Your health care provider may also give you more specific instructions. If you have problems or questions, contact your health care provider. °What can I expect after the procedure? °After the procedure, it is common to have: °· Trouble passing urine. °· Blood in the urine or semen. °· Constipation. °· Frequent feeling of an urgent need to urinate. °· Bruising, swelling, and tenderness of the area behind the scrotum (perineum). °· Bloating and gas. °· Fatigue. °· Burning or pain in the rectum. °· Problems getting or keeping an erection (erectile dysfunction). °· Nausea. °Follow these instructions at home: °Managing pain, stiffness, and swelling °· If directed, apply ice to the affected area: °? Put ice in a plastic bag. °? Place a towel between your skin and the bag. °? Leave the ice on for 20 minutes, 2-3 times a day. °· Try not to sit directly on the area behind the scrotum. A soft cushion can help with discomfort. °Activity °· Do not drive for 24 hours if you were given a medicine to help you relax (sedative). °· Do not drive or use heavy machinery while taking prescription pain medicine. °· Rest as told by your health care provider. °· Most people can return to normal activities a few days or weeks after the procedure. Ask your health care provider what activities are safe for you. °Eating and drinking °· Drink enough fluid to keep your urine clear or pale yellow. °· Eat a healthy, balanced diet. This includes lean proteins, whole grains, and plenty of fruits and vegetables. °General instructions °· Take over-the-counter and prescription medicines only as told by your health care provider. °· Keep all follow-up visits as told by your health care provider. This is important. You may still need additional treatment. °· Do not take baths, swim, or use a hot tub until your health  care provider approves. Shower and wash the area behind the scrotum gently. °· Do not have sex for one week after the treatment, or until your health care provider approves. °· If you have permanent, low-dose brachytherapy implants: °? Limit close contact with children and pregnant women for 2 months or as told by your health care provider. This is important because of the radiation that is still active in the prostate. °? You may set off radioactive sensors, such as airport screenings. Ask your health care provider for a document that explains your treatment. °? You may be instructed to use a condom during sex for the first 2 months after low-dose brachytherapy. °Contact a health care provider if: °· You have a fever or chills. °· You do not have a bowel movement for 3-4 days after the procedure. °· You have diarrhea for 3-4 days after the procedure. °· You develop any new symptoms, such as problems with urinating or erectile dysfunction. °· You have abdomen (abdominal) pain. °· You have more blood in your urine. °Get help right away if: °· You cannot urinate. °· There is excessive bleeding from your rectum. °· You have unusual drainage coming from your rectum. °· You have severe pain in the treated area that does not go away with pain medicine. °· You have severe nausea or vomiting. °Summary °· If you have permanent, low-dose brachytherapy implants, limit close contact with children and pregnant women for 2 months or as told by your health care provider. This is important because of the radiation   that is still active in the prostate.  Talk with your health care provider about your risk of brachytherapy side effects, such as erectile dysfunction or urinary problems. Your health care provider will be able to recommend possible treatment options.  Keep all follow-up visits as told by your health care provider. This is important. You may need additional treatment. This information is not intended to replace  advice given to you by your health care provider. Make sure you discuss any questions you have with your health care provider. Document Released: 07/30/2010 Document Revised: 07/29/2016 Document Reviewed: 07/29/2016 Elsevier Interactive Patient Education  2019 [place], [place], [place], IBU[place] UNTIL 230 PM TODAY   Post Anesthesia Home Care Instructions  Activity: Get plenty of rest for the remainder of the day. A responsible adult should stay with you for 24 hours following the procedure.  For the next 24 hours, DO NOT: -Drive a car -Paediatric nurse -Drink alcoholic beverages -Take any medication unless instructed by your physician -Make any legal decisions or sign important papers.  Meals: Start with liquid foods such as gelatin or soup. Progress to regular foods as tolerated. Avoid greasy, spicy, heavy foods. If nausea and/or vomiting occur, drink only clear liquids until the nausea and/or vomiting subsides. Call your physician if vomiting continues.  Special Instructions/Symptoms: Your throat may feel dry or sore from the anesthesia or the breathing tube placed in your throat during surgery. If this causes discomfort, gargle with warm salt water. The discomfort should disappear within 24 hours.  If you had a scopolamine patch placed behind your ear for the management of post- operative nausea and/or vomiting:  1. The medication in the patch is effective for 72 hours, after which it should be removed.  Wrap patch in a tissue and discard in the trash. Wash hands thoroughly with soap and water. 2. You may remove the patch earlier than 72 hours if you experience unpleasant side effects which may include dry mouth, dizziness or visual disturbances. 3. Avoid touching the patch. Wash your hands with soap and water after contact with the patch.

## 2018-09-20 NOTE — OR Nursing (Signed)
approx 10:30 cbg in phase 2 130

## 2018-09-20 NOTE — Progress Notes (Signed)
  Radiation Oncology         [phone] ________________________________  Name: Alec [name] MRN: [id number]  Date: 09/20/2018  DOB: [birth year]       Prostate Seed Implant  JQ:BHALPFX, Percell Miller, PA-C  No ref. provider found  DIAGNOSIS: 63 y.o. gentleman with Stage T1c adenocarcinoma of the prostate with Gleason score of 3+3, and PSA of 4.02  PROCEDURE: Insertion of radioactive I-125 seeds into the prostate gland.  RADIATION DOSE: 145 Gy, definitive therapy.  TECHNIQUE: Alec [name] was brought to the operating room with the urologist. He was placed in the dorsolithotomy position. He was catheterized and a rectal tube was inserted. The perineum was shaved, prepped and draped. The ultrasound probe was then introduced into the rectum to see the prostate gland.  TREATMENT DEVICE: A needle grid was attached to the ultrasound probe stand and anchor needles were placed.  3D PLANNING: The prostate was imaged in 3D using a sagittal sweep of the prostate probe. These images were transferred to the planning computer. There, the prostate, urethra and rectum were defined on each axial reconstructed image. Then, the software created an optimized 3D plan and a few seed positions were adjusted. The quality of the plan was reviewed using [employer] information for the target and the following two organs at risk:  Urethra and Rectum.  Then the accepted plan was printed and handed off to the radiation therapist.  Under my supervision, the custom loading of the seeds and spacers was carried out and loaded into sealed vicryl sleeves.  These pre-loaded needles were then placed into the needle holder.Marland Kitchen  PROSTATE VOLUME STUDY:  Using transrectal ultrasound the volume of the prostate was verified to be 37 cc.  SPECIAL TREATMENT PROCEDURE/SUPERVISION AND HANDLING: The pre-loaded needles were then delivered under sagittal guidance. A total of 26 needles were used to deposit 73 seeds in the prostate gland. The  individual seed activity was 0.416 mCi.  SpaceOAR:  Yes  COMPLEX SIMULATION: At the end of the procedure, an anterior radiograph of the pelvis was obtained to document seed positioning and count. Cystoscopy was performed to check the urethra and bladder.  MICRODOSIMETRY: At the end of the procedure, the patient was emitting 0.11 mR/hr at 1 meter. Accordingly, he was considered safe for hospital discharge.  PLAN: The patient will return to the radiation oncology clinic for post implant CT dosimetry in three weeks.   ________________________________  Sheral Apley Tammi Klippel, M.D.

## 2018-09-20 NOTE — Op Note (Signed)
PATIENT:  [name]  PRE-OPERATIVE DIAGNOSIS:  Adenocarcinoma of the prostate  POST-OPERATIVE DIAGNOSIS:  Same  PROCEDURE:  Procedure(s): 1. I-125 radioactive seed implantation 2. Cystoscopy  SURGEON:  Surgeon(s): Irine Seal MD  Radiation oncologist: Dr. Tyler Pita  ANESTHESIA:  General  EBL:  Minimal  DRAINS: 32 French Foley catheter  INDICATION: Alec [name] is a 63 y.o. with Stage T1c  Gleason 6 prostate cancer who has elected brachytherapy for treatment.  Description of procedure: After informed consent the patient was brought to the major OR, placed on the table and administered general anesthesia. He was then moved to the modified lithotomy position with his perineum perpendicular to the floor. His perineum and genitalia were then sterilely prepped. An official timeout was then performed. A 16 French Foley catheter was then placed in the bladder and filled with dilute contrast, a rectal tube was placed in the rectum and the transrectal ultrasound probe was placed in the rectum and affixed to the stand. He was then sterilely draped.  The sterile grid was installed.   Anchor needles were then placed.   Real time ultrasonography was used along with the seed planning software spot-pro version 3.1-00. This was used to develop the seed plan including the number of needles as well as number of seeds required for complete and adequate coverage. Real-time ultrasonography was then used along with the previously developed plan and we implanted a total of 73 seeds using 26 needles for a target dose of 145 Gy. This proceeded without difficulty or complication.  After the seeds were implanted, the guide was removed and the SpaceOAR needle was passed under US guidance to the fat stripe posterior to the prostate in the midline and saline was used to confirm position.   The gel polymer was then injected with good distribution on [place].  The Foley catheter was then removed as  well as the transrectal ultrasound probe and rectal probe. Flexible cystoscopy was then performed using the 17 French flexible scope which revealed a normal urethra throughout its length down to the sphincter which appeared intact. The prostatic urethra was 2cm with bilobar hyperplasia.  The bladder was then entered and fully and systematically inspected.  The ureteral orifices were noted to be of normal configuration and position. The mucosa revealed no evidence of tumors. There were also no stones identified within the bladder.  No seeds or spacers were seen and/or removed from the bladder.  The cystoscope was then removed.  The drapes were removed.  The perineum was cleaned and dressed.  He was taken out of the lithotomy position and was awakened and taken to recovery room in stable and satisfactory condition. He tolerated procedure well and there were no intraoperative complications.

## 2018-09-20 NOTE — Transfer of Care (Signed)
Immediate Anesthesia Transfer of Care Note  Patient: Alec [name]  Procedure(s) Performed: RADIOACTIVE SEED IMPLANT/BRACHYTHERAPY IMPLANT (N/A Prostate) SPACE OAR INSTILLATION (N/A Prostate)  Patient Location: PACU  Anesthesia Type:General  Level of Consciousness: awake, alert  and oriented  Airway & Oxygen Therapy: Patient Spontanous Breathing and Patient connected to nasal cannula oxygen  Post-op Assessment: Report given to RN  Post vital signs: Reviewed and stable  Last Vitals: 133/75, 87, 16, 95% Vitals Value Taken Time  BP    Temp    Pulse    Resp    SpO2      Last Pain:  Vitals:   09/20/18 0643  TempSrc:   PainSc: 0-No pain      Patients Stated Pain Goal: 10 (72/18/28 8337)  Complications: No apparent anesthesia complications

## 2018-09-21 ENCOUNTER — Encounter (HOSPITAL_BASED_OUTPATIENT_CLINIC_OR_DEPARTMENT_OTHER): Payer: Self-pay | Admitting: Urology

## 2018-09-24 ENCOUNTER — Telehealth: Payer: Self-pay | Admitting: Medical

## 2018-09-24 ENCOUNTER — Telehealth: Payer: Self-pay

## 2018-09-24 ENCOUNTER — Encounter: Payer: Self-pay | Admitting: Medical

## 2018-09-24 MED ORDER — HYDROCORTISONE ACE-PRAMOXINE 1-1 % RE FOAM: 1.0000 | Freq: Two times a day (BID) | RECTAL | 0 refills | Status: DC

## 2018-09-24 NOTE — Telephone Encounter (Signed)
Proctofoam sent to pt pharmacy.

## 2018-09-24 NOTE — Telephone Encounter (Signed)
Copied from [place] [id number]. Topic: General - Other >> Sep 24, 2018  8:50 AM Alec [name] wrote: Reason for CRM: Patient called to say that he had prostate seeds implanted on 09/20/2018 and now he is having issues with hemorrhoids asking for something called in to his pharmacy. Per patient Dr Harvie Heck called in [name] spray [name] while ago to the pharmacy that helped instantly with relieving the discomfort. He is asking for this spray again but could not remember the number. Please advise patient can be reached at Ph# [phone]

## 2018-09-25 NOTE — Telephone Encounter (Signed)
Sent in proctofoam last night. Notify pt.

## 2018-09-25 NOTE — Telephone Encounter (Signed)
Mychart message sent.

## 2018-10-08 ENCOUNTER — Telehealth: Payer: Self-pay | Admitting: *Deleted

## 2018-10-08 NOTE — Telephone Encounter (Signed)
RETURNED PATIENT'S PHONE CALL, AND ADDRESSED QUESTION OF MRI, SPOKE WITH PATIENT

## 2018-10-09 ENCOUNTER — Telehealth: Payer: Self-pay | Admitting: *Deleted

## 2018-10-09 NOTE — Telephone Encounter (Signed)
RETURNED PATIENT'S PHONE CALL, SPOKE WITH PATIENT. ?

## 2018-10-10 ENCOUNTER — Telehealth: Payer: Self-pay | Admitting: *Deleted

## 2018-10-10 NOTE — Telephone Encounter (Signed)
CALLED PATIENT TO REMIND OF POST SEED APPT. FOR 10-11-18, SPOKE WITH PATIENT AND HE IS AWARE OF THIS APPT.

## 2018-10-11 ENCOUNTER — Telehealth: Payer: Self-pay | Admitting: Urology

## 2018-10-11 ENCOUNTER — Encounter: Payer: Self-pay | Admitting: Medical Oncology

## 2018-10-11 ENCOUNTER — Ambulatory Visit: Payer: 59 | Admitting: Urology

## 2018-10-11 ENCOUNTER — Other Ambulatory Visit: Payer: Self-pay

## 2018-10-11 ENCOUNTER — Ambulatory Visit
Admission: RE | Admit: 2018-10-11 | Discharge: 2018-10-11 | Disposition: A | Discharge status: Home / Self Care | Payer: 59 | Source: Ambulatory Visit | Attending: Radiation Oncology | Admitting: Radiation Oncology

## 2018-10-11 DIAGNOSIS — C61 Malignant neoplasm of prostate: Secondary | ICD-10-CM

## 2018-10-11 DIAGNOSIS — Z510 Encounter for antineoplastic radiation therapy: Secondary | ICD-10-CM | POA: Diagnosis not present

## 2018-10-11 NOTE — Telephone Encounter (Signed)
I spoke with the patient to conduct his routine scheduled post-seed follow up visit via telephone to spare the patient unnecessary potential exposure in the healthcare setting during the current COVID-19 pandemic.  The patient was notified in advance and gave permission to proceed with this visit format.   Radiation Oncology         [phone] ________________________________  Name: Alec [name] MRN: [id number]  Date: 10/11/2018  DOB: [birth year]  Post-Seed Follow-Up Visit Note  CC: Saguier, Percell Miller, PA-C  No ref. provider found  Diagnosis:   63 y.o. gentleman with Stage T1c adenocarcinoma of the prostate with Gleason score of 3+3, and PSA of 4.02    ICD-10-CM   1. Malignant neoplasm of prostate ([place]) C61     Interval Since Last Radiation:  3 weeks 09/20/18:  Insertion of radioactive I-125 seeds into the prostate gland; 145 Gy, definitive therapy with SpaceOAR gel placement.  Narrative:  The patient reports that he is doing well overall.  He is complaining of persistent dysuria, increased urinary frequency, urgency, weak stream, intermittency and urinary hesitation symptoms. He reports difficulty emptying his bladder but denies gross hematuria, straining or incontinence.  He reports a healthy appetite and is maintaining his weight.  He denies abdominal pain, nausea, vomiting, diarrhea or constipation.  He had a follow-up visit earlier today with Jiles Crocker, NP at Dr. Ralene Muskrat office and was prescribed Pyridium to help with the dysuria and LUTS.  ALLERGIES:  has No Known Allergies.  Meds: Current Outpatient Medications  Medication Sig Dispense Refill  . glucose blood (ONETOUCH VERIO) test strip Use as instructed 100 each 12  . hydrocortisone-pramoxine (PROCTOFOAM HC) rectal foam Place 1 applicator rectally 2 (two) times daily. 10 g 0  . metFORMIN (GLUCOPHAGE) 500 MG tablet Take 1 tablet (500 mg total) by mouth 2 (two) times daily with a meal. 60 tablet 11  . ONETOUCH DELICA  LANCETS 25E MISC Use as directed. 100 each 12  . rosuvastatin (CRESTOR) 10 MG tablet Take 1 tablet (10 mg total) by mouth daily. 30 tablet 3  . traMADol (ULTRAM) 50 MG tablet Take 1 tablet (50 mg total) by mouth every 6 (six) hours as needed. 6 tablet 0   No current facility-administered medications for this visit.     Physical Findings: Not performed due to telephone visit format.  Lab Findings: Lab Results  Component Value Date   WBC 6.7 09/13/2018   HGB 12.1 (L) 09/13/2018   HCT 40.3 09/13/2018   MCV 66.3 (L) 09/13/2018   PLT 211 09/13/2018   Radiographic Findings:  Patient underwent CT imaging in our clinic for post implant dosimetry. The CT will be reviewed by Dr. Tammi Klippel to ensure an adequate distribution of radioactive seeds throughout the prostate gland and confirm that there are no seeds in or near the rectum.  He is planning to have an MRI prostate for verification of space or gel placement but we are still pending insurance approval for this study.  Once the MRI has been completed these images will be fused with his CT images for further evaluation.  We suspect the final radiation plan and dosimetry will show appropriate coverage of the prostate gland.   Impression/Plan: 63 y.o. 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 The patient is recovering from the effects of radiation. His urinary symptoms should gradually improve over the next 4-6 months. We talked about this today. He is encouraged by his improvement  already and is otherwise pleased with his outcome. We also talked about long-term follow-up for prostate cancer following seed implant. He understands that ongoing PSA determinations and digital rectal exams will help perform surveillance to rule out disease recurrence. He has a follow up appointment scheduled with Dr. Jeffie Pollock in July 2020. He understands what to expect with his PSA measures. Patient was also educated  today about some of the long-term effects from radiation including a small risk for rectal bleeding and possibly erectile dysfunction. We talked about some of the general management approaches to these potential complications. However, I did encourage the patient to contact our office or return at any point if he has questions or concerns related to his previous radiation and prostate cancer.    Nicholos Johns, PA-C

## 2018-10-12 NOTE — Progress Notes (Signed)
  Radiation Oncology         [phone] ________________________________  Name: Alec [name] MRN: [id number]  Date: 10/11/2018  DOB: [birth year]  COMPLEX SIMULATION NOTE  NARRATIVE:  The patient was brought to the [facility] today following prostate seed implantation approximately one month ago.  Identity was confirmed.  All relevant records and images related to the planned course of therapy were reviewed.  Then, the patient was set-up supine.  CT images were obtained.  The CT images were loaded into the planning software.  Then the prostate and rectum were contoured.  Treatment planning then occurred.  The implanted iodine 125 seeds were identified by the physics staff for projection of radiation distribution  I have requested : 3D Simulation  I have requested a DVH of the following structures: Prostate and rectum.    ________________________________  Sheral Apley Tammi Klippel, M.D.  This document serves as a record of services personally performed by Tyler Pita, MD. It was created on his behalf by Wilburn Mylar, a trained medical scribe. The creation of this record is based on the scribe's personal observations and the provider's statements to them. This document has been checked and approved by the attending provider.

## 2018-11-30 ENCOUNTER — Telehealth: Payer: Self-pay

## 2018-11-30 DIAGNOSIS — E118 Type 2 diabetes mellitus with unspecified complications: Secondary | ICD-10-CM

## 2018-11-30 DIAGNOSIS — R748 Abnormal levels of other serum enzymes: Secondary | ICD-10-CM

## 2018-11-30 NOTE — Addendum Note (Signed)
Addended by: Anabel Halon on: 11/30/2018 09:02 PM   Modules accepted: Orders

## 2018-11-30 NOTE — Telephone Encounter (Signed)
Copied from [place] [phone]. Topic: General - Other >> Nov 29, 2018  4:02 PM Keene Breath wrote: Reason for CRM: Patient called to schedule an appt. For his medication refill.  Please call patient to schedule.  CB# [phone]

## 2018-11-30 NOTE — Telephone Encounter (Signed)
Placed future lab. So coordinate/make sure he is on schedule.

## 2018-11-30 NOTE — Telephone Encounter (Signed)
Called pt to schedule vov. Pt states he wants labs done first then will schedule vov while here for labs. Pt lab orders need to be placed. Pt scheduled for lab 12/04/18

## 2018-12-04 ENCOUNTER — Other Ambulatory Visit: Payer: Self-pay

## 2018-12-04 ENCOUNTER — Other Ambulatory Visit (INDEPENDENT_AMBULATORY_CARE_PROVIDER_SITE_OTHER): Payer: 59

## 2018-12-04 DIAGNOSIS — E118 Type 2 diabetes mellitus with unspecified complications: Secondary | ICD-10-CM | POA: Diagnosis not present

## 2018-12-04 DIAGNOSIS — R748 Abnormal levels of other serum enzymes: Secondary | ICD-10-CM

## 2018-12-04 LAB — COMPREHENSIVE METABOLIC PANEL
ALT: 21 U/L (ref 0–53)
AST: 18 U/L (ref 0–37)
Albumin: 4.6 g/dL (ref 3.5–5.2)
Alkaline Phosphatase: 58 U/L (ref 39–117)
BUN: 21 mg/dL (ref 6–23)
CO2: 29 mEq/L (ref 19–32)
Calcium: 9.3 mg/dL (ref 8.4–10.5)
Chloride: 103 mEq/L (ref 96–112)
Creatinine, Ser: 0.92 mg/dL (ref 0.40–1.50)
GFR: 83.22 mL/min (ref >60.00)
Glucose, Bld: 110 mg/dL — ABNORMAL HIGH (ref 70–99)
Potassium: 4.5 mEq/L (ref 3.5–5.1)
Sodium: 139 mEq/L (ref 135–145)
Total Bilirubin: 0.8 mg/dL (ref 0.2–1.2)
Total Protein: 6.6 g/dL (ref 6.0–8.3)

## 2018-12-04 LAB — LIPID PANEL
Cholesterol: 104 mg/dL (ref 0–200)
HDL: 35.3 mg/dL — ABNORMAL LOW (ref >39.00)
LDL Cholesterol: 48 mg/dL (ref 0–99)
NonHDL: 68.83
Total CHOL/HDL Ratio: 3
Triglycerides: 103 mg/dL (ref 0.0–149.0)
VLDL: 20.6 mg/dL (ref 0.0–40.0)

## 2018-12-04 LAB — HEMOGLOBIN A1C: Hgb A1c MFr Bld: 6.6 % — ABNORMAL HIGH (ref 4.6–6.5)

## 2018-12-11 ENCOUNTER — Other Ambulatory Visit: Payer: Self-pay

## 2018-12-11 ENCOUNTER — Ambulatory Visit (INDEPENDENT_AMBULATORY_CARE_PROVIDER_SITE_OTHER): Payer: 59 | Admitting: Medical

## 2018-12-11 ENCOUNTER — Telehealth: Payer: Self-pay | Admitting: Medical

## 2018-12-11 ENCOUNTER — Encounter: Payer: Self-pay | Admitting: Medical

## 2018-12-11 VITALS — Ht 70.0 in | Wt 180.0 lb

## 2018-12-11 DIAGNOSIS — E786 Lipoprotein deficiency: Secondary | ICD-10-CM

## 2018-12-11 DIAGNOSIS — F17200 Nicotine dependence, unspecified, uncomplicated: Secondary | ICD-10-CM | POA: Diagnosis not present

## 2018-12-11 DIAGNOSIS — C61 Malignant neoplasm of prostate: Secondary | ICD-10-CM | POA: Diagnosis not present

## 2018-12-11 DIAGNOSIS — E118 Type 2 diabetes mellitus with unspecified complications: Secondary | ICD-10-CM | POA: Diagnosis not present

## 2018-12-11 NOTE — Patient Instructions (Addendum)
Diabetes recently slightly worsened with mild increase of A1c to 6.6.  This coincided with not taking medications prior to surgery and he admits eating poorly.  Advised compliance on medication and better diet compliance.  Stay on current metformin.  History of low LDL and diabetes, advised continue statin.  History of smoking and again counseled to cut back/stop.  He continues to try.  History of prostate cancer and followed by urologist.  He has appointment coming up mid June.  He is taking medication to help with his frequent urination.  He is going to [place] me name of the medication and if possible might make a dose adjustment to help with the symptoms until he sees urologist.  Did counsel patient that would recommend getting a pneumonia vaccine in early fall and flu vaccine.  Historically he is declined pneumonia vaccine but now he is willing to get.    Also did recommend he get over-the-counter electronic blood pressure cuff as this would be helpful for future virtual visits.  Follow-up in 3 months virtual or office visit.  Or as needed.

## 2018-12-11 NOTE — Telephone Encounter (Signed)
Pt is scheduled for his 3 mo f/u visit per AVS instructions from visit with Percell Miller today.  Pt stated on the phone at time of making his appt for Sept 2, that he always get his labs done a week prior to seeing Percell Miller.  Pt is requesting orders be placed and a lab appt be set for him to come in and get those labs done the last week of August.

## 2018-12-11 NOTE — Telephone Encounter (Signed)
lvm for pt to call and schedule 3 mo/fu

## 2018-12-11 NOTE — Telephone Encounter (Signed)
Future lab order placed. Advise pt to get labs done 90 days or more from todays visit and schedule office visit 2-3 days later. If a1c done before 90 days insurance may not cover a1c.

## 2018-12-11 NOTE — Progress Notes (Signed)
Subjective:    Patient ID: Alec [name], male    DOB: 07-19-1955, 63 y.o.   MRN: [id number]  HPI    Virtual Visit via Telephone Note  I connected with Alec [name] on 12/11/18 at  8:20 AM EDT by telephone and verified that I am speaking with the correct person using two identifiers.  Location: Patient: work Provider: home  Pt has checked his bp.   Tried to establish video contact but his camera could not connect. Visit done virtual due to pandemic.    I discussed the limitations, risks, security and privacy concerns of performing an evaluation and management service by telephone and the availability of in person appointments. I also discussed with the patient that there may be a patient responsible charge related to this service. The patient expressed understanding and agreed to proceed.   History of Present Illness:  Pt states post prostate surgery he has some more frequency of urination. He states some difficulty urination. Pt states he can't remember what med he was given. He is taking one tablet a day.  Pt lipid panel is well controlled except mild low hdl. He is on statin.  Pt a1c recently was little high. He admits skipped medicine and he admits eating poorly. Pt is on metformin. Pt states he missed his diabetic eye exam through [facility]. He did have diabetic eye exam with new MD. [place].  Pt continues to smoke. Still smoking about same amount.    Observations/Objective: General- no acute distress. Pleasant, no acute distress, normal speech.  Assessment and Plan:  Diabetes recently slightly worsened with mild increase of A1c to 6.6.  This coincided with not taking medications prior to surgery and he admits eating poorly.  Advised compliance on medication and better diet compliance.  Stay on current metformin.  History of low LDL and diabetes, advised continue statin.  History of smoking and again counseled to cut back/stop.  He continues to try.   History of prostate cancer and followed by urologist.  He has appointment coming up mid June.  He is taking medication to help with his frequent urination.  He is going to [place] name of the medication and if possible might make a dose adjustment to help with the symptoms until he sees urologist.  Did counsel patient that would recommend getting a pneumonia vaccine in early fall and flu vaccine.  Historically he is declined pneumonia vaccine but now he is willing to get.  Also did recommend he get over-the-counter electronic blood pressure cuff as this would be helpful for future virtual visits.  Mackie Pai, PA-C    Follow Up Instructions:    I discussed the assessment and treatment plan with the patient. The patient was provided an opportunity to ask questions and all were answered. The patient agreed with the plan and demonstrated an understanding of the instructions.   The patient was advised to call back or seek an in-person evaluation if the symptoms worsen or if the condition fails to improve as anticipated.  25 minutes spent with the patient.  50% of the time spent counseling patient on diagnoses and plan going forward.  Educated on need/benefit for pneumonia vaccine as well.     Mackie Pai, PA-C    Review of Systems  Constitutional: Negative for chills, fatigue and fever.  HENT: Negative for congestion and ear pain.   Respiratory: Negative for cough, choking, shortness of breath and wheezing.   Cardiovascular: Negative for chest pain and  palpitations.  Gastrointestinal: Negative for abdominal distention and blood in stool.  Genitourinary:       Frequent urination since prostate surgery and radiation.  Musculoskeletal: Negative for back pain and neck pain.  Skin: Negative for rash.  Neurological: Negative for dizziness, speech difficulty, weakness and headaches.  Hematological: Negative for adenopathy. Does not bruise/bleed easily.  Psychiatric/Behavioral:  Negative for behavioral problems and decreased concentration.    Past Medical History:  Diagnosis Date  . ALLERGIC RHINITIS   . Allergy   . Arthritis   . Diabetes mellitus without complication ([place])    type 2  . DIVERTICULITIS, HX OF   . GERD   . LEG PAIN, LEFT   . Prostate cancer ([place])   . SINUSITIS, CHRONIC   . SMOKER      Social History   Socioeconomic History  . Marital status: Married    Spouse name: Not on file  . Number of children: Not on file  . Years of education: Not on file  . Highest education level: Not on file  Occupational History  . Occupation: Ship broker  . Financial resource strain: Not on file  . Food insecurity:    Worry: Not on file    Inability: Not on file  . Transportation needs:    Medical: Not on file    Non-medical: Not on file  Tobacco Use  . Smoking status: Current Every Day Smoker    Packs/day: 1.50    Years: 40.00    Pack years: 60.00  . Smokeless tobacco: Never Used  Substance and Sexual Activity  . Alcohol use: No  . Drug use: No  . Sexual activity: Yes  Lifestyle  . Physical activity:    Days per week: Not on file    Minutes per session: Not on file  . Stress: Not on file  Relationships  . Social connections:    Talks on phone: Not on file    Gets together: Not on file    Attends religious service: Not on file    Active member of club or organization: Not on file    Attends meetings of clubs or organizations: Not on file    Relationship status: Not on file  . Intimate partner violence:    Fear of current or ex partner: Not on file    Emotionally abused: Not on file    Physically abused: Not on file    Forced sexual activity: Not on file  Other Topics Concern  . 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 FINGER SURGERY    . PROSTATE BIOPSY    . RADIOACTIVE  SEED IMPLANT N/A 09/20/2018   Procedure: RADIOACTIVE SEED IMPLANT/BRACHYTHERAPY IMPLANT;  Surgeon: Irine Seal, MD;  Location: [facility];  Service: Urology;  Laterality: N/A;  . SPACE OAR INSTILLATION N/A 09/20/2018   Procedure: SPACE OAR INSTILLATION;  Surgeon: Irine Seal, MD;  Location: [facility];  Service: Urology;  Laterality: N/A;  . SPINE SURGERY     lumbar surgery 2012.    Family History  Problem Relation Age of Onset  . Alzheimer's disease Mother   . Heart disease Father   . Parkinsonism Father   . Alcohol abuse Brother   . Colon cancer Neg Hx   . Esophageal cancer Neg Hx   . Liver  cancer Neg Hx   . Pancreatic cancer Neg Hx   . Rectal cancer Neg Hx   . Stomach cancer Neg Hx     No Known Allergies  Current Outpatient Medications on File Prior to Visit  Medication Sig Dispense Refill  . glucose blood (ONETOUCH VERIO) test strip Use as instructed 100 each 12  . hydrocortisone-pramoxine (PROCTOFOAM HC) rectal foam Place 1 applicator rectally 2 (two) times daily. 10 g 0  . metFORMIN (GLUCOPHAGE) 500 MG tablet Take 1 tablet (500 mg total) by mouth 2 (two) times daily with a meal. 60 tablet 11  . ONETOUCH DELICA LANCETS 84O MISC Use as directed. 100 each 12  . rosuvastatin (CRESTOR) 10 MG tablet Take 1 tablet (10 mg total) by mouth daily. 30 tablet 3  . traMADol (ULTRAM) 50 MG tablet Take 1 tablet (50 mg total) by mouth every 6 (six) hours as needed. 6 tablet 0   No current facility-administered medications on file prior to visit.     Ht 5\' 10"  (1.778 m)   Wt 180 lb (81.6 kg)   BMI 25.83 kg/m       Objective:   Physical Exam        Assessment & Plan:

## 2018-12-12 NOTE — Telephone Encounter (Signed)
Called pt scheduled labs for Aug 27.  Pt does not need to R/S his appt with Percell Miller on 9/2.  Percell Miller was concerned about insurance covering labs, not the visit with him.  Pt's last lab draw for A1C was 12/04/18 so he should be fine with his insurance covering his labs.  Pt agreed with this and said his insurance should be fine with this scheduling.  Pt agreed to keep appts as scheduled for Aug & Sept w/Edward.

## 2018-12-12 NOTE — Telephone Encounter (Signed)
Pt needs to reschedule appt on 9/2 to the following week, due to insurance and it will not be 90 days. Pt needs lab appt 9/2 or after, then following week with edward. No answer at the office.  Advised pt someone will call back to reschedule his appt.

## 2019-01-01 ENCOUNTER — Encounter: Payer: Self-pay | Admitting: Radiation Oncology

## 2019-01-01 ENCOUNTER — Ambulatory Visit
Admission: RE | Admit: 2019-01-01 | Discharge: 2019-01-01 | Disposition: A | Discharge status: Home / Self Care | Payer: 59 | Source: Ambulatory Visit | Attending: Radiation Oncology | Admitting: Radiation Oncology

## 2019-01-01 DIAGNOSIS — C61 Malignant neoplasm of prostate: Secondary | ICD-10-CM | POA: Diagnosis present

## 2019-01-01 DIAGNOSIS — Z510 Encounter for antineoplastic radiation therapy: Secondary | ICD-10-CM | POA: Insufficient documentation

## 2019-01-27 NOTE — Progress Notes (Signed)
  Radiation Oncology         [phone] ________________________________  Name: Alec [name] MRN: [id number]  Date: 01/01/2019  DOB: 1955/10/29  3D Planning Note   Prostate Brachytherapy Post-Implant Dosimetry  Diagnosis: 63 y.o. gentleman with Stage T1c adenocarcinoma of the prostate with Gleason score of 3+3, and PSA of 4.02   Narrative: On a previous date, [name] returned following prostate seed implantation for post implant planning. He underwent CT scan complex simulation to delineate the three-dimensional structures of the pelvis and demonstrate the radiation distribution.  Since that time, the seed localization, and complex isodose planning with dose volume histograms have now been completed.  Results:   Prostate Coverage - The dose of radiation delivered to the 90% or more of the prostate gland (D90) was 99.41% of the prescription dose. This exceeds our goal of greater than 90%. Rectal Sparing - The volume of rectal tissue receiving the prescription dose or higher was 0.0 cc. This falls under our thresholds tolerance of 1.0 cc.  Impression: The prostate seed implant appears to show adequate target coverage and appropriate rectal sparing.  Plan:  The patient will continue to follow with urology for ongoing PSA determinations. I would anticipate a high likelihood for local tumor control with minimal risk for rectal morbidity.  ________________________________  Sheral Apley Tammi Klippel, M.D.

## 2019-03-07 ENCOUNTER — Other Ambulatory Visit: Payer: Self-pay

## 2019-03-07 ENCOUNTER — Other Ambulatory Visit (INDEPENDENT_AMBULATORY_CARE_PROVIDER_SITE_OTHER): Payer: 59

## 2019-03-07 DIAGNOSIS — E118 Type 2 diabetes mellitus with unspecified complications: Secondary | ICD-10-CM | POA: Diagnosis not present

## 2019-03-07 DIAGNOSIS — E786 Lipoprotein deficiency: Secondary | ICD-10-CM

## 2019-03-07 LAB — COMPREHENSIVE METABOLIC PANEL
ALT: 19 U/L (ref 0–53)
AST: 15 U/L (ref 0–37)
Albumin: 4.9 g/dL (ref 3.5–5.2)
Alkaline Phosphatase: 57 U/L (ref 39–117)
BUN: 18 mg/dL (ref 6–23)
CO2: 30 mEq/L (ref 19–32)
Calcium: 9.8 mg/dL (ref 8.4–10.5)
Chloride: 101 mEq/L (ref 96–112)
Creatinine, Ser: 0.94 mg/dL (ref 0.40–1.50)
GFR: 81.11 mL/min (ref >60.00)
Glucose, Bld: 131 mg/dL — ABNORMAL HIGH (ref 70–99)
Potassium: 4.8 mEq/L (ref 3.5–5.1)
Sodium: 140 mEq/L (ref 135–145)
Total Bilirubin: 0.8 mg/dL (ref 0.2–1.2)
Total Protein: 6.9 g/dL (ref 6.0–8.3)

## 2019-03-07 LAB — LIPID PANEL
Cholesterol: 114 mg/dL (ref 0–200)
HDL: 34.6 mg/dL — ABNORMAL LOW (ref >39.00)
LDL Cholesterol: 57 mg/dL (ref 0–99)
NonHDL: 79.72
Total CHOL/HDL Ratio: 3
Triglycerides: 113 mg/dL (ref 0.0–149.0)
VLDL: 22.6 mg/dL (ref 0.0–40.0)

## 2019-03-07 LAB — HEMOGLOBIN A1C: Hgb A1c MFr Bld: 6.6 % — ABNORMAL HIGH (ref 4.6–6.5)

## 2019-03-13 ENCOUNTER — Ambulatory Visit: Payer: 59 | Admitting: Medical

## 2019-03-13 ENCOUNTER — Encounter: Payer: Self-pay | Admitting: Medical

## 2019-03-13 ENCOUNTER — Other Ambulatory Visit: Payer: Self-pay

## 2019-03-13 VITALS — BP 120/69 | HR 83 | Temp 98.0°F | Resp 16 | Ht 70.0 in | Wt 191.4 lb

## 2019-03-13 DIAGNOSIS — C61 Malignant neoplasm of prostate: Secondary | ICD-10-CM

## 2019-03-13 DIAGNOSIS — E118 Type 2 diabetes mellitus with unspecified complications: Secondary | ICD-10-CM

## 2019-03-13 DIAGNOSIS — F17200 Nicotine dependence, unspecified, uncomplicated: Secondary | ICD-10-CM

## 2019-03-13 DIAGNOSIS — R748 Abnormal levels of other serum enzymes: Secondary | ICD-10-CM | POA: Diagnosis not present

## 2019-03-13 NOTE — Patient Instructions (Addendum)
Diabetes recently slightly worsened with mild increase of A1c to 6.6. .  Advised compliance on medication and better diet compliance.  Stay on current metformin.  History of low HDL and diabetes, advised continue statin.  History of smoking and again counseled to cut back/stop.  He continues to try.  History of prostate cancer and followed by urologist.  He has appointment coming up mid oct.    Also recommend he get over-the-counter electronic blood pressure cuff as this would be helpful for future virtual visits.  Follow-up in 3 months virtual or office visit.  Or as needed.

## 2019-03-13 NOTE — Progress Notes (Signed)
Subjective:    Patient ID: Alec [name], male    DOB: [birth year], 63 y.o.   MRN: [id number]  HPI   Pt in for follow up.  Pt has been walking a lot at work. His a1-c went up just a little.  He is on metformin. No side effects.  Pt is on crestor due to hx of smoking and diabetes.  Pt smoking about pack a day. Was smoking pack and half.   Pt is following up with urologist for elevated psa. Pt had seed implant for prostate CA. Upcoming follow up in October.  Pt states never had flu vaccine. Pt declined in the past.   Review of Systems  Constitutional: Negative for chills, fatigue and fever.  Respiratory: Negative for chest tightness, shortness of breath and wheezing.   Cardiovascular: Negative for chest pain and palpitations.  Gastrointestinal: Negative for abdominal pain, blood in stool, constipation, diarrhea, nausea and vomiting.  Genitourinary: Negative for dysuria, flank pain, frequency, testicular pain and urgency.  Musculoskeletal: Negative for back pain, myalgias and neck stiffness.  Skin: Negative for rash.  Hematological: Negative for adenopathy. Does not bruise/bleed easily.  Psychiatric/Behavioral: Negative for behavioral problems.    Past Medical History:  Diagnosis Date  . ALLERGIC RHINITIS   . Allergy   . Arthritis   . Diabetes mellitus without complication ([place])    type 2  . DIVERTICULITIS, HX OF   . GERD   . LEG PAIN, LEFT   . Prostate cancer ([place])   . SINUSITIS, CHRONIC   . SMOKER      Social History   Socioeconomic History  . Marital status: Married    Spouse name: Not on file  . Number of children: Not on file  . Years of education: Not on file  . Highest education level: Not on file  Occupational History  . Occupation: Ship broker  . Financial resource strain: Not on file  . Food insecurity    Worry: Not on file    Inability: Not on file  . Transportation needs    Medical: Not on file    Non-medical: Not on file   Tobacco Use  . Smoking status: Current Every Day Smoker    Packs/day: 1.50    Years: 40.00    Pack years: 60.00  . Smokeless tobacco: Never Used  Substance and Sexual Activity  . Alcohol use: No  . Drug use: No  . Sexual activity: Yes  Lifestyle  . Physical activity    Days per week: Not on file    Minutes per session: Not on file  . Stress: Not on file  Relationships  . Social Herbalist on phone: Not on file    Gets together: Not on file    Attends religious service: Not on file    Active member of club or organization: Not on file    Attends meetings of clubs or organizations: Not on file    Relationship status: Not on file  . Intimate partner violence    Fear of current or ex partner: Not on file    Emotionally abused: Not on file    Physically abused: Not on file    Forced sexual activity: Not on file  Other Topics Concern  . 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  FINGER SURGERY    . PROSTATE BIOPSY    . RADIOACTIVE SEED IMPLANT N/A 09/20/2018   Procedure: RADIOACTIVE SEED IMPLANT/BRACHYTHERAPY IMPLANT;  Surgeon: Irine Seal, MD;  Location: [facility];  Service: Urology;  Laterality: N/A;  . SPACE OAR INSTILLATION N/A 09/20/2018   Procedure: SPACE OAR INSTILLATION;  Surgeon: Irine Seal, MD;  [facility];  Service: Urology;  Laterality: N/A;  . SPINE SURGERY     lumbar surgery 2012.    Family History  Problem Relation Age of Onset  . Alzheimer's disease Mother   . Heart disease Father   . Parkinsonism Father   . Alcohol abuse Brother   . Colon cancer Neg Hx   . Esophageal cancer Neg Hx   . Liver cancer Neg Hx   . Pancreatic cancer Neg Hx   . Rectal cancer Neg Hx   . Stomach cancer Neg Hx     No Known Allergies  Current Outpatient Medications on File Prior to  Visit  Medication Sig Dispense Refill  . glucose blood (ONETOUCH VERIO) test strip Use as instructed 100 each 12  . hydrocortisone-pramoxine (PROCTOFOAM HC) rectal foam Place 1 applicator rectally 2 (two) times daily. 10 g 0  . metFORMIN (GLUCOPHAGE) 500 MG tablet Take 1 tablet (500 mg total) by mouth 2 (two) times daily with a meal. 60 tablet 11  . ONETOUCH DELICA LANCETS 99991111 MISC Use as directed. 100 each 12  . rosuvastatin (CRESTOR) 10 MG tablet Take 1 tablet (10 mg total) by mouth daily. 30 tablet 3  . traMADol (ULTRAM) 50 MG tablet Take 1 tablet (50 mg total) by mouth every 6 (six) hours as needed. 6 tablet 0   No current facility-administered medications on file prior to visit.     BP 120/69   Pulse 83   Temp 98 F (36.7 C) (Temporal)   Resp 16   Ht 5\' 10"  (1.778 m)   Wt 191 lb 6.4 oz (86.8 kg)   SpO2 99%   BMI 27.46 kg/m       Objective:   Physical Exam  General Mental Status- Alert. General Appearance- Not in acute distress.   Skin General: Color- Normal Color. Moisture- Normal Moisture.   Chest and Lung Exam Auscultation: Breath Sounds:-Normal.  Cardiovascular Auscultation:Rythm- Regular. Murmurs & Other Heart Sounds:Auscultation of the heart reveals- No Murmurs.  Abdomen Inspection:-Inspeection Normal. Palpation/Percussion:Note:No mass. Palpation and Percussion of the abdomen reveal- Non Tender, Non Distended + BS, no rebound or guarding.   Neurologic Cranial Nerve exam:- CN III-XII intact(No nystagmus), symmetric smile. Strength:- 5/5 equal and symmetric strength both upper and lower extremities.      Assessment & Plan:  Diabetes recently slightly worsened with mild increase of A1c to 6.6. .  Advised compliance on medication and better diet compliance.  Stay on current metformin.  History of low HDL and diabetes, advised continue statin.  History of smoking and again counseled to cut back/stop.  He continues to try.   History of prostate  cancer and followed by urologist.  He has appointment coming up mid oct.     Also recommend he get over-the-counter electronic blood pressure cuff as this would be helpful for future virtual visits.  Follow-up in 3 months virtual or office visit.  Or as needed.  25 minutes spent with pt. 50% of time spent counseling pt on plan. Advised stop smoking or at least decrease.

## 2019-04-02 ENCOUNTER — Other Ambulatory Visit: Payer: Self-pay | Admitting: Medical

## 2019-04-04 ENCOUNTER — Other Ambulatory Visit: Payer: Self-pay | Admitting: Medical

## 2019-04-04 MED ORDER — LEVOCETIRIZINE DIHYDROCHLORIDE 5 MG PO TABS: 5.0000 mg | ORAL_TABLET | Freq: Every evening | ORAL | 3 refills | Status: DC

## 2019-04-04 NOTE — Telephone Encounter (Signed)
Pt requesting xyzal I don't see medication on pts current medication list. Please advise.

## 2019-04-04 NOTE — Telephone Encounter (Signed)
Rx xyzal sent to pt pharmacy. He does have history of allergies. I did rx xyzal in 2018. Will you see how he is doing. If takes xyzal and symptoms worsen or change then offer virtual visit.

## 2019-05-11 ENCOUNTER — Other Ambulatory Visit: Payer: Self-pay | Admitting: Medical

## 2019-06-12 ENCOUNTER — Other Ambulatory Visit: Payer: 59

## 2019-06-12 NOTE — Addendum Note (Signed)
Addended by: Kelle Darting A on: 06/12/2019 04:12 PM   Modules accepted: Orders

## 2019-06-14 ENCOUNTER — Other Ambulatory Visit (INDEPENDENT_AMBULATORY_CARE_PROVIDER_SITE_OTHER): Payer: 59

## 2019-06-14 DIAGNOSIS — R748 Abnormal levels of other serum enzymes: Secondary | ICD-10-CM

## 2019-06-14 DIAGNOSIS — E118 Type 2 diabetes mellitus with unspecified complications: Secondary | ICD-10-CM

## 2019-06-15 LAB — COMPREHENSIVE METABOLIC PANEL
AG Ratio: 2.3 (calc) (ref 1.0–2.5)
ALT: 19 U/L (ref 9–46)
AST: 17 U/L (ref 10–35)
Albumin: 4.6 g/dL (ref 3.6–5.1)
Alkaline phosphatase (APISO): 66 U/L (ref 35–144)
BUN: 17 mg/dL (ref 7–25)
CO2: 32 mmol/L (ref 20–32)
Calcium: 9.3 mg/dL (ref 8.6–10.3)
Chloride: 100 mmol/L (ref 98–110)
Creat: 1.05 mg/dL (ref 0.70–1.25)
Globulin: 2 g/dL (calc) (ref 1.9–3.7)
Glucose, Bld: 172 mg/dL — ABNORMAL HIGH (ref 65–99)
Potassium: 4.4 mmol/L (ref 3.5–5.3)
Sodium: 140 mmol/L (ref 135–146)
Total Bilirubin: 0.7 mg/dL (ref 0.2–1.2)
Total Protein: 6.6 g/dL (ref 6.1–8.1)

## 2019-06-15 LAB — LIPID PANEL
Cholesterol: 114 mg/dL (ref <200)
HDL: 32 mg/dL — ABNORMAL LOW (ref >=40)
LDL Cholesterol (Calc): 50 mg/dL (calc)
Non-HDL Cholesterol (Calc): 82 mg/dL (calc) (ref <130)
Total CHOL/HDL Ratio: 3.6 (calc) (ref <5.0)
Triglycerides: 302 mg/dL — ABNORMAL HIGH (ref <150)

## 2019-06-15 LAB — HEMOGLOBIN A1C
Hgb A1c MFr Bld: 5.9 % of total Hgb — ABNORMAL HIGH (ref <5.7)
Mean Plasma Glucose: 123 (calc)
eAG (mmol/L): 6.8 (calc)

## 2019-06-17 ENCOUNTER — Encounter: Payer: Self-pay | Admitting: Medical

## 2019-06-17 ENCOUNTER — Ambulatory Visit (INDEPENDENT_AMBULATORY_CARE_PROVIDER_SITE_OTHER): Payer: 59 | Admitting: Medical

## 2019-06-17 ENCOUNTER — Other Ambulatory Visit: Payer: Self-pay

## 2019-06-17 VITALS — Ht 70.0 in

## 2019-06-17 DIAGNOSIS — F17200 Nicotine dependence, unspecified, uncomplicated: Secondary | ICD-10-CM | POA: Diagnosis not present

## 2019-06-17 DIAGNOSIS — R748 Abnormal levels of other serum enzymes: Secondary | ICD-10-CM

## 2019-06-17 DIAGNOSIS — C61 Malignant neoplasm of prostate: Secondary | ICD-10-CM

## 2019-06-17 DIAGNOSIS — E118 Type 2 diabetes mellitus with unspecified complications: Secondary | ICD-10-CM | POA: Diagnosis not present

## 2019-06-17 MED ORDER — ROSUVASTATIN CALCIUM 10 MG PO TABS: 10.0000 mg | ORAL_TABLET | Freq: Every day | ORAL | 3 refills | Status: DC

## 2019-06-17 NOTE — Patient Instructions (Signed)
Your a1c is much better than previous. Keep eating low sugar diet and continue metfromin. This Friday get urine microalbumin.   For high triglycerides and low hdl, recommend increase exercise and low cholesterol diet. It would be recommended to continue crestor due to risk factors and diabetes.  Encouraged to quit smoking/reduce as have in past.  Flu vaccine and pneumonia vaccine this Friday.  Follow up in 3 months or as needed

## 2019-06-17 NOTE — Progress Notes (Signed)
   Subjective:    Patient ID: Alec [name], male    DOB: October 10, 1955, 63 y.o.   MRN: [id number]  HPI    Virtual Visit via Telephone Note  I connected with Alec [name] on 06/17/19 at  8:00 AM EST by telephone and verified that I am speaking with the correct person using two identifiers.  Location: Patient: car Provider: office   I discussed the limitations, risks, security and privacy concerns of performing an evaluation and management service by telephone and the availability of in person appointments. I also discussed with the patient that there may be a patient responsible charge related to this service. The patient expressed understanding and agreed to proceed.  Pt does not have bp cuff.   History of Present Illness:  Pt a1c was improved recently.  Pt triglycerides were elevated recently but he was not fasting.  Pt states his upcoming appointment with urologist may be in spring/in about 4 months.  He will get flu vaccine and pneumonia vaccine on this Friday.  Pt still cutting back on cigarrettes. Pack a day now was pack and half.    Observations/Objective:  General- no acute distress. Pleasant, normal speech. Alert and oriented.  Assessment and Plan: Your a1c is much better than previous. Keep eating low sugar diet and continue metfromin. This Friday get urine microalbumin.   For high triglycerides and low hdl, recommend increase exercise and low cholesterol diet. It would be recommended to continue crestor due to risk factors and diabetes.  Encouraged to quit smoking/reduce as have in past.  Flu vaccine and pneumonia vaccine this Friday.  Follow up in 3 months or as needed  Mackie Pai, PA-C  Follow Up Instructions:    I discussed the assessment and treatment plan with the patient. The patient was provided an opportunity to ask questions and all were answered. The patient agreed with the plan and demonstrated an understanding of the  instructions.   The patient was advised to call back or seek an in-person evaluation if the symptoms worsen or if the condition fails to improve as anticipated.  I provided 15  minutes of non-face-to-face time during this encounter.   Mackie Pai, PA-C   Review of Systems  Constitutional: Negative for chills, fatigue and fever.  Respiratory: Negative for cough, chest tightness, shortness of breath and wheezing.   Cardiovascular: Negative for chest pain and palpitations.  Gastrointestinal: Negative for abdominal pain.  Genitourinary: Negative for dysuria, penile pain, penile swelling and urgency.  Musculoskeletal: Negative for back pain, gait problem, neck pain and neck stiffness.  Skin: Negative for rash.  Neurological: Negative for dizziness and headaches.  Hematological: Negative for adenopathy. Does not bruise/bleed easily.  Psychiatric/Behavioral: Negative for behavioral problems, confusion, dysphoric mood and sleep disturbance. The patient is not nervous/anxious.        Objective:   Physical Exam        Assessment & Plan:

## 2019-06-21 ENCOUNTER — Ambulatory Visit: Payer: 59

## 2019-06-21 ENCOUNTER — Other Ambulatory Visit: Payer: 59

## 2019-07-26 ENCOUNTER — Other Ambulatory Visit: Payer: Self-pay | Admitting: Medical

## 2019-09-02 ENCOUNTER — Other Ambulatory Visit: Payer: Self-pay

## 2019-09-02 ENCOUNTER — Ambulatory Visit: Payer: 59 | Admitting: Medical

## 2019-09-02 ENCOUNTER — Encounter: Payer: Self-pay | Admitting: Medical

## 2019-09-02 VITALS — BP 112/64 | HR 81 | Temp 97.4°F | Resp 18 | Wt 195.0 lb

## 2019-09-02 DIAGNOSIS — R350 Frequency of micturition: Secondary | ICD-10-CM | POA: Diagnosis not present

## 2019-09-02 DIAGNOSIS — R319 Hematuria, unspecified: Secondary | ICD-10-CM | POA: Diagnosis not present

## 2019-09-02 DIAGNOSIS — R300 Dysuria: Secondary | ICD-10-CM | POA: Diagnosis not present

## 2019-09-02 DIAGNOSIS — R1030 Lower abdominal pain, unspecified: Secondary | ICD-10-CM | POA: Diagnosis not present

## 2019-09-02 LAB — POCT URINALYSIS DIPSTICK
Bilirubin, UA: NEGATIVE
Glucose, UA: NEGATIVE
Ketones, UA: NEGATIVE
Leukocytes, UA: NEGATIVE
Nitrite, UA: NEGATIVE
Protein, UA: NEGATIVE
Spec Grav, UA: 1.025 (ref 1.010–1.025)
Urobilinogen, UA: 0.2 E.U./dL
pH, UA: 5.5 (ref 5.0–8.0)

## 2019-09-02 MED ORDER — LEVOCETIRIZINE DIHYDROCHLORIDE 5 MG PO TABS: 5.0000 mg | ORAL_TABLET | Freq: Every evening | ORAL | 3 refills | Status: DC

## 2019-09-02 MED ORDER — CIPROFLOXACIN HCL 500 MG PO TABS: 500.0000 mg | ORAL_TABLET | Freq: Two times a day (BID) | ORAL | 0 refills | Status: DC

## 2019-09-02 MED ORDER — METRONIDAZOLE 500 MG PO TABS: 500.0000 mg | ORAL_TABLET | Freq: Three times a day (TID) | ORAL | 0 refills | Status: DC

## 2019-09-02 MED FILL — metroNIDAZOLE 500 MG TABS: 500 | 7 days supply | Qty: 21 | Fill #0

## 2019-09-02 MED FILL — CIPROFLOXACIN HCL 500 MG TA: 500 | 7 days supply | Qty: 14 | Fill #0

## 2019-09-02 NOTE — Progress Notes (Signed)
Subjective:    Patient ID: Alec [name], male    DOB: [birth year], 64 y.o.   MRN: [id number]  HPI  Pt in with some lower stomach abdomen pain. Pain has been on Saturday night. He states he has some  On and of frequent bowel movement. Has known diverticulosis in past. He state had some diverticulitis in the past.  Pt wonders if he has enlarged prostate. He states he has had frequent urination since he had seed implanted for prostate CA. He has history of radioactive seed implant.   No fevers recently. But does not occasional sweating at night.     Review of Systems  Constitutional: Negative for chills, fatigue and fever.  Respiratory: Negative for cough, chest tightness, shortness of breath and wheezing.   Cardiovascular: Negative for chest pain and palpitations.  Gastrointestinal: Positive for abdominal pain. Negative for abdominal distention, constipation, diarrhea, nausea and vomiting.  Genitourinary: Positive for frequency and urgency. Negative for decreased urine volume, difficulty urinating, flank pain, hematuria and penile pain.  Musculoskeletal: Negative for back pain and neck pain.  Skin: Negative for rash.  Neurological: Negative for dizziness.  Hematological: Negative for adenopathy. Does not bruise/bleed easily.  Psychiatric/Behavioral: Negative for behavioral problems and dysphoric mood.    Past Medical History:  Diagnosis Date  . ALLERGIC RHINITIS   . Allergy   . Arthritis   . Diabetes mellitus without complication ([place])    type 2  . DIVERTICULITIS, HX OF   . GERD   . LEG PAIN, LEFT   . Prostate cancer ([place])   . SINUSITIS, CHRONIC   . SMOKER      Social History   Socioeconomic History  . Marital status: Married    Spouse name: Not on file  . Number of children: Not on file  . Years of education: Not on file  . Highest education level: Not on file  Occupational History  . Occupation: Librarian, academic   Tobacco Use  . Smoking status: Current Every  Day Smoker    Packs/day: 1.50    Years: 40.00    Pack years: 60.00  . Smokeless tobacco: Never Used  Substance and Sexual Activity  . Alcohol use: No  . Drug use: No  . Sexual activity: Yes  Other Topics Concern  . 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 Difficulty of Paying Living Expenses: Not on file  Food Insecurity:   . Worried About Charity fundraiser in the Last Year: Not on file  . Ran Out of Food in the Last Year: Not on file  Transportation Needs:   . Lack of Transportation (Medical): Not on file  . Lack of Transportation (Non-Medical): Not on file  Physical Activity:   . Days of Exercise per Week: Not on file  . Minutes of Exercise per Session: Not on file  Stress:   . Feeling of Stress : Not on file  Social Connections:   . Frequency of Communication with Friends and Family: Not on file  . Frequency of Social Gatherings with Friends and Family: Not on file  . Attends Religious Services: Not on file  . Active Member of Clubs or Organizations: Not on file  . Attends Archivist Meetings: Not on file  . Marital Status: Not on file  Intimate Partner Violence:   .  Fear of Current or Ex-Partner: Not on file  . Emotionally Abused: Not on file  . Physically Abused: Not on file  . Sexually Abused: Not on file    Past Surgical History:  Procedure Laterality Date  . FINGER SURGERY    . PROSTATE BIOPSY    . RADIOACTIVE SEED IMPLANT N/A 09/20/2018   Procedure: RADIOACTIVE SEED IMPLANT/BRACHYTHERAPY IMPLANT;  Surgeon: Irine Seal, MD;  Location: [facility];  Service: Urology;  Laterality: N/A;  . SPACE OAR INSTILLATION N/A 09/20/2018   Procedure: SPACE OAR INSTILLATION;  Surgeon: Irine Seal, MD;  Location: [facility];  Service: Urology;  Laterality: N/A;  . SPINE SURGERY      lumbar surgery 2012.    Family History  Problem Relation Age of Onset  . Alzheimer's disease Mother   . Heart disease Father   . Parkinsonism Father   . Alcohol abuse Brother   . Colon cancer Neg Hx   . Esophageal cancer Neg Hx   . Liver cancer Neg Hx   . Pancreatic cancer Neg Hx   . Rectal cancer Neg Hx   . Stomach cancer Neg Hx     No Known Allergies  Current Outpatient Medications on File Prior to Visit  Medication Sig Dispense Refill  . glucose blood (ONETOUCH VERIO) test strip Use as instructed 100 each 12  . hydrocortisone-pramoxine (PROCTOFOAM HC) rectal foam Place 1 applicator rectally 2 (two) times daily. (Patient taking differently: Place 1 applicator rectally 2 (two) times daily as needed. ) 10 g 0  . levocetirizine (XYZAL) 5 MG tablet Take 1 tablet (5 mg total) by mouth every evening. 30 tablet 3  . metFORMIN (GLUCOPHAGE) 500 MG tablet TAKE 1 TABLET BY MOUTH TWICE DAILY WITH A MEAL 180 tablet 0  . ONETOUCH DELICA LANCETS 99991111 MISC Use as directed. 100 each 12  . rosuvastatin (CRESTOR) 10 MG tablet Take 1 tablet (10 mg total) by mouth daily. 90 tablet 3  . tamsulosin (FLOMAX) 0.4 MG CAPS capsule Take 0.4 mg by mouth daily.     No current facility-administered medications on file prior to visit.    BP 112/64 (Patient Position: Sitting, Cuff Size: Large) Comment (BP Location): right arm  Pulse 81   Temp (!) 97.4 F (36.3 C) (Temporal)   Resp 18   Wt 195 lb (88.5 kg)   SpO2 97%   BMI 27.98 kg/m       Objective:   Physical Exam  General Appearance- Not in acute distress.  HEENT Eyes- Scleraeral/Conjuntiva-bilat- Not Yellow. Mouth & Throat- Normal.  Chest and Lung Exam Auscultation: Breath sounds:-Normal. Adventitious sounds:- No Adventitious sounds.  Cardiovascular Auscultation:Rythm - Regular. Heart Sounds -Normal heart sounds.  Abdomen Inspection:-Inspection Normal.  Palpation/Perucssion: Palpation and Percussion of the abdomen reveal- suprapubic   Tenderness, No Rebound tenderness, No rigidity(Guarding) and No Palpable abdominal masses.  Liver:-Normal.  Spleen:- Normal.   Back- no cva tenderness..      Assessment & Plan:  For lower abdomen pain, will get cbc and cmp today.   For frequent urination will get psa and urine culture.   Will rx flagyl and cipro. Cover diff dx of uti, prostatis and diverticultitis as that is concern you expressed.  You do have some blood in your urine and we do need to repeat urine in 2 weeks. May need to get urologist to evaluate with cystocopy. Repeat urine in 2 weeks very important.  If studies negative and pain increases or persist then ct abd/pelvis.  Follow up 2 weeks or as needed   30 minutes spent with pt.

## 2019-09-02 NOTE — Patient Instructions (Addendum)
For lower abdomen pain, will get cbc and cmp today.   For frequent urination will get psa and urine culture.   Will rx flagyl and cipro. Cover diff dx of uti, prostatis and diverticultitis as that is concern you expressed..  You do have some blood in your urine and we do need to repeat urine in 2 weeks. May need to get urologist to evaluate with cystocopy. Repeat urine in 2 weeks very important.  If studies negative and pain increases or persist then ct abd/pelvis.  Follow up 2 weeks or as needed

## 2019-09-03 ENCOUNTER — Other Ambulatory Visit (INDEPENDENT_AMBULATORY_CARE_PROVIDER_SITE_OTHER): Payer: 59

## 2019-09-03 DIAGNOSIS — D649 Anemia, unspecified: Secondary | ICD-10-CM | POA: Diagnosis not present

## 2019-09-03 LAB — CBC WITH DIFFERENTIAL/PLATELET
Basophils Absolute: 0.1 10*3/uL (ref 0.0–0.1)
Basophils Relative: 1 % (ref 0.0–3.0)
Eosinophils Absolute: 0.1 10*3/uL (ref 0.0–0.7)
Eosinophils Relative: 1.2 % (ref 0.0–5.0)
HCT: 37.5 % — ABNORMAL LOW (ref 39.0–52.0)
Hemoglobin: 11.9 g/dL — ABNORMAL LOW (ref 13.0–17.0)
Lymphocytes Relative: 34.5 % (ref 12.0–46.0)
Lymphs Abs: 2.2 10*3/uL (ref 0.7–4.0)
MCHC: 31.6 g/dL (ref 30.0–36.0)
MCV: 63.6 fl — ABNORMAL LOW (ref 78.0–100.0)
Monocytes Absolute: 0.6 10*3/uL (ref 0.1–1.0)
Monocytes Relative: 8.9 % (ref 3.0–12.0)
Neutro Abs: 3.4 10*3/uL (ref 1.4–7.7)
Neutrophils Relative %: 54.4 % (ref 43.0–77.0)
Platelets: 212 10*3/uL (ref 150.0–400.0)
RBC: 5.9 Mil/uL — ABNORMAL HIGH (ref 4.22–5.81)
RDW: 16.3 % — ABNORMAL HIGH (ref 11.5–15.5)
WBC: 6.3 10*3/uL (ref 4.0–10.5)

## 2019-09-03 LAB — COMPREHENSIVE METABOLIC PANEL
ALT: 18 U/L (ref 0–53)
AST: 16 U/L (ref 0–37)
Albumin: 4.9 g/dL (ref 3.5–5.2)
Alkaline Phosphatase: 66 U/L (ref 39–117)
BUN: 18 mg/dL (ref 6–23)
CO2: 32 mEq/L (ref 19–32)
Calcium: 9.6 mg/dL (ref 8.4–10.5)
Chloride: 101 mEq/L (ref 96–112)
Creatinine, Ser: 0.92 mg/dL (ref 0.40–1.50)
GFR: 83.02 mL/min (ref >60.00)
Glucose, Bld: 78 mg/dL (ref 70–99)
Potassium: 4.5 mEq/L (ref 3.5–5.1)
Sodium: 139 mEq/L (ref 135–145)
Total Bilirubin: 0.8 mg/dL (ref 0.2–1.2)
Total Protein: 7 g/dL (ref 6.0–8.3)

## 2019-09-03 LAB — URINE CULTURE
MICRO NUMBER:: 10173457
Result:: NO GROWTH
SPECIMEN QUALITY:: ADEQUATE

## 2019-09-03 LAB — PSA: PSA: 0.29 ng/mL (ref 0.10–4.00)

## 2019-09-03 LAB — IRON: Iron: 83 ug/dL (ref 42–165)

## 2019-09-08 ENCOUNTER — Telehealth: Payer: 59 | Admitting: Emergency Medicine

## 2019-09-08 DIAGNOSIS — H9201 Otalgia, right ear: Secondary | ICD-10-CM

## 2019-09-08 DIAGNOSIS — J069 Acute upper respiratory infection, unspecified: Secondary | ICD-10-CM

## 2019-09-08 NOTE — Progress Notes (Signed)
Based on what you shared with me, I feel your condition warrants further evaluation and I recommend that you be seen for a face to face visit.  Please contact your primary care physician practice to be seen. Many offices offer virtual options to be seen via video if you are not comfortable going in person to a medical facility at this time.  If you do not have a PCP, [facility] offers a free physician referral service available at [phone]. Our trained staff has the experience, knowledge and resources to put you in touch with a physician who is right for you.   You also have the option of a video visit through [email or web address]  If you are having a true medical emergency please call 911.  NOTE: If you entered your credit card information for this eVisit, you will not be charged. You may see a "hold" on your card for the $35 but that hold will drop off and you will not have a charge processed.  Your e-visit answers were reviewed by a board certified advanced clinical practitioner to complete your personal care plan.  Thank you for using e-Visits.   Greater than 5 but less than 10 minutes spent researching, coordinating, and implementing care for this patient today

## 2019-09-16 ENCOUNTER — Other Ambulatory Visit: Payer: Self-pay

## 2019-09-16 ENCOUNTER — Ambulatory Visit: Payer: 59 | Admitting: Medical

## 2019-09-16 VITALS — BP 112/70 | HR 86 | Temp 97.7°F | Resp 18 | Ht 71.0 in | Wt 197.6 lb

## 2019-09-16 DIAGNOSIS — R0981 Nasal congestion: Secondary | ICD-10-CM

## 2019-09-16 DIAGNOSIS — Z122 Encounter for screening for malignant neoplasm of respiratory organs: Secondary | ICD-10-CM | POA: Diagnosis not present

## 2019-09-16 DIAGNOSIS — R319 Hematuria, unspecified: Secondary | ICD-10-CM

## 2019-09-16 DIAGNOSIS — R739 Hyperglycemia, unspecified: Secondary | ICD-10-CM | POA: Diagnosis not present

## 2019-09-16 DIAGNOSIS — E781 Pure hyperglyceridemia: Secondary | ICD-10-CM | POA: Diagnosis not present

## 2019-09-16 LAB — POC URINALSYSI DIPSTICK (AUTOMATED)
Bilirubin, UA: NEGATIVE
Blood, UA: NEGATIVE
Glucose, UA: NEGATIVE
Ketones, UA: NEGATIVE
Leukocytes, UA: NEGATIVE
Nitrite, UA: NEGATIVE
Protein, UA: NEGATIVE
Spec Grav, UA: 1.015 (ref 1.010–1.025)
Urobilinogen, UA: 0.2 E.U./dL
pH, UA: 5.5 (ref 5.0–8.0)

## 2019-09-16 LAB — LIPID PANEL
Cholesterol: 107 mg/dL (ref 0–200)
HDL: 33.4 mg/dL — ABNORMAL LOW (ref >39.00)
LDL Cholesterol: 55 mg/dL (ref 0–99)
NonHDL: 73.79
Total CHOL/HDL Ratio: 3
Triglycerides: 95 mg/dL (ref 0.0–149.0)
VLDL: 19 mg/dL (ref 0.0–40.0)

## 2019-09-16 LAB — HEMOGLOBIN A1C: Hgb A1c MFr Bld: 6.6 % — ABNORMAL HIGH (ref 4.6–6.5)

## 2019-09-16 MED ORDER — AZELASTINE HCL 0.1 % NA SOLN: 2.0000 | Freq: Two times a day (BID) | NASAL | 2 refills | Status: DC

## 2019-09-16 MED FILL — AZELASTINE HCL 137 MCG SPRY: 0.1 | 30 days supply | Qty: 30 | Fill #0

## 2019-09-16 NOTE — Patient Instructions (Addendum)
Your urine cleared from blood today. When you see your urologist they will likely repeat urine.  For hx of prostates CA follow up with urologist this month.   Resolved abdomen pain with antibiotics. I do think you likely had diverticulitis. Won't order ct since clinically improved.  For elevated sugar and high triglycerides will get a1c and lipid panel today.  Hx of smoking and nasal congestion. Try astelin for vasomotor rhinitis.  Put in ct chest for screening lung ca.  Follow up 3 months or as needed

## 2019-09-16 NOTE — Progress Notes (Signed)
Subjective:    Patient ID: Alec [name], male    DOB: 27-Feb-1956, 64 y.o.   MRN: [id number]  HPI  Pt in for follow up. Last visit hpi below.   HPI  "Pt in with some lower stomach abdomen pain. Pain has been on Saturday night. He states he has some  On and of frequent bowel movement. Has known diverticulosis in past. He state had some diverticulitis in the past.  Pt wonders if he has enlarged prostate. He states he has had frequent urination since he had seed implanted for prostate CA. He has history of radioactive seed implant. "  A/P below for last visit.  For lower abdomen pain, will get cbc and cmp today.   For frequent urination will get psa and urine culture.   Will rx flagyl and cipro. Cover diff dx of uti, prostatis and diverticultitis as that is concern you expressed.  You do have some blood in your urine and we do need to repeat urine in 2 weeks. May need to get urologist to evaluate with cystocopy. Repeat urine in 2 weeks very important.  If studies negative and pain increases or persist then ct abd/pelvis.  Pt psa was 0.29. Mild borderline anemia but iron level normal. Kidney function looked normal. Urine culture showed no growth.  Repeating urine due to trace blood on last ua and he is smoker.    Today reports he feel completely better. Started to feel better with combination of cipro and flagyl. Pt has no urinary symptoms. Resolved abdomen pain. Has upcoming appointment with urologist. He will see urologist this month.  Pt has diabetes well controlled. Needs a1c. Hx of high cholesterol in past. Elevated triglyercides.    Review of Systems  Constitutional: Negative for chills, fatigue and fever.  Respiratory: Negative for cough, chest tightness, shortness of breath and wheezing.   Cardiovascular: Negative for chest pain and palpitations.  Gastrointestinal: Negative for abdominal pain, diarrhea, nausea and vomiting.  Musculoskeletal:  Negative for back pain and neck pain.  Skin: Negative for rash.  Neurological: Negative for dizziness, syncope, speech difficulty, weakness and light-headedness.  Hematological: Negative for adenopathy. Does not bruise/bleed easily.  Psychiatric/Behavioral: Negative for behavioral problems, confusion, dysphoric mood and hallucinations.    Past Medical History:  Diagnosis Date  . ALLERGIC RHINITIS   . Allergy   . Arthritis   . Diabetes mellitus without complication ([place])    type 2  . DIVERTICULITIS, HX OF   . GERD   . LEG PAIN, LEFT   . Prostate cancer ([place])   . SINUSITIS, CHRONIC   . SMOKER      Social History   Socioeconomic History  . Marital status: Married    Spouse name: Not on file  . Number of children: Not on file  . Years of education: Not on file  . Highest education level: Not on file  Occupational History  . Occupation: Librarian, academic   Tobacco Use  . Smoking status: Current Every Day Smoker    Packs/day: 1.50    Years: 40.00    Pack years: 60.00  . Smokeless tobacco: Never Used  Substance and Sexual Activity  . Alcohol use: No  . Drug use: No  . Sexual activity: Yes  Other Topics Concern  . 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  Difficulty of Paying Living Expenses: Not on file  Food Insecurity:   . Worried About Charity fundraiser in the Last Year: Not on file  . Ran Out of Food in the Last Year: Not on file  Transportation Needs:   . Lack of Transportation (Medical): Not on file  . Lack of Transportation (Non-Medical): Not on file  Physical Activity:   . Days of Exercise per Week: Not on file  . Minutes of Exercise per Session: Not on file  Stress:   . Feeling of Stress : Not on file  Social Connections:   . Frequency of Communication with Friends and Family: Not on file  . Frequency of  Social Gatherings with Friends and Family: Not on file  . Attends Religious Services: Not on file  . Active Member of Clubs or Organizations: Not on file  . Attends Archivist Meetings: Not on file  . Marital Status: Not on file  Intimate Partner Violence:   . Fear of Current or Ex-Partner: Not on file  . Emotionally Abused: Not on file  . Physically Abused: Not on file  . Sexually Abused: Not on file    Past Surgical History:  Procedure Laterality Date  . FINGER SURGERY    . PROSTATE BIOPSY    . RADIOACTIVE SEED IMPLANT N/A 09/20/2018   Procedure: RADIOACTIVE SEED IMPLANT/BRACHYTHERAPY IMPLANT;  Surgeon: Irine Seal, MD;  Location: [facility];  Service: Urology;  Laterality: N/A;  . SPACE OAR INSTILLATION N/A 09/20/2018   Procedure: SPACE OAR INSTILLATION;  Surgeon: Irine Seal, MD;  Location: [facility];  Service: Urology;  Laterality: N/A;  . SPINE SURGERY     lumbar surgery 2012.    Family History  Problem Relation Age of Onset  . Alzheimer's disease Mother   . Heart disease Father   . Parkinsonism Father   . Alcohol abuse Brother   . Colon cancer Neg Hx   . Esophageal cancer Neg Hx   . Liver cancer Neg Hx   . Pancreatic cancer Neg Hx   . Rectal cancer Neg Hx   . Stomach cancer Neg Hx     No Known Allergies  Current Outpatient Medications on File Prior to Visit  Medication Sig Dispense Refill  . ciprofloxacin (CIPRO) 500 MG tablet Take 1 tablet (500 mg total) by mouth 2 (two) times daily. 14 tablet 0  . glucose blood (ONETOUCH VERIO) test strip Use as instructed 100 each 12  . hydrocortisone-pramoxine (PROCTOFOAM HC) rectal foam Place 1 applicator rectally 2 (two) times daily. (Patient taking differently: Place 1 applicator rectally 2 (two) times daily as needed. ) 10 g 0  . levocetirizine (XYZAL) 5 MG tablet Take 1 tablet (5 mg total) by mouth every evening. 30 tablet 3  . metFORMIN (GLUCOPHAGE) 500 MG tablet TAKE 1 TABLET BY  MOUTH TWICE DAILY WITH A MEAL 180 tablet 0  . metroNIDAZOLE (FLAGYL) 500 MG tablet Take 1 tablet (500 mg total) by mouth 3 (three) times daily. 21 tablet 0  . ONETOUCH DELICA LANCETS 99991111 MISC Use as directed. 100 each 12  . rosuvastatin (CRESTOR) 10 MG tablet Take 1 tablet (10 mg total) by mouth daily. 90 tablet 3  . tamsulosin (FLOMAX) 0.4 MG CAPS capsule Take 0.4 mg by mouth daily.     No current facility-administered medications on file prior to visit.    BP 112/70 (BP Location: Left Arm, Patient Position: Sitting, Cuff Size: Large)   Pulse 86  Temp 97.7 F (36.5 C) (Temporal)   Resp 18   Ht 5\' 11"  (1.803 m)   Wt 197 lb 9.6 oz (89.6 kg)   SpO2 99%   BMI 27.56 kg/m       Objective:   Physical Exam  General Mental Status- Alert. General Appearance- Not in acute distress.   Skin General: Color- Normal Color. Moisture- Normal Moisture.  Neck Carotid Arteries- Normal color. Moisture- Normal Moisture. No carotid bruits. No JVD.  Chest and Lung Exam Auscultation: Breath Sounds:-Normal.  Cardiovascular Auscultation:Rythm- Regular. Murmurs & Other Heart Sounds:Auscultation of the heart reveals- No Murmurs.  Abdomen Inspection:-Inspeection Normal. Palpation/Percussion:Note:No mass. Palpation and Percussion of the abdomen reveal- Non Tender, Non Distended + BS, no rebound or guarding.  Neurologic Cranial Nerve exam:- CN III-XII intact(No nystagmus), symmetric smile. Strength:- 5/5 equal and symmetric strength both upper and lower extremities.      Assessment & Plan:  Your urine cleared from blood today. When you see your urologist they will likely repeat urine.  For hx of prostates CA follow up with urologist this month.   Resolved abdomen pain with antibiotics. I do think you likely had diverticulitis. Won't order ct since clinically improved.  For elevated sugar and high triglycerides will get a1c and lipid panel today.  Hx of smoking and nasal congestion. Try  astelin for vasomotor rhinitis.  Put in ct chest for screening lung ca.  30 minutes spent with pt today.  Follow up 3 months or as needed  [employer], [employer]

## 2019-09-18 ENCOUNTER — Other Ambulatory Visit: Payer: Self-pay | Admitting: Medical

## 2019-09-20 ENCOUNTER — Other Ambulatory Visit: Payer: Self-pay

## 2019-09-20 ENCOUNTER — Ambulatory Visit (HOSPITAL_BASED_OUTPATIENT_CLINIC_OR_DEPARTMENT_OTHER)
Admission: RE | Admit: 2019-09-20 | Discharge: 2019-09-20 | Disposition: A | Discharge status: Home / Self Care | Payer: 59 | Source: Ambulatory Visit | Attending: Medical | Admitting: Medical

## 2019-09-20 ENCOUNTER — Telehealth: Payer: Self-pay | Admitting: Medical

## 2019-09-20 DIAGNOSIS — J449 Chronic obstructive pulmonary disease, unspecified: Secondary | ICD-10-CM

## 2019-09-20 DIAGNOSIS — Z122 Encounter for screening for malignant neoplasm of respiratory organs: Secondary | ICD-10-CM | POA: Diagnosis present

## 2019-09-20 DIAGNOSIS — I2510 Atherosclerotic heart disease of native coronary artery without angina pectoris: Secondary | ICD-10-CM

## 2019-09-20 NOTE — Telephone Encounter (Signed)
Referral to cardio and pulmonologist placed.

## 2019-09-20 NOTE — Telephone Encounter (Signed)
Referral to cardiologist and pulmonologist placed.

## 2019-09-30 ENCOUNTER — Encounter: Payer: Self-pay | Admitting: Internal Medicine

## 2019-09-30 ENCOUNTER — Ambulatory Visit: Payer: 59 | Admitting: Internal Medicine

## 2019-09-30 ENCOUNTER — Other Ambulatory Visit: Payer: Self-pay

## 2019-09-30 VITALS — BP 120/65 | HR 82 | Temp 98.1°F | Ht 70.0 in | Wt 192.4 lb

## 2019-09-30 DIAGNOSIS — F17210 Nicotine dependence, cigarettes, uncomplicated: Secondary | ICD-10-CM | POA: Diagnosis not present

## 2019-09-30 DIAGNOSIS — J432 Centrilobular emphysema: Secondary | ICD-10-CM | POA: Diagnosis not present

## 2019-09-30 DIAGNOSIS — Z716 Tobacco abuse counseling: Secondary | ICD-10-CM

## 2019-09-30 NOTE — Patient Instructions (Addendum)
The patient should have follow up scheduled with myself in 6 months.   What are the benefits of quitting smoking? Quitting smoking can lower your chances of getting or dying from heart disease, lung disease, kidney failure, infection, or cancer. It can also lower your chances of getting osteoporosis, a condition that makes your bones weak. Plus, quitting smoking can help your skin look younger and reduce the chances that you will have problems with sex.  Quitting smoking will improve your health no matter how old you are, and no matter how long or how much you have smoked.  What should I do if I want to quit smoking? The letters in the word "START" can help you remember the steps to take: S = Set a quit date. T = Tell family, friends, and the people around you that you plan to quit. A = Anticipate or plan ahead for the tough times you'll face while quitting. R = Remove cigarettes and other tobacco products from your home, car, and work. T = Talk to your doctor about getting help to quit.  How can my doctor or nurse help? Your doctor or nurse can give you advice on the best way to quit. He or she can also put you in touch with counselors or other people you can call for support. Plus, your doctor or nurse can give you medicines to: ?Reduce your craving for cigarettes ?Reduce the unpleasant symptoms that happen when you stop smoking (called "withdrawal symptoms"). You can also get help from a free phone line (1-800-QUIT-NOW) or go online to [facility].  What are the symptoms of withdrawal? The symptoms include: ?Trouble sleeping ?Being irritable, anxious or restless ?Getting frustrated or angry ?Having trouble thinking clearly  Some people who stop smoking become temporarily depressed. Some people need treatment for depression, such as counseling or antidepressant medicines. Depressed people might: ?No longer enjoy or care about doing the things they used to like to do ?Feel sad,  down, hopeless, nervous, or cranky most of the day, almost every day ?Lose or gain weight ?Sleep too much or too little ?Feel tired or like they have no energy ?Feel guilty or like they are worth nothing ?Forget things or feel confused ?Move and speak more slowly than usual ?Act restless or have trouble staying still ?Think about death or suicide  If you think you might be depressed, see your doctor or nurse. Only someone trained in mental health can tell for sure if you are depressed. If you ever feel like you might hurt yourself, go straight to the nearest emergency department. Or you can call for an ambulance (in the [place] and [place], [place] 9-1-1) or call your doctor or nurse right away and tell them it is an emergency. You can also reach the [place] National Suicide Prevention Lifeline at (986)852-5249 or http://walker-sanchez.info/.  How do medicines help you stop smoking? Different medicines work in different ways: ?Nicotine replacement therapy eases withdrawal and reduces your body's craving for nicotine, the main drug found in cigarettes. There are different forms of nicotine replacement, including skin patches, lozenges, gum, nasal sprays, and "puffers" or inhalers. Many can be bought without a prescription, while others might require one. ?Bupropion is a prescription medicine that reduces your desire to smoke. This medicine is sold under the brand names Zyban and Wellbutrin. It is also available in a generic version, which is cheaper than brand name medicines. ?Varenicline (brand names: Chantix, Champix) is a prescription medicine that reduces withdrawal symptoms and cigarette  cravings. If you think you'd like to take varenicline and you have a history of depression, anxiety, or heart disease, discuss this with your doctor or nurse before taking the medicine. Varenicline can also increase the effects of alcohol in some people. It's a good idea to limit drinking while you're taking it, at  least until you know how it affects you.  How does counseling work? Counseling can happen during formal office visits or just over the phone. A counselor can help you: ?Figure out what triggers your smoking and what to do instead ?Overcome cravings ?Figure out what went wrong when you tried to quit before  What works best? Studies show that people have the best luck at quitting if they take medicines to help them quit and work with a Social worker. It might also be helpful to combine nicotine replacement with one of the prescription medicines that help people quit. In some cases, it might even make sense to take bupropion and varenicline together.  What about e-cigarettes? Sometimes people wonder if using electronic cigarettes, or "e-cigarettes," might help them quit smoking. Using e-cigarettes is also called "vaping." Doctors do not recommend e-cigarettes in place of medicines and counseling. That's because e-cigarettes still contain nicotine as well as other substances that might be harmful. It's not clear how they can affect a person's health in the long term.  Will I gain weight if I quit? Yes, you might gain a few pounds. But quitting smoking will have a much more positive effect on your health than weighing a few pounds more. Plus, you can help prevent some weight gain by being more active and eating less. Taking the medicine bupropion might help control weight gain.   What else can I do to improve my chances of quitting? You can: ?Start exercising. ?Stay away from smokers and places that you associate with smoking. If people close to you smoke, ask them to quit with you. ?Keep gum, hard candy, or something to put in your mouth handy. If you get a craving for a cigarette, try one of these instead. ?Don't give up, even if you start smoking again. It takes most people a few tries before they succeed.  What if I am pregnant and I smoke? If you are pregnant, it's really important for the  health of your baby that you quit. Ask your doctor what options you have, and what is safest for your baby

## 2019-09-30 NOTE — Progress Notes (Signed)
Alec [name]    [id number]    06-09-1956  Primary Care Physician:Saguier, Iris Pert  Referring Physician: Mackie Pai, PA-C [address] [place],  [place] [address] Reason for Consultation: COPD Date of Consultation: 09/30/2019  Chief complaint:   Chief Complaint  Patient presents with  . Consult    COPD.  cough with clear phlem.  chest congestion     HPI:  Has no limitations with his daily activities, but exertion does make him short of breath. He does have cough with productive sputum. Occasional wheezing. Last week has cut back from 30 cigarettes down to 15 a day.   Treated with abx for bronchitis less than once/year. Last time 2-3 years ago.   Does not take any inhalers.   Still works full time. Does not feel that he has any limitations.   Sent as referral from PCP for COPD.    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  Exposures: Lives at home with his wife (who has Multiple myeloma.) Smoking history: 45 years x 1.5 ppd = 60 pack years  Social History   Occupational History  . Occupation: Librarian, academic   Tobacco Use  . Smoking status: Current Every Day Smoker    Packs/day: 1.50    Years: 40.00    Pack years: 60.00  . Smokeless tobacco: Never Used  . Tobacco comment: trying to quit, from 1 1/2 ppd to 13-14/day  Substance and Sexual Activity  . Alcohol use: No  . Drug use: No  . Sexual activity: Yes    Relevant family history: Family History  Problem Relation Age of Onset  . Alzheimer's disease Mother   . Heart disease Father   . Parkinsonism Father   . Alcohol abuse Brother   . Colon cancer Neg Hx   . Esophageal cancer Neg Hx   . Liver cancer Neg Hx   . Pancreatic cancer Neg Hx   . Rectal cancer Neg Hx   . Stomach cancer Neg Hx     Past Medical History:  Diagnosis Date  . ALLERGIC RHINITIS   . Allergy   . Arthritis   . Diabetes mellitus without complication ([facility] [facility])    type  2  . DIVERTICULITIS, HX OF   . GERD   . LEG PAIN, LEFT   . Prostate cancer ([place])   . SINUSITIS, CHRONIC   . SMOKER     Past Surgical History:  Procedure Laterality Date  . FINGER SURGERY    . PROSTATE BIOPSY    . RADIOACTIVE SEED IMPLANT N/A 09/20/2018   Procedure: RADIOACTIVE SEED IMPLANT/BRACHYTHERAPY IMPLANT;  Surgeon: Irine Seal, MD;  [facility];  Service: Urology;  Laterality: N/A;  . SPACE OAR INSTILLATION N/A 09/20/2018   Procedure: SPACE OAR INSTILLATION;  Surgeon: Irine Seal, MD;  Location: [facility];  Service: Urology;  Laterality: N/A;  . SPINE SURGERY     lumbar surgery 2012.     Review of systems: Review of Systems  Constitutional: Negative for chills, fever and weight loss.  HENT: Negative for congestion, sinus pain and sore throat.   Eyes: Negative for discharge and redness.  Respiratory: Positive for cough and shortness of breath. Negative for hemoptysis, sputum production and wheezing.   Cardiovascular: Negative for chest pain, palpitations and leg swelling.  Gastrointestinal: Negative for heartburn, nausea and vomiting.  Musculoskeletal: Negative for joint pain and myalgias.  Skin: Negative  for rash.  Neurological: Negative for dizziness, tremors, focal weakness and headaches.  Endo/Heme/Allergies: Negative for environmental allergies.  Psychiatric/Behavioral: Negative for depression. The patient is not nervous/anxious.   All other systems reviewed and are negative.   Physical Exam: Blood pressure 120/65, pulse 82, temperature 98.1 F (36.7 C), temperature source Temporal, height _0  (1.778 m), weight 192 lb 6.4 oz (87.3 kg), SpO2 97 %. Gen:      No acute distress Lungs:    No increased respiratory effort, symmetric chest wall excursion, clear to auscultation bilaterally, no wheezes or crackles CV:         Regular rate and rhythm; no murmurs, rubs, or gallops.  No pedal edema MSK: no acute synovitis of DIP or  PIP joints, no mechanics hands.  Skin:      Warm and dry; no rashes Neuro: normal speech, no focal facial asymmetry Psych: alert and oriented x3, normal mood and affect   Data Reviewed/Medical Decision Making:  Independent interpretation of tests: Imaging: . Review of patient's CT Chest images revealed moderate centrilobular emphysema. [name] images have been independently reviewed by me.    PFTs: None on file  Labs:  Lab Results  Component Value Date   WBC 6.3 09/02/2019   HGB 11.9 (L) 09/02/2019   HCT 37.5 (L) 09/02/2019   MCV 63.6 Repeated and verified X2. (L) 09/02/2019   PLT 212.0 09/02/2019     Immunization status:  Immunization History  Administered Date(s) Administered  . Influenza-Unspecified 10/06/2017  . Td 07/11/2001  . Tdap 06/02/2014    . I reviewed prior external note(s) from [employer] . I reviewed the result(s) of the labs and imaging as noted above.   Assessment:  Centrilobular Emphysema Tobacco Use Disorder  Plan/Recommendations: Mild symptoms. Would hold off on inhaler therapy at this time as he is relatively free of symptoms Continue NRT for smoking cessation.   I personally spent 11 minutes counseling the patient regarding tobacco use disorder.  Patient is symptomatic from tobacco use disorder due to the following condition: emphysema.  [name] response was contemplative.  We discussed nicotine replacement therapy, Wellbutrin, Chantix.  We identified to gather patient specific barriers to change.  The patient is open to future discussions about tobacco cessation. Would like to try NRT for now. We discussed vaping  We discussed disease management and progression at length today.    Return to Care: Return in about 6 months (around 04/01/2020).  Lenice Llamas, MD Pulmonary and [facility]  CC: Saguier, Percell Miller, [place]

## 2019-10-21 ENCOUNTER — Telehealth: Payer: Self-pay | Admitting: Medical

## 2019-10-21 MED ORDER — ROSUVASTATIN CALCIUM 10 MG PO TABS: 10.0000 mg | ORAL_TABLET | Freq: Every day | ORAL | 3 refills | Status: DC

## 2019-10-21 NOTE — Telephone Encounter (Signed)
Medication sent.

## 2019-10-21 NOTE — Telephone Encounter (Signed)
Medication: rosuvastatin (CRESTOR) 10 MG tablet    Has the patient contacted their pharmacy? No. (If no, request that the patient contact the pharmacy for the refill.) (If yes, when and what did the pharmacy advise?)  Preferred Pharmacy (with phone number or street name): [facility] [address] [place], [place] [address]  Phone:  [phone] Fax:  [phone]  DEA #:  --  Agent: Please be advised that RX refills may take up to 3 business days. We ask that you follow-up with your pharmacy.

## 2019-10-25 ENCOUNTER — Telehealth: Payer: Self-pay

## 2019-10-25 ENCOUNTER — Other Ambulatory Visit: Payer: Self-pay

## 2019-10-25 ENCOUNTER — Ambulatory Visit: Payer: 59 | Admitting: Cardiovascular Disease

## 2019-10-25 ENCOUNTER — Encounter: Payer: Self-pay | Admitting: Cardiovascular Disease

## 2019-10-25 VITALS — BP 122/88 | HR 80 | Temp 98.9°F | Ht 70.0 in | Wt 192.0 lb

## 2019-10-25 DIAGNOSIS — Z720 Tobacco use: Secondary | ICD-10-CM | POA: Diagnosis not present

## 2019-10-25 DIAGNOSIS — R079 Chest pain, unspecified: Secondary | ICD-10-CM

## 2019-10-25 DIAGNOSIS — I2583 Coronary atherosclerosis due to lipid rich plaque: Secondary | ICD-10-CM

## 2019-10-25 DIAGNOSIS — I2510 Atherosclerotic heart disease of native coronary artery without angina pectoris: Secondary | ICD-10-CM

## 2019-10-25 DIAGNOSIS — J449 Chronic obstructive pulmonary disease, unspecified: Secondary | ICD-10-CM

## 2019-10-25 DIAGNOSIS — Z01818 Encounter for other preprocedural examination: Secondary | ICD-10-CM

## 2019-10-25 DIAGNOSIS — E119 Type 2 diabetes mellitus without complications: Secondary | ICD-10-CM

## 2019-10-25 MED ORDER — METOPROLOL TARTRATE 100 MG PO TABS: ORAL_TABLET | ORAL | 0 refills | Status: DC

## 2019-10-25 NOTE — Patient Instructions (Signed)
Medication Instructions:  Take Lopressor 100 mg 2 hours before cardiac ct   *If you need a refill on your cardiac medications before your next appointment, please call your pharmacy*   Lab Work: BMET due 1 week before CT   If you have labs (blood work) drawn today and your tests are completely normal, you will receive your results only by: Marland Kitchen MyChart Message (if you have MyChart) OR . A paper copy in the mail If you have any lab test that is abnormal or we need to change your treatment, we will call you to review the results.   Testing/Procedures: Cardiac CT, see attached instructions    Follow-Up: At [facility], you and your health needs are our priority.  As part of our continuing mission to provide you with exceptional heart care, we have created designated Provider Care Teams.  These Care Teams include your primary Cardiologist (physician) and Advanced Practice Providers (APPs -  Physician Assistants and Nurse Practitioners) who all work together to provide you with the care you need, when you need it.  We recommend signing up for the patient portal called "MyChart".  Sign up information is provided on this After Visit Summary.  MyChart is used to connect with patients for Virtual Visits (Telemedicine).  Patients are able to view lab/test results, encounter notes, upcoming appointments, etc.  Non-urgent messages can be sent to your provider as well.   To learn more about what you can do with MyChart, go to [email or web address].    Your next appointment:   3 month(s)  The format for your next appointment:   Virtual Visit   Provider:   Kate Sable, MD   Other Instructions None       Thank you for choosing [facility] !

## 2019-10-25 NOTE — Progress Notes (Signed)
CARDIOLOGY CONSULT NOTE  Patient ID: Le Clougherty MRN: [id number] DOB/AGE: [birth year] 64 y.o.  Admit date: (Not on file) Primary Physician: Mackie Pai, PA-C  Reason for Consultation: Coronary artery calcifications  HPI: Alec [name] is a 64 y.o. male who is being seen today for the evaluation of coronary artery calcifications at the request of Saguier, Percell Miller, [place].   Past medical history includes tobacco abuse, prostate cancer, type 2 diabetes mellitus, and COPD.  He has been smoking 1.5 packs of cigarettes daily for roughly 45 years.  He is now down to 10 to 13 cigarettes daily and wants to quit.  He underwent a chest CT for lung cancer screening on 09/20/2019 which I reviewed which demonstrated aortic atherosclerosis and atherosclerosis of the great vessels of the mediastinum and the coronary arteries, including calcified atherosclerotic plaque in the left anterior descending, left circumflex and right coronary arteries.  He denies chest pain, leg swelling, orthopnea, paroxysmal nocturnal dyspnea, and palpitations.  He thinks his breathing has gradually gotten a little worse over the past 5 years.  ECG performed in the office today which I ordered and personally interpreted demonstrates normal sinus rhythm with no ischemic ST segment or T-wave abnormalities, nor any arrhythmias.  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   Social history: His wife, Olegario Shearer, works at [employer] in [place].  No Known Allergies  Current Outpatient Medications  Medication Sig Dispense Refill  . azelastine (ASTELIN) 0.1 % nasal spray Place 2 sprays into both nostrils 2 (two) times daily. Use in each nostril as directed 30 mL 2  . glucose blood (ONETOUCH VERIO) test strip Use as instructed 100 each 12  . hydrocortisone-pramoxine (PROCTOFOAM HC) rectal foam Place 1 applicator rectally 2 (two) times daily. (Patient taking differently:  Place 1 applicator rectally 2 (two) times daily as needed. ) 10 g 0  . levocetirizine (XYZAL) 5 MG tablet Take 1 tablet (5 mg total) by mouth every evening. 30 tablet 3  . metFORMIN (GLUCOPHAGE) 500 MG tablet TAKE 1 TABLET BY MOUTH TWICE DAILY WITH A MEAL 180 tablet 0  . metroNIDAZOLE (FLAGYL) 500 MG tablet Take 1 tablet (500 mg total) by mouth 3 (three) times daily. 21 tablet 0  . ONETOUCH DELICA LANCETS 99991111 MISC Use as directed. 100 each 12  . rosuvastatin (CRESTOR) 10 MG tablet Take 1 tablet (10 mg total) by mouth daily. 90 tablet 3  . tamsulosin (FLOMAX) 0.4 MG CAPS capsule Take 0.4 mg by mouth daily.     No current facility-administered medications for this visit.    Past Medical History:  Diagnosis Date  . ALLERGIC RHINITIS   . Allergy   . Arthritis   . Diabetes mellitus without complication ([facility])    type 2  . DIVERTICULITIS, HX OF   . GERD   . LEG PAIN, LEFT   . Prostate cancer ([place])   . SINUSITIS, CHRONIC   . SMOKER     Past Surgical History:  Procedure Laterality Date  . FINGER SURGERY    . PROSTATE BIOPSY    . RADIOACTIVE SEED IMPLANT N/A 09/20/2018   Procedure: RADIOACTIVE SEED IMPLANT/BRACHYTHERAPY IMPLANT;  Surgeon: Irine Seal, MD;  Location[facility];  Service: Urology;  Laterality: N/A;  . SPACE OAR INSTILLATION N/A 09/20/2018   Procedure: SPACE OAR INSTILLATION;  Surgeon: Irine Seal, MD;  Location[facility];  Service: Urology;  Laterality: N/A;  . SPINE  SURGERY     lumbar surgery 2012.    Social History   Socioeconomic History  . Marital status: Married    Spouse name: Not on file  . Number of children: Not on file  . Years of education: Not on file  . Highest education level: Not on file  Occupational History  . Occupation: Librarian, academic   Tobacco Use  . Smoking status: Current Every Day Smoker    Packs/day: 0.75    Years: 40.00    Pack years: 30.00  . Smokeless tobacco: Never Used  . Tobacco comment: trying to  quit, from 1 1/2 ppd to 13-14/day  Substance and Sexual Activity  . Alcohol use: No  . Drug use: No  . Sexual activity: Yes  Other Topics Concern  . 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 Difficulty of Paying Living Expenses:   Food Insecurity:   . Worried About Charity fundraiser in the Last Year:   . Arboriculturist in the Last Year:   Transportation Needs:   . Film/video editor (Medical):   Marland Kitchen Lack of Transportation (Non-Medical):   Physical Activity:   . Days of Exercise per Week:   . Minutes of Exercise per Session:   Stress:   . Feeling of Stress :   Social Connections:   . Frequency of Communication with Friends and Family:   . Frequency of Social Gatherings with Friends and Family:   . Attends Religious Services:   . Active Member of Clubs or Organizations:   . Attends Archivist Meetings:   Marland Kitchen Marital Status:   Intimate Partner Violence:   . Fear of Current or Ex-Partner:   . Emotionally Abused:   Marland Kitchen Physically Abused:   . Sexually Abused:      No family history of premature CAD in 1st degree relatives.  Current Meds  Medication Sig  . azelastine (ASTELIN) 0.1 % nasal spray Place 2 sprays into both nostrils 2 (two) times daily. Use in each nostril as directed  . glucose blood (ONETOUCH VERIO) test strip Use as instructed  . hydrocortisone-pramoxine (PROCTOFOAM HC) rectal foam Place 1 applicator rectally 2 (two) times daily. (Patient taking differently: Place 1 applicator rectally 2 (two) times daily as needed. )  . levocetirizine (XYZAL) 5 MG tablet Take 1 tablet (5 mg total) by mouth every evening.  . metFORMIN (GLUCOPHAGE) 500 MG tablet TAKE 1 TABLET BY MOUTH TWICE DAILY WITH A MEAL  . metroNIDAZOLE (FLAGYL) 500 MG tablet Take 1 tablet (500 mg total) by mouth 3 (three) times daily.  Glory Rosebush  DELICA LANCETS 99991111 MISC Use as directed.  . rosuvastatin (CRESTOR) 10 MG tablet Take 1 tablet (10 mg total) by mouth daily.  . tamsulosin (FLOMAX) 0.4 MG CAPS capsule Take 0.4 mg by mouth daily.      Review of systems complete and found to be negative unless listed above in HPI    Physical exam Blood pressure 122/88, pulse 80, temperature 98.9 F (37.2 C), height 5\' 10"  (1.778 m), SpO2 98 %. General: NAD Neck: No JVD, no thyromegaly or thyroid nodule.  Lungs: Clear to auscultation bilaterally with normal respiratory effort. CV: Nondisplaced PMI. Regular rate and rhythm, normal S1/S2, no S3/S4, no murmur.  No peripheral edema.  No carotid bruit.    Abdomen:  Soft, nontender, no distention.  Skin: Intact without lesions or rashes.  Neurologic: Alert and oriented x 3.  Psych: Normal affect. Extremities: No clubbing or cyanosis.  HEENT: Normal.   ECG: I personally reviewed the ECG performed on 07/31/2018 which demonstrated sinus rhythm with no ischemic ST segment or T wave abnormalities, nor any arrhythmias.   Labs: Lab Results  Component Value Date/Time   K 4.5 09/02/2019 02:47 PM   BUN 18 09/02/2019 02:47 PM   CREATININE 0.92 09/02/2019 02:47 PM   CREATININE 1.05 06/14/2019 02:51 PM   ALT 18 09/02/2019 02:47 PM   TSH 0.79 07/13/2016 11:32 AM   HGB 11.9 (L) 09/02/2019 02:47 PM     Lipids: Lab Results  Component Value Date/Time   LDLCALC 55 09/16/2019 09:02 AM   LDLCALC 50 06/14/2019 02:51 PM   CHOL 107 09/16/2019 09:02 AM   TRIG 95.0 09/16/2019 09:02 AM   HDL 33.40 (L) 09/16/2019 09:02 AM        ASSESSMENT AND PLAN:   1.  Coronary artery calcifications/dyspnea on exertion: He denies a history of anginal pain.  He has had some gradually increasing shortness of breath over the past 5 years which may be due to worsening COPD but ischemic heart disease cannot entirely be excluded.  Cardiovascular risk factors include tobacco abuse and type 2 diabetes mellitus.  I will  proceed with coronary CT angiography to assess for obstructive coronary artery disease.  He is on rosuvastatin.  2.  Tobacco abuse: He has been smoking 1.5 packs of cigarettes daily for roughly 45 years.  He is down to 10 to 13 cigarettes daily and wants to quit.  3.  COPD: Stable.  4.  Type 2 diabetes mellitus: Currently on Metformin.     Disposition: Follow up in 3 months virtual visit  Signed: Kate Sable, M.D., F.A.C.C.  10/25/2019, 9:04 AM

## 2019-10-25 NOTE — Addendum Note (Signed)
Addended by: Kate Sable A on: 10/25/2019 09:53 AM   Modules accepted: Level of Service

## 2019-10-25 NOTE — Addendum Note (Signed)
Addended by: Levonne Hubert on: 10/25/2019 03:55 PM   Modules accepted: Orders

## 2019-10-25 NOTE — Telephone Encounter (Signed)
  Patient Consent for Virtual Visit         Alec [name] has provided verbal consent on 10/25/2019 for a virtual visit (video or telephone).   CONSENT FOR VIRTUAL VISIT FOR:  Alec [name]  By participating in this virtual visit I agree to the following:  I hereby voluntarily request, consent and authorize [facility] and its employed or contracted physicians, physician assistants, nurse practitioners or other licensed health care professionals (the Practitioner), to provide me with telemedicine health care services (the "Services") as deemed necessary by the treating Practitioner. I acknowledge and consent to receive the Services by the Practitioner via telemedicine. I understand that the telemedicine visit will involve communicating with the Practitioner through live audiovisual communication technology and the disclosure of certain medical information by electronic transmission. I acknowledge that I have been given the opportunity to request an in-person assessment or other available alternative prior to the telemedicine visit and am voluntarily participating in the telemedicine visit.  I understand that I have the right to withhold or withdraw my consent to the use of telemedicine in the course of my care at any time, without affecting my right to future care or treatment, and that the Practitioner or I may terminate the telemedicine visit at any time. I understand that I have the right to inspect all information obtained and/or recorded in the course of the telemedicine visit and may receive copies of available information for a reasonable fee.  I understand that some of the potential risks of receiving the Services via telemedicine include:  Marland Kitchen Delay or interruption in medical evaluation due to technological equipment failure or disruption; . Information transmitted may not be sufficient (e.g. poor resolution of images) to allow for appropriate medical decision making by the  Practitioner; and/or  . In rare instances, security protocols could fail, causing a breach of personal health information.  Furthermore, I acknowledge that it is my responsibility to provide information about my medical history, conditions and care that is complete and accurate to the best of my ability. I acknowledge that Practitioner's advice, recommendations, and/or decision may be based on factors not within their control, such as incomplete or inaccurate data provided by me or distortions of diagnostic images or specimens that may result from electronic transmissions. I understand that the practice of medicine is not an exact science and that Practitioner makes no warranties or guarantees regarding treatment outcomes. I acknowledge that a copy of this consent can be made available to me via my patient portal ([facility]), or I can request a printed copy by calling the office of [facility].    I understand that my insurance will be billed for this visit.   I have read or had this consent read to me. . I understand the contents of this consent, which adequately explains the benefits and risks of the Services being provided via telemedicine.  . I have been provided ample opportunity to ask questions regarding this consent and the Services and have had my questions answered to my satisfaction. . I give my informed consent for the services to be provided through the use of telemedicine in my medical care

## 2019-11-08 ENCOUNTER — Encounter: Payer: Self-pay | Admitting: Medical

## 2019-11-11 ENCOUNTER — Other Ambulatory Visit: Payer: Self-pay

## 2019-11-11 ENCOUNTER — Ambulatory Visit: Payer: 59 | Admitting: Physician Assistant

## 2019-11-11 ENCOUNTER — Encounter: Payer: Self-pay | Admitting: Physician Assistant

## 2019-11-11 DIAGNOSIS — L570 Actinic keratosis: Secondary | ICD-10-CM | POA: Diagnosis not present

## 2019-11-11 DIAGNOSIS — D485 Neoplasm of uncertain behavior of skin: Secondary | ICD-10-CM

## 2019-11-11 NOTE — Patient Instructions (Signed)

## 2019-11-11 NOTE — Progress Notes (Signed)
   Follow up Visit  Subjective  Alec [name] is a 64 y.o. male who presents for the following: Skin Problem (Has a place on left lower calf. States it has some flaking and increased in size. Has been frozen in the past. No bleeding.). Was frozen but has gotten bigger. It isnt sore and hasnt bled.    Objective  Well appearing patient in no apparent distress; mood and affect are within normal limits.  All skin waist up examined not including scalp. No suspicious moles noted on back.   Objective  Left Hand - Posterior, Right Hand - Posterior (4): Erythematous patches with gritty scale.  Left Lower Leg - Posterior     Assessment & Plan  AK (actinic keratosis) (5) Left Hand - Posterior; Right Hand - Posterior (4)  Destruction of lesion - Left Hand - Posterior, Right Hand - Posterior Complexity: simple   Destruction method: cryotherapy   Informed consent: discussed and consent obtained   Timeout:  patient name, date of birth, surgical site, and procedure verified Lesion destroyed using liquid nitrogen: Yes   Outcome: patient tolerated procedure well with no complications    Neoplasm of uncertain behavior of skin Left Lower Leg - Posterior  Skin / nail biopsy Type of biopsy: tangential   Informed consent: discussed and consent obtained   Procedure prep:  Patient was prepped and draped in usual sterile fashion (Non sterile) Prep type:  Chlorhexidine Anesthesia: the lesion was anesthetized in a standard fashion   Anesthetic:  1% lidocaine w/ epinephrine 1-100,000 local infiltration Instrument used: flexible razor blade   Hemostasis achieved with: ferric subsulfate   Outcome: patient tolerated procedure well   Post-procedure details: sterile dressing applied and wound care instructions given   Dressing type: bandage and petrolatum   Additional details:  Small piece of larger lesion.  Specimen 1 - Surgical pathology Differential Diagnosis: ISK vs SCC Check Margins:  No

## 2019-11-13 ENCOUNTER — Telehealth: Payer: Self-pay | Admitting: *Deleted

## 2019-11-13 NOTE — Telephone Encounter (Signed)
Phone call to patient. Mail box is full.

## 2019-11-13 NOTE — Telephone Encounter (Signed)
-----   Message from Arlyss Gandy, [place] sent at 11/13/2019  1:44 PM EDT ----- Benign. If we need to freeze the rest of this lesion we can.

## 2019-11-16 ENCOUNTER — Other Ambulatory Visit: Payer: Self-pay | Admitting: Medical

## 2019-11-23 ENCOUNTER — Other Ambulatory Visit: Payer: Self-pay | Admitting: Medical

## 2019-11-24 ENCOUNTER — Telehealth: Payer: Self-pay | Admitting: Medical

## 2019-11-24 NOTE — Telephone Encounter (Signed)
Reviewed pt ct abdomen done by urologist. When is his next appointment. Just want to make sure he has one in June so can review ct findings with him

## 2019-11-25 MED ORDER — HYDROCORTISONE ACE-PRAMOXINE 1-1 % RE FOAM: 1.0000 | Freq: Two times a day (BID) | RECTAL | 0 refills | Status: DC

## 2019-11-25 NOTE — Telephone Encounter (Signed)
Ok thanks 

## 2019-11-25 NOTE — Telephone Encounter (Signed)
6/22 

## 2019-11-26 ENCOUNTER — Telehealth: Payer: Self-pay

## 2019-11-26 NOTE — Telephone Encounter (Signed)
Requested most recent labs from [facility] medical records, Alec [name]. BMET to be faxed to [place].I will have scanned and sent to Dr.Koneswaran   BUN/Creat received,scanned to media, routed to Dr.Koneswaran

## 2019-11-26 NOTE — Telephone Encounter (Signed)
Dr.Koneswaran read BUN/Creat as "normal"

## 2019-11-28 ENCOUNTER — Telehealth (HOSPITAL_COMMUNITY): Payer: Self-pay | Admitting: *Deleted

## 2019-11-28 NOTE — Telephone Encounter (Signed)
Reaching out to patient to offer assistance regarding upcoming cardiac imaging study; pt verbalizes understanding of appt date/time, parking situation and where to check in, pre-test NPO status and medications ordered, and verified current allergies; name and call back number provided for further questions should they arise  Lerae Langham Tai RN Navigator Cardiac Imaging [facility] [phone] office [phone] cell 

## 2019-11-29 ENCOUNTER — Other Ambulatory Visit: Payer: Self-pay

## 2019-11-29 ENCOUNTER — Ambulatory Visit (HOSPITAL_COMMUNITY)
Admission: RE | Admit: 2019-11-29 | Discharge: 2019-11-29 | Disposition: A | Discharge status: Home / Self Care | Payer: 59 | Source: Ambulatory Visit | Attending: Cardiovascular Disease | Admitting: Cardiovascular Disease

## 2019-11-29 DIAGNOSIS — I2583 Coronary atherosclerosis due to lipid rich plaque: Secondary | ICD-10-CM | POA: Insufficient documentation

## 2019-11-29 DIAGNOSIS — I2510 Atherosclerotic heart disease of native coronary artery without angina pectoris: Secondary | ICD-10-CM | POA: Diagnosis present

## 2019-11-29 MED ORDER — IOHEXOL 350 MG/ML SOLN
100.0000 mL | Freq: Once | INTRAVENOUS | Status: AC | PRN
  Administered 2019-11-29: 100 mL via INTRAVENOUS

## 2019-11-29 MED ORDER — NITROGLYCERIN 0.4 MG SL SUBL
SUBLINGUAL_TABLET | SUBLINGUAL | Status: AC
  Filled 2019-11-29: qty 2

## 2019-11-29 MED ORDER — METOPROLOL TARTRATE 5 MG/5ML IV SOLN
5.0000 mg | INTRAVENOUS | Status: DC | PRN
  Administered 2019-11-29: 5 mg via INTRAVENOUS

## 2019-11-29 MED ORDER — METOPROLOL TARTRATE 5 MG/5ML IV SOLN
INTRAVENOUS | Status: AC
  Filled 2019-11-29: qty 10

## 2019-11-29 MED ORDER — NITROGLYCERIN 0.4 MG SL SUBL
0.8000 mg | SUBLINGUAL_TABLET | Freq: Once | SUBLINGUAL | Status: AC
  Administered 2019-11-29: 0.8 mg via SUBLINGUAL

## 2019-11-29 NOTE — Progress Notes (Signed)
CT scan completed. Tolerated well. D/C home ambulatory with wife. Awake and alert.

## 2019-11-30 NOTE — Telephone Encounter (Signed)
I called and spoke with the patient and discussed his coronary CT angiography results.  I told him to start taking aspirin 81 mg daily and to increase rosuvastatin to 20 mg daily.  We also talked about the importance of smoking cessation.  I will follow up with him in the office in [place] at the end of July.

## 2019-12-02 ENCOUNTER — Telehealth: Payer: Self-pay | Admitting: *Deleted

## 2019-12-02 ENCOUNTER — Telehealth: Payer: 59 | Admitting: Emergency Medicine

## 2019-12-02 ENCOUNTER — Telehealth: Payer: Self-pay | Admitting: Medical

## 2019-12-02 DIAGNOSIS — J069 Acute upper respiratory infection, unspecified: Secondary | ICD-10-CM

## 2019-12-02 MED ORDER — ROSUVASTATIN CALCIUM 20 MG PO TABS
ORAL_TABLET | ORAL | 3 refills | Status: DC
Start: 2019-12-02 — End: 2020-01-06

## 2019-12-02 NOTE — Telephone Encounter (Signed)
Saw cardiologist note that he wants to increase crestor to 20 mg daily and also start 81 mg aspirin daily.(apirin over the counter.  So sent higher dose crestor in.

## 2019-12-02 NOTE — Telephone Encounter (Signed)
Herminio Commons, MD  12/02/2019 8:53 AM EDT    I already called Mr. Pasierb with test results over the weekend.   Herminio Commons, MD  11/29/2019 5:03 PM EDT    Coronary calcium score is very high. He has mild blockages. I would recommend starting aspirin 81 mg daily and increasing rosuvastatin to 20 mg daily. Tobacco cessation would be very beneficial.    Copy to pcp.

## 2019-12-02 NOTE — Progress Notes (Signed)
E-Visit for Corona Virus Screening  Your current symptoms could be consistent with the coronavirus.  Many health care providers can now test patients at their office but not all are.  [facility] has multiple testing sites. For information on our COVID testing locations and hours go to [email or web address]  According to the current IDSA guidelines, you do not meet criteria for bacterial infection requiring antibiotic therapy as treatment. Those guidelines are as follows:  1) Persistent symptoms or signs of acute rhinosinusitis lasting 10 days or more without clinical improvement  2) Severe symptoms and signs of high fever (102F [39C] or higher) and purulent nasal discharge or facial pain for three days or more at the beginning of illness  3) Worsening symptoms or signs (i.e., fever, headache, or increase in nasal discharge) that lasted five to six days, got better, and then worsened (double-sickening)     We are enrolling you in our [place] for [place] . Daily you will receive a questionnaire within the [employer] website. Our COVID 19 response team will be monitoring your responses daily.  Testing Information: The COVID-19 Community Testing sites will begin testing BY APPOINTMENT ONLY.  You can schedule online at [email or web address]  If you do not have access to a smart phone or computer you may call [phone] for an appointment.   Additional testing sites in the Community:  . For [facility] Testing sites in [facility]  [email or web address]  . For Pop-up testing sites in [place]  BowlDirectory.co.uk  . For Testing sites with regular hours [email or web address]  . For [place] [email or web address]  . For [facility]  [email or web address]  . For [facility] testing in [place] and [employer] [email or web address]  . For [facility] testing in [facility]   [email or web address]  For  more information about community testing call [phone]   Please quarantine yourself while awaiting your test results. Please stay home for a minimum of 10 days from the first day of illness with improving symptoms and you have had 24 hours of no fever (without the use of Tylenol (Acetaminophen) Motrin (Ibuprofen) or any fever reducing medication).  Also - Do not get tested prior to returning to work because once you have had a positive test the test can stay positive for more then a month in some cases.   You should wear a mask or cloth face covering over your nose and mouth if you must be around other people or animals, including pets (even at home). Try to stay at least 6 feet away from other people. This will protect the people around you.  Please continue good preventive care measures, including:  frequent hand-washing, avoid touching your face, cover coughs/sneezes, stay out of crowds and keep a 6 foot distance from others.  COVID-19 is a respiratory illness with symptoms that are similar to the flu. Symptoms are typically mild to moderate, but there have been cases of severe illness and death due to the virus.   The following symptoms may appear 2-14 days after exposure: . Fever . Cough . Shortness of breath or difficulty breathing . Chills . Repeated shaking with chills . Muscle pain . Headache . Sore throat . New loss of taste or smell . Fatigue . Congestion or runny nose . Nausea or vomiting . Diarrhea  Go to the nearest hospital ED for assessment if fever/cough/breathlessness are severe or illness seems  like a threat to life.  It is vitally important that if you feel that you  have an infection such as this virus or any other virus that you stay home and away from places where you may spread it to others.  You should avoid contact with people age 64 and older.    You may also take acetaminophen (Tylenol) as needed for fever.  Reduce your risk of any infection by using the same precautions used for avoiding the common cold or flu:  Marland Kitchen Wash your hands often with soap and warm water for at least 20 seconds.  If soap and water are not readily available, use an alcohol-based hand sanitizer with at least 60% alcohol.  . If coughing or sneezing, cover your mouth and nose by coughing or sneezing into the elbow areas of your shirt or coat, into a tissue or into your sleeve (not your hands). . Avoid shaking hands with others and consider head nods or verbal greetings only. . Avoid touching your eyes, nose, or mouth with unwashed hands.  . Avoid close contact with people who are sick. . Avoid places or events with large numbers of people in one location, like concerts or sporting events. . Carefully consider travel plans you have or are making. . If you are planning any travel outside or inside the [place], visit the CDC's Travelers' Health webpage for the latest health notices. . If you have some symptoms but not all symptoms, continue to monitor at home and seek medical attention if your symptoms worsen. . If you are having a medical emergency, call 911.  HOME CARE . Only take medications as instructed by your medical team. . Drink plenty of fluids and get plenty of rest. . A steam or ultrasonic humidifier can help if you have congestion.   GET HELP RIGHT AWAY IF YOU HAVE EMERGENCY WARNING SIGNS** FOR COVID-19. If you or someone is showing any of these signs seek emergency medical care immediately. Call 911 or proceed to your closest emergency facility if: . You develop worsening high fever. . Trouble  breathing . Bluish lips or face . Persistent pain or pressure in the chest . New confusion . Inability to wake or stay awake . You cough up blood. . Your symptoms become more severe  **This list is not all possible symptoms. Contact your medical provider for any symptoms that are sever or concerning to you.  MAKE SURE YOU   Understand these instructions.  Will watch your condition.  Will get help right away if you are not doing well or get worse.  Your e-visit answers were reviewed by a board certified advanced clinical practitioner to complete your personal care plan.  Depending on the condition, your plan could have included both over the counter or prescription medications.  If there is a problem please reply once you have received a response from your provider.  Your safety is important to [place].  If you have drug allergies check your prescription carefully.    You can use MyChart to ask questions about today's visit, request a non-urgent call back, or ask for a work or school excuse for 24 hours related to this e-Visit. If it has been greater than 24 hours you will need to follow up with your provider, or enter a new e-Visit to address those concerns. You will get an e-mail in the next two days asking about your experience.  I hope that your e-visit has been valuable and will speed your recovery. Thank you for using e-visits.   Greater than 5 but less than 10 minutes spent  researching, coordinating, and implementing care for this patient today

## 2019-12-03 MED FILL — ROSUVASTATIN CALCIUM 20 MG: 20 | 90 days supply | Qty: 90 | Fill #0

## 2019-12-10 ENCOUNTER — Other Ambulatory Visit: Payer: Self-pay

## 2019-12-10 MED ORDER — LEVOCETIRIZINE DIHYDROCHLORIDE 5 MG PO TABS: 5.0000 mg | ORAL_TABLET | Freq: Every evening | ORAL | 3 refills | Status: DC

## 2019-12-11 ENCOUNTER — Ambulatory Visit: Payer: 59 | Admitting: Physician Assistant

## 2019-12-20 ENCOUNTER — Other Ambulatory Visit: Payer: Self-pay

## 2019-12-20 ENCOUNTER — Telehealth: Payer: Self-pay

## 2019-12-20 NOTE — Telephone Encounter (Signed)
Pt. Called needing refill on Tamsulosin. Has appt in July. Pt. Wanted pharmacy changed to [facility] in [place] from [place]. A verbal order was called into Pharmacy for Tamsulosin 0.4 tab 1 tab daily 30 with one refill.

## 2019-12-31 ENCOUNTER — Telehealth: Payer: Self-pay | Admitting: Medical

## 2019-12-31 ENCOUNTER — Ambulatory Visit: Payer: 59 | Admitting: Medical

## 2019-12-31 NOTE — Telephone Encounter (Signed)
Patient is requesting an order for A1C placed prior to appointment with provider. Patient would like to discuss labs at appointment.

## 2019-12-31 NOTE — Telephone Encounter (Signed)
Yes, but he cancelled his appt for today

## 2019-12-31 NOTE — Telephone Encounter (Signed)
Do you know how to use our a1c. This pt wants to have a1c done same day of visit. Usually he comes in early. But I think too late to get scheduled and get result now.

## 2020-01-06 ENCOUNTER — Other Ambulatory Visit: Payer: Self-pay | Admitting: *Deleted

## 2020-01-06 MED ORDER — ROSUVASTATIN CALCIUM 20 MG PO TABS: ORAL_TABLET | ORAL | 3 refills | Status: DC

## 2020-01-14 ENCOUNTER — Other Ambulatory Visit: Payer: Self-pay

## 2020-01-14 ENCOUNTER — Encounter: Payer: Self-pay | Admitting: Urology

## 2020-01-14 ENCOUNTER — Ambulatory Visit (INDEPENDENT_AMBULATORY_CARE_PROVIDER_SITE_OTHER): Payer: 59 | Admitting: Urology

## 2020-01-14 VITALS — BP 127/69 | HR 81 | Temp 98.7°F | Ht 69.0 in | Wt 188.0 lb

## 2020-01-14 DIAGNOSIS — R3915 Urgency of urination: Secondary | ICD-10-CM | POA: Diagnosis not present

## 2020-01-14 DIAGNOSIS — Z8546 Personal history of malignant neoplasm of prostate: Secondary | ICD-10-CM

## 2020-01-14 DIAGNOSIS — R3121 Asymptomatic microscopic hematuria: Secondary | ICD-10-CM

## 2020-01-14 DIAGNOSIS — N401 Enlarged prostate with lower urinary tract symptoms: Secondary | ICD-10-CM

## 2020-01-14 LAB — POCT URINALYSIS DIPSTICK
Bilirubin, UA: NEGATIVE
Blood, UA: NEGATIVE
Glucose, UA: POSITIVE — AB
Ketones, UA: NEGATIVE
Leukocytes, UA: NEGATIVE
Nitrite, UA: NEGATIVE
Protein, UA: NEGATIVE
Spec Grav, UA: >=1.03 — AB (ref 1.010–1.025)
Urobilinogen, UA: 0.2 E.U./dL
pH, UA: 5 (ref 5.0–8.0)

## 2020-01-14 MED ORDER — TAMSULOSIN HCL 0.4 MG PO CAPS: 0.4000 mg | ORAL_CAPSULE | Freq: Two times a day (BID) | ORAL | 3 refills | Status: DC

## 2020-01-14 MED ORDER — CIPROFLOXACIN HCL 500 MG PO TABS
500.0000 mg | ORAL_TABLET | Freq: Once | ORAL | Status: AC
  Administered 2020-01-14: 500 mg via ORAL

## 2020-01-14 NOTE — Progress Notes (Signed)
H&P  History of Present Illness: This man presents for follow-up of prostate cancer.  9.27.2019--he underwent ultrasound and biopsy of his prostate.  PSA 4.02, prostate size 30 mL, 2 cores revealed GS 3+3 pattern.  PSAD 0.13.  Because of his rising PSA and relatively high volume prostate cancer, he was referred for radiotherapy and underwent brachytherapy with SpaceOAR instillation on 3.12.2020.  He has had an excellent PSA response, most recently 0.27 in April 2021.  7.6.2021: He has been noted to have microscopic hematuria on more than one occasion.  He denies gross hematuria.  He does have moderate voiding symptomatology while on tamsulosin.  He is a cigarette smoker.      Past Medical History:  Diagnosis Date   ALLERGIC RHINITIS    Allergy    Arthritis    Diabetes mellitus without complication (HCC)    type 2   DIVERTICULITIS, HX OF    GERD    LEG PAIN, LEFT    Prostate cancer ([place])    SINUSITIS, CHRONIC    SMOKER     Past Surgical History:  Procedure Laterality Date   FINGER SURGERY     PROSTATE BIOPSY     RADIOACTIVE SEED IMPLANT N/A 09/20/2018   Procedure: RADIOACTIVE SEED IMPLANT/BRACHYTHERAPY IMPLANT;  Surgeon: Irine Seal, MD;  Location: [facility];  Service: Urology;  Laterality: N/A;   SPACE OAR INSTILLATION N/A 09/20/2018   Procedure: SPACE OAR INSTILLATION;  Surgeon: Irine Seal, MD;  Location: [facility];  Service: Urology;  Laterality: N/A;   SPINE SURGERY     lumbar surgery 2012.    Home Medications:  Allergies as of 01/14/2020   No Known Allergies     Medication List       Accurate as of January 14, 2020 10:24 AM. If you have any questions, ask your nurse or doctor.        azelastine 0.1 % nasal spray Commonly known as: ASTELIN Place 2 sprays into both nostrils 2 (two) times daily. Use in each nostril as directed   glucose blood test strip Commonly known as: OneTouch Verio Use as instructed     hydrocortisone-pramoxine rectal foam Commonly known as: PROCTOFOAM-HC Place 1 applicator rectally 2 (two) times daily.   levocetirizine 5 MG tablet Commonly known as: XYZAL Take 1 tablet (5 mg total) by mouth every evening.   metFORMIN 500 MG tablet Commonly known as: GLUCOPHAGE TAKE 1 TABLET BY MOUTH TWICE DAILY WITH A MEAL   metoprolol tartrate 100 MG tablet Commonly known as: LOPRESSOR Take 100 mg 2 hours before CT   OneTouch Delica Lancets 53I Misc Use as directed.   rosuvastatin 20 MG tablet Commonly known as: Crestor 1 tab po q day   tamsulosin 0.4 MG Caps capsule Commonly known as: FLOMAX Take 0.4 mg by mouth daily.       Allergies: No Known Allergies  Family History  Problem Relation Age of Onset   Alzheimer's disease Mother    Heart disease Father    Parkinsonism Father    Alcohol abuse Brother    Colon cancer Neg Hx    Esophageal cancer Neg Hx    Liver cancer Neg Hx    Pancreatic cancer Neg Hx    Rectal cancer Neg Hx    Stomach cancer Neg Hx     Social History:  reports that he has been smoking. He has a 30.00 pack-year smoking history. He has never used smokeless tobacco. He reports that he does not  drink alcohol and does not use drugs.  ROS: Cobb, Hope, LPNLicensed Practical Nurse 10:00 AM          Show:Clear all [x] Manual[x] Template[] Copied  Added by: [x] Cobb, Hope, LPN  [] Hover for details Urological Symptom Review  Patient is experiencing the following symptoms: Leakage of urine   Review of Systems  Gastrointestinal (upper)  : Negative for upper GI symptoms  Gastrointestinal (lower) : Diarrhea Constipation  Constitutional : Negative for symptoms  Skin: Negative for skin symptoms  Eyes: Negative for eye symptoms  Ear/Nose/Throat : Sinus problems  Hematologic/Lymphatic: Negative for Hematologic/Lymphatic symptoms  Cardiovascular : Negative for cardiovascular symptoms  Respiratory  : Negative for respiratory symptoms  Endocrine: Negative for endocrine symptoms  Musculoskeletal: Negative for musculoskeletal symptoms  Neurological: Negative for neurological symptoms  Psychologic: Negative for psychiatric symptoms               Physical Exam:  Vital signs in last 24 hours: BP 127/69    Pulse 81    Temp 98.7 F (37.1 C)    Ht 5\' 9"  (1.753 m)    Wt 188 lb (85.3 kg)    BMI 27.76 kg/m  Constitutional:  Alert and oriented, No acute distress Cardiovascular: Regular rate  Respiratory: Normal respiratory effort Genitourinary: Normal male phallus, testes are descended bilaterally and non-tender and without masses, scrotum is normal in appearance without lesions or masses, perineum is normal on inspection. Neurologic: Grossly intact, no focal deficits Psychiatric: Normal mood and affect  Cystoscopy Procedure Note:  Indication: Microscopic hematuria  After informed consent and discussion of the procedure and its risks, Alec [name] was positioned and prepped in the standard fashion.  Cystoscopy was performed with a flexible cystoscope.   Findings: Urethra: No lesions Prostate: Minimally obstructive Bladder neck: Normal Ureteral orifices: Normal in their location and configuration Bladder: Normal urothelium, no stones  The patient tolerated the procedure well.      Results for orders placed or performed in visit on 01/14/20 (from the past 24 hour(s))  POCT urinalysis dipstick     Status: Abnormal   Collection Time: 01/14/20 10:18 AM  Result Value Ref Range   Color, UA amber    Clarity, UA     Glucose, UA Positive (A) Negative   Bilirubin, UA neg    Ketones, UA neg    Spec Grav, UA >=1.030 (A) 1.010 - 1.025   Blood, UA neg    pH, UA 5.0 5.0 - 8.0   Protein, UA Negative Negative   Urobilinogen, UA 0.2 0.2 or 1.0 E.U./dL   Nitrite, UA neg    Leukocytes, UA Negative Negative   Appearance clear    Odor      I have reviewed prior pt  notes  I have reviewed notes from referring/previous physicians  I have reviewed urinalysis results  I have independently reviewed prior imaging  I have reviewed prior PSA results  I have reviewed prior urine culture Impression/Assessment:  1.  History of low risk prostate cancer, over a year out from radiotherapy with excellent PSA response  2.  Microscopic hematuria with negative CT and cystoscopy  Plan:  1 reassured regarding his cystoscopy  2.  I will see back in 4 months following PSA.  The patient states he wants to follow-up with me.

## 2020-01-14 NOTE — Progress Notes (Signed)
See progress note.

## 2020-01-23 ENCOUNTER — Encounter: Payer: Self-pay | Admitting: Medical

## 2020-01-31 ENCOUNTER — Other Ambulatory Visit: Payer: 59

## 2020-01-31 ENCOUNTER — Other Ambulatory Visit: Payer: Self-pay

## 2020-01-31 ENCOUNTER — Other Ambulatory Visit (INDEPENDENT_AMBULATORY_CARE_PROVIDER_SITE_OTHER): Payer: 59

## 2020-01-31 DIAGNOSIS — R739 Hyperglycemia, unspecified: Secondary | ICD-10-CM

## 2020-01-31 DIAGNOSIS — E781 Pure hyperglyceridemia: Secondary | ICD-10-CM | POA: Diagnosis not present

## 2020-02-01 LAB — HEMOGLOBIN A1C
Hgb A1c MFr Bld: 6.3 % of total Hgb — ABNORMAL HIGH (ref <5.7)
Mean Plasma Glucose: 134 (calc)
eAG (mmol/L): 7.4 (calc)

## 2020-02-01 LAB — LIPID PANEL
Cholesterol: 106 mg/dL (ref <200)
HDL: 38 mg/dL — ABNORMAL LOW (ref >=40)
LDL Cholesterol (Calc): 49 mg/dL (calc)
Non-HDL Cholesterol (Calc): 68 mg/dL (calc) (ref <130)
Total CHOL/HDL Ratio: 2.8 (calc) (ref <5.0)
Triglycerides: 105 mg/dL (ref <150)

## 2020-02-03 ENCOUNTER — Ambulatory Visit: Payer: 59 | Admitting: Medical

## 2020-02-07 ENCOUNTER — Telehealth: Payer: 59 | Admitting: Cardiovascular Disease

## 2020-02-07 ENCOUNTER — Other Ambulatory Visit: Payer: Self-pay

## 2020-02-07 ENCOUNTER — Ambulatory Visit: Payer: 59 | Admitting: Medical

## 2020-02-07 ENCOUNTER — Ambulatory Visit: Payer: 59 | Admitting: Family Medicine

## 2020-02-07 VITALS — BP 130/85 | HR 69 | Temp 98.0°F | Resp 18 | Ht 69.0 in | Wt 191.0 lb

## 2020-02-07 DIAGNOSIS — E781 Pure hyperglyceridemia: Secondary | ICD-10-CM | POA: Diagnosis not present

## 2020-02-07 DIAGNOSIS — I2510 Atherosclerotic heart disease of native coronary artery without angina pectoris: Secondary | ICD-10-CM

## 2020-02-07 DIAGNOSIS — F17200 Nicotine dependence, unspecified, uncomplicated: Secondary | ICD-10-CM | POA: Diagnosis not present

## 2020-02-07 DIAGNOSIS — J449 Chronic obstructive pulmonary disease, unspecified: Secondary | ICD-10-CM | POA: Diagnosis not present

## 2020-02-07 DIAGNOSIS — E118 Type 2 diabetes mellitus with unspecified complications: Secondary | ICD-10-CM

## 2020-02-07 MED ORDER — BUDESONIDE-FORMOTEROL FUMARATE 80-4.5 MCG/ACT IN AERO
2.0000 | INHALATION_SPRAY | Freq: Two times a day (BID) | RESPIRATORY_TRACT | 3 refills | Status: DC
Start: 2020-02-07 — End: 2020-03-23

## 2020-02-07 NOTE — Progress Notes (Signed)
   Subjective:    Patient ID: Alec [name], male    DOB: 11/05/1955, 64 y.o.   MRN: [id number]  HPI  Pt upset about processes in office. Scheduling labs, appointment etc. Various complaints today. Apologized for these issues.   Pt a1c was better than it was in past. Recent a1c was 6.3.   Pt has CAD. Recent lipid med has increased. 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  Pt aware of copd type findings. Cut back to 12 cig a day.he states does wheeze some dialy but minimal.   PSA being followed by urologist. Hx of some blood in urine. Pt declined urologist advise on getting cystoscopy. Pt switched urologist and second urologist did recommend cystoscopy as well.  Pt  he eventually agreed to cystoscopy. Result was negative.   Pt has not gotten covid vaccine. He refuses/made up his mind.    Review of Systems  HENT: Positive for congestion.        Chronic for yeasr. Uses vics nasal spray chronically. He states other sprays such as astelin and flonase don't help.  Respiratory: Negative for choking, shortness of breath and wheezing.        On and off wheezing.  Cardiovascular: Negative for chest pain and palpitations.  Gastrointestinal: Positive for abdominal pain.  Musculoskeletal: Negative for back pain.  Psychiatric/Behavioral: Negative for behavioral problems and decreased concentration.       Objective:   Physical Exam   General- No acute distress. Pleasant patient. Neck- Full range of motion, no jvd Lungs- Clear, even and unlabored. Heart- regular rate and rhythm. Neurologic- CNII- XII grossly intact.      Assessment & Plan:  For copd, hx of smoking and intermittent wheezing, I prescribed symbicort. Also recommend cutting back further/stopping smoking.   For CAD and hx of diabetes continue current statin.  For hx of prosate CA continue to follow up regularly with urologist.  For hx of hematuria and negative cystoscopy  recommend stop smoking. If you have any gross blood in urine or clots notify us. Follow urologist advise in future if they recommend further work up as history of smoking factor in bladder cancer.  Putting in future a1c, lipid panel and cmp for 3 months. For prescheduling of labs recommend scheduling in advance. Can send me my chart message next time and I can help coordinate scheduling.  Nasal congestion chronic in nature. The main component of afrin is oxymetazoline. Check you vicks nasal spray to see if basically using oxymetazoline. If so then recommend brief taper prednisone and restart flonase.   Follow up 3 months or as needed.  Time spent with patient today was 30  minutes which consisted of chart review, discussing diagnosis, work up, treatment, answering question, counseling on nasal congestion  and documentation.  Mackie Pai, PA-C

## 2020-02-07 NOTE — Patient Instructions (Signed)
For copd, hx of smoking and intermittent wheezing, I prescribed symbicort. Also recommend cutting back further/stopping smoking.   For CAD and hx of diabetes continue current statin.  For hx of prosate CA continue to follow up regularly with urologist.  For hx of hematuria and negative cystoscopy recommend stop smoking. If you have any gross blood in urine or clots notify us. Follow urologist advise in future if they recommend further work up as history of smoking factor in bladder cancer.  Putting in future a1c, lipid panel and cmp for 3 months. For prescheduling of labs recommend scheduling in advance. Can send me my chart message next time and I can help coordinate scheduling.  Nasal congestion chronic in nature. The main component of afrin is oxymetazoline. Check you vicks nasal spray to see if basically using oxymetazoline. If so then recommend brief taper prednisone and restart flonase.   Follow up 3 months or as needed.

## 2020-02-24 ENCOUNTER — Ambulatory Visit
Admission: EM | Admit: 2020-02-24 | Discharge: 2020-02-24 | Disposition: A | Discharge status: Home / Self Care | Payer: 59 | Attending: Emergency Medicine | Admitting: Emergency Medicine

## 2020-02-24 ENCOUNTER — Other Ambulatory Visit: Payer: Self-pay

## 2020-02-24 DIAGNOSIS — S20462A Insect bite (nonvenomous) of left back wall of thorax, initial encounter: Secondary | ICD-10-CM

## 2020-02-24 DIAGNOSIS — S21202A Unspecified open wound of left back wall of thorax without penetration into thoracic cavity, initial encounter: Secondary | ICD-10-CM | POA: Diagnosis not present

## 2020-02-24 DIAGNOSIS — W57XXXA Bitten or stung by nonvenomous insect and other nonvenomous arthropods, initial encounter: Secondary | ICD-10-CM

## 2020-02-24 MED ORDER — DOXYCYCLINE HYCLATE 100 MG PO TABS
200.0000 mg | ORAL_TABLET | Freq: Once | ORAL | Status: AC
  Administered 2020-02-24: 200 mg via ORAL

## 2020-02-24 MED ORDER — MUPIROCIN 2 % EX OINT: 1.0000 "application " | TOPICAL_OINTMENT | Freq: Two times a day (BID) | CUTANEOUS | 0 refills | Status: DC

## 2020-02-24 MED FILL — MUPIROCIN 2% OINTMENT: 2 | 10 days supply | Qty: 22 | Fill #0

## 2020-02-24 NOTE — ED Provider Notes (Signed)
[place]   [id number] 02/24/20 Arrival Time: 5638  CC: SKIN COMPLAINT  SUBJECTIVE:  Alec [name] is a 64 y.o. male who presents with a spot on back x 3 days.  Denies precipitating event or trauma.  Speculates a bug may have bit him.  Localizes the rash to LT upper back.  Describes it as painful, itchy, and red.  Has tried OTC antibacterial ointment with temporary relief.  Symptoms are made worse to the touch.  Denies similar symptoms in the past.   Denies fever, chills, nausea, vomiting, discharge, SOB, chest pain, abdominal pain, changes in bowel or bladder function.    ROS: As per HPI.  All other pertinent ROS negative.     Past Medical History:  Diagnosis Date  . ALLERGIC RHINITIS   . Allergy   . Arthritis   . Diabetes mellitus without complication ([place])    type 2  . DIVERTICULITIS, HX OF   . GERD   . LEG PAIN, LEFT   . Prostate cancer ([place])   . SINUSITIS, CHRONIC   . SMOKER    Past Surgical History:  Procedure Laterality Date  . FINGER SURGERY    . PROSTATE BIOPSY    . RADIOACTIVE SEED IMPLANT N/A 09/20/2018   Procedure: RADIOACTIVE SEED IMPLANT/BRACHYTHERAPY IMPLANT;  Surgeon: Irine Seal, MD;  Location: [facility];  Service: Urology;  Laterality: N/A;  . SPACE OAR INSTILLATION N/A 09/20/2018   Procedure: SPACE OAR INSTILLATION;  Surgeon: Irine Seal, MD;  Location: [facility];  Service: Urology;  Laterality: N/A;  . SPINE SURGERY     lumbar surgery 2012.   No Known Allergies No current facility-administered medications on file prior to encounter.   Current Outpatient Medications on File Prior to Encounter  Medication Sig Dispense Refill  . azelastine (ASTELIN) 0.1 % nasal spray Place 2 sprays into both nostrils 2 (two) times daily. Use in each nostril as directed 30 mL 2  . budesonide-formoterol (SYMBICORT) 80-4.5 MCG/ACT inhaler Inhale 2 puffs into the lungs 2 (two) times daily. 1 Inhaler 3  . glucose blood  (ONETOUCH VERIO) test strip Use as instructed 100 each 12  . hydrocortisone-pramoxine (PROCTOFOAM-HC) rectal foam Place 1 applicator rectally 2 (two) times daily. 10 g 0  . levocetirizine (XYZAL) 5 MG tablet Take 1 tablet (5 mg total) by mouth every evening. 30 tablet 3  . metFORMIN (GLUCOPHAGE) 500 MG tablet TAKE 1 TABLET BY MOUTH TWICE DAILY WITH A MEAL 180 tablet 0  . metoprolol tartrate (LOPRESSOR) 100 MG tablet Take 100 mg 2 hours before CT 1 tablet 0  . ONETOUCH DELICA LANCETS 75I MISC Use as directed. 100 each 12  . rosuvastatin (CRESTOR) 20 MG tablet 1 tab po q day 90 tablet 3  . tamsulosin (FLOMAX) 0.4 MG CAPS capsule Take 0.4 mg by mouth daily.    . tamsulosin (FLOMAX) 0.4 MG CAPS capsule Take 1 capsule (0.4 mg total) by mouth 2 (two) times daily. 180 capsule 3   Social History   Socioeconomic History  . Marital status: Married    Spouse name: Not on file  . Number of children: Not on file  . Years of education: Not on file  . Highest education level: Not on file  Occupational History  . Occupation: Librarian, academic   Tobacco Use  . Smoking status: Current Every Day Smoker    Packs/day: 0.75    Years: 40.00    Pack years: 30.00  . Smokeless tobacco: Never Used  .  Tobacco comment: trying to quit, from 1 1/2 ppd to 13-14/day  Vaping Use  . Vaping Use: Never used  Substance and Sexual Activity  . Alcohol use: No  . Drug use: No  . Sexual activity: Yes  Other Topics Concern  . 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 Difficulty of Paying Living Expenses:   Food Insecurity:   . Worried About Charity fundraiser in the Last Year:   . Arboriculturist in the Last Year:   Transportation Needs:   . Film/video editor (Medical):   Marland Kitchen Lack of Transportation (Non-Medical):   Physical Activity:   . Days of Exercise per Week:     . Minutes of Exercise per Session:   Stress:   . Feeling of Stress :   Social Connections:   . Frequency of Communication with Friends and Family:   . Frequency of Social Gatherings with Friends and Family:   . Attends Religious Services:   . Active Member of Clubs or Organizations:   . Attends Archivist Meetings:   Marland Kitchen Marital Status:   Intimate Partner Violence:   . Fear of Current or Ex-Partner:   . Emotionally Abused:   Marland Kitchen Physically Abused:   . Sexually Abused:    Family History  Problem Relation Age of Onset  . Alzheimer's disease Mother   . Heart disease Father   . Parkinsonism Father   . Alcohol abuse Brother   . Colon cancer Neg Hx   . Esophageal cancer Neg Hx   . Liver cancer Neg Hx   . Pancreatic cancer Neg Hx   . Rectal cancer Neg Hx   . Stomach cancer Neg Hx     OBJECTIVE: Vitals:   02/24/20 1506  BP: 122/76  Pulse: 93  Resp: 17  Temp: 98.3 F (36.8 C)  TempSrc: Oral  SpO2: 97%    General appearance: alert; no distress Head: NCAT Lungs: normal respiratory effort Skin: warm and dry; scab with 1-2 cm of surrounding erythema to LT upper back, mildly TTP, no obvious drainage or bleeding Psychological: alert and cooperative; normal mood and affect  ASSESSMENT & PLAN:  1. Wound of left side of back, initial encounter   2. Insect bite of left back wall of thorax, initial encounter     Meds ordered this encounter  Medications  . mupirocin ointment (BACTROBAN) 2 %    Sig: Apply 1 application topically 2 (two) times daily.    Dispense:  30 g    Refill:  0    Order Specific Question:   Supervising Provider    Answer:   Raylene Everts [[id number]]  . doxycycline (VIBRA-TABS) tablet 200 mg    Given prophylactic dose of doxycycline 200 mg To prevent tick bites, wear long sleeves, long pants, and light colors. Use insect repellent. Follow the instructions on the bottle. If the tick is biting, do not try to remove it with heat, alcohol,  petroleum jelly, or fingernail polish. Use tweezers, curved forceps, or a tick-removal tool to grasp the tick. Gently pull up until the tick lets go. Do not twist or jerk the tick. Do not squeeze or crush the tick. Bactroban ointment prescribed.  Use as directed Return here or go to ER if you have any new or worsening symptoms (rash,  nausea, vomiting, fever, chills, headache, fatigue)    Reviewed expectations re: course of current medical issues. Questions answered. Outlined signs and symptoms indicating need for more acute intervention. Patient verbalized understanding. After Visit Summary given.   Lestine Box, PA-C 02/24/20 [phone]

## 2020-02-24 NOTE — ED Triage Notes (Signed)
See provider note. 

## 2020-02-24 NOTE — Discharge Instructions (Signed)
Given prophylactic dose of doxycycline 200 mg To prevent tick bites, wear long sleeves, long pants, and light colors. Use insect repellent. Follow the instructions on the bottle. If the tick is biting, do not try to remove it with heat, alcohol, petroleum jelly, or fingernail polish. Use tweezers, curved forceps, or a tick-removal tool to grasp the tick. Gently pull up until the tick lets go. Do not twist or jerk the tick. Do not squeeze or crush the tick. Bactroban ointment prescribed.  Use as directed Return here or go to ER if you have any new or worsening symptoms (rash, nausea, vomiting, fever, chills, headache, fatigue)

## 2020-03-20 ENCOUNTER — Other Ambulatory Visit: Payer: Self-pay | Admitting: Medical

## 2020-03-20 MED ORDER — METFORMIN HCL 500 MG PO TABS: 500.0000 mg | ORAL_TABLET | Freq: Two times a day (BID) | ORAL | 0 refills | Status: DC

## 2020-03-22 NOTE — Progress Notes (Signed)
Cardiology Office Note  Date: 03/23/2020   ID: Alec [name], DOB April 13, 1956, MRN [id number]  PCP:  Mackie Pai, PA-C  Cardiologist:  No primary care provider on file. Electrophysiologist:  None   Chief Complaint: CAD   History of Present Illness: Alec [name] is a 64 y.o. male with a history of CAD, tobacco abuse, prostate cancer, DM type II, COPD (smoker x45 years x1.5 packs/day).   Recent chest CT for lung cancer screening demonstrating aortic atherosclerosis and atherosclerosis of the great vessels of mediastinum and coronary arteries including calcified atherosclerotic plaque in LAD/LCx/RCA.  Last saw Dr. Bronson Ing 10/25/2019: He denied any history of anginal pain.  Had some gradually increasing shortness of breath over the past 5 years which may be due to worsening COPD but ischemic heart disease could not entirely be excluded.  Risk factors include tobacco abuse, type II DM.  Dr. Bronson Ing ordered a coronary CT.  Patient continued rosuvastatin and was down to smoking 10 to 13 cigarettes daily and was motivated to quit. He was continuing metformin. His COPD was stable.  Coronary CTA CAC score was very high 1949. Dr Bronson Ing recommended he start ASA 81 mg and increase Crestor to 20 mg daily.   Patient is here for 34-month follow-up.  He denies any issues in the interim since last visit.  Denies any chest pain, or exertional dyspnea.  No palpitations or orthostatic symptoms, CVA or TIA-like symptoms, PND, orthopnea, bleeding issues, claudication-like issues, DVT or PE-like symptoms or lower extremity edema.  States he does pretty much anything he wants to do.  He mows his yard, works in his shop, works in the [employer].  Works helping other people.   Past Medical History:  Diagnosis Date  . ALLERGIC RHINITIS   . Allergy   . Arthritis   . Diabetes mellitus without complication ([place])    type 2  . DIVERTICULITIS, HX OF   . GERD   . LEG PAIN, LEFT   .  Prostate cancer ([place])   . SINUSITIS, CHRONIC   . SMOKER     Past Surgical History:  Procedure Laterality Date  . FINGER SURGERY    . PROSTATE BIOPSY    . RADIOACTIVE SEED IMPLANT N/A 09/20/2018   Procedure: RADIOACTIVE SEED IMPLANT/BRACHYTHERAPY IMPLANT;  Surgeon: Irine Seal, MD;  Location: [facility];  Service: Urology;  Laterality: N/A;  . SPACE OAR INSTILLATION N/A 09/20/2018   Procedure: SPACE OAR INSTILLATION;  Surgeon: Irine Seal, MD;  Location: [facility];  Service: Urology;  Laterality: N/A;  . SPINE SURGERY     lumbar surgery 2012.    Current Outpatient Medications  Medication Sig Dispense Refill  . glucose blood (ONETOUCH VERIO) test strip Use as instructed 100 each 12  . hydrocortisone-pramoxine (PROCTOFOAM-HC) rectal foam Place 1 applicator rectally 2 (two) times daily. 10 g 0  . levocetirizine (XYZAL) 5 MG tablet Take 1 tablet (5 mg total) by mouth every evening. 30 tablet 3  . metFORMIN (GLUCOPHAGE) 500 MG tablet Take 1 tablet (500 mg total) by mouth 2 (two) times daily with a meal. 180 tablet 0  . metoprolol tartrate (LOPRESSOR) 100 MG tablet Take 100 mg 2 hours before CT 1 tablet 0  . mupirocin ointment (BACTROBAN) 2 % Apply 1 application topically 2 (two) times daily. 30 g 0  . ONETOUCH DELICA LANCETS 23R MISC Use as directed. 100 each 12  . rosuvastatin (CRESTOR) 20 MG tablet 1 tab po q day 90  tablet 3  . tamsulosin (FLOMAX) 0.4 MG CAPS capsule Take 1 capsule (0.4 mg total) by mouth 2 (two) times daily. 180 capsule 3   No current facility-administered medications for this visit.   Allergies:  Patient has no known allergies.   Social History: The patient  reports that he has been smoking. He has a 30.00 pack-year smoking history. He has never used smokeless tobacco. He reports that he does not drink alcohol and does not use drugs.   Family History: [name] family history includes Alcohol abuse in his brother; Alzheimer's  disease in his mother; Heart disease in his father; Parkinsonism in his father.   ROS:  Please see the history of present illness. Otherwise, complete review of systems is positive for none.  All other systems are reviewed and negative.   Physical Exam: VS:  BP 108/60   Pulse 68   Ht 5\' 10"  (1.778 m)   Wt 191 lb (86.6 kg)   SpO2 98%   BMI 27.41 kg/m , BMI Body mass index is 27.41 kg/m.  Wt Readings from Last 3 Encounters:  03/23/20 191 lb (86.6 kg)  02/07/20 191 lb (86.6 kg)  01/14/20 188 lb (85.3 kg)    General: Patient appears comfortable at rest. Neck: Supple, no elevated JVP or carotid bruits, no thyromegaly. Lungs: Clear to auscultation, nonlabored breathing at rest. Cardiac: Regular rate and rhythm, no S3 or significant systolic murmur, no pericardial rub. Extremities: No pitting edema, distal pulses 2+. Skin: Warm and dry. Musculoskeletal: No kyphosis. Neuropsychiatric: Alert and oriented x3, affect grossly appropriate.  ECG:  EKG October 25, 2019 normal sinus rhythm with a rate of 78  Recent Labwork: 09/02/2019: ALT 18; AST 16; BUN 18; Creatinine, Ser 0.92; Hemoglobin 11.9; Platelets 212.0; Potassium 4.5; Sodium 139     Component Value Date/Time   CHOL 106 01/31/2020 1516   TRIG 105 01/31/2020 1516   HDL 38 (L) 01/31/2020 1516   CHOLHDL 2.8 01/31/2020 1516   VLDL 19.0 09/16/2019 0902   LDLCALC 49 01/31/2020 1516    Other Studies Reviewed Today:   Coronary CTA 11/29/2019 FINDINGS: Non-cardiac: See separate report from [facility]. No significant findings on limited lung and soft tissue windows.  Calcium Score: Fairly marked 3 vessel coronary calcium  Coronary Arteries: Right dominant with no anomalies  LM: Short segment normal  LAD: 25-49% calcific plaque in proximal and mid vessel  D1: Normal  D2: Normal  D3: Normal  Circumflex: 1-24% calcific plaque in mid vessel  OM1: Normal  OM2: Normal  RCA: 25-49% calcific plaque in mid  RCA, 1-24% calcific plaque in distal RCA  PDA: Normal  PLA: Normal  IMPRESSION: 1. Normal aortic root 3.5 cm  2. 3 vessel coronary calcium score 1949 which is 5 th percentile for age and sex  32.  CAD RADS 2 non obstructive CAD see description above   Assessment and Plan:  1. CAD in native artery   2. Tobacco abuse   3. Chronic obstructive pulmonary disease, unspecified COPD type ([place])   4. Type 2 diabetes mellitus without complication, without long-term current use of insulin ([place])    1. CAD in native artery Recent CTA on 11/29/2019 showed fairly marked three-vessel coronary calcium score of 1949.  LAD 25 to 49% plaque in proximal and mid vessel.  Circumflex 1 to 24% calcific plaque in mid vessel.  RCA 25 to 49% plaque in the mid RCA.  1 to 24% plaque in distal RCA.  No significant obstructive  disease per coronary CTA.  Patient denies any anginal or exertional symptoms.  Continue Crestor 20 mg p.o. twice daily.  Continue aspirin 81 mg daily.  2. Tobacco abuse Patient has a 67.5-pack-year history of smoking.  He continues to smoke 10 to 13 cigarettes/day.  States he is finding it hard to quit.  Highly advised cessation if possible patient states he is trying.  He is also vaping some.  3. Chronic obstructive pulmonary disease, unspecified COPD type ([place]) Recent CT scan for lung cancer screening showed mild diffuse bronchial wall thickening with mild centrilobular and paraseptal emphysema, ending findings suggestive of underlying COPD.  Also aortic atherosclerosis.  4. Type 2 diabetes mellitus without complication, without long-term current use of insulin ([place]) Patient states his diabetes is well controlled.  States his last hemoglobin A1c was less than 6.  Continue to follow with PCP for management.  Continues on Metformin 100 mg  Medication Adjustments/Labs and Tests Ordered: Current medicines are reviewed at length with the patient today.  Concerns regarding medicines are  outlined above.   Disposition: Follow-up with Domenic Polite or APP 6 months  Signed, Levell July, NP 03/23/2020 9:15 AM    [facility] [address], [place], [place] [address] Phone: [phone]; Fax: [phone]

## 2020-03-23 ENCOUNTER — Other Ambulatory Visit: Payer: Self-pay

## 2020-03-23 ENCOUNTER — Ambulatory Visit (INDEPENDENT_AMBULATORY_CARE_PROVIDER_SITE_OTHER): Payer: 59 | Admitting: Family Medicine

## 2020-03-23 ENCOUNTER — Encounter: Payer: Self-pay | Admitting: Family Medicine

## 2020-03-23 VITALS — BP 108/60 | HR 68 | Ht 70.0 in | Wt 191.0 lb

## 2020-03-23 DIAGNOSIS — E119 Type 2 diabetes mellitus without complications: Secondary | ICD-10-CM

## 2020-03-23 DIAGNOSIS — Z720 Tobacco use: Secondary | ICD-10-CM | POA: Diagnosis not present

## 2020-03-23 DIAGNOSIS — J449 Chronic obstructive pulmonary disease, unspecified: Secondary | ICD-10-CM | POA: Diagnosis not present

## 2020-03-23 DIAGNOSIS — I2510 Atherosclerotic heart disease of native coronary artery without angina pectoris: Secondary | ICD-10-CM | POA: Diagnosis not present

## 2020-03-23 NOTE — Patient Instructions (Signed)
Medication Instructions:  Continue all current medications.   Labwork: none  Testing/Procedures: none  Follow-Up: 6 months   Any Other Special Instructions Will Be Listed Below (If Applicable).   If you need a refill on your cardiac medications before your next appointment, please call your pharmacy.  

## 2020-05-08 NOTE — Addendum Note (Signed)
Addended by: Manuela Schwartz on: 05/08/2020 03:33 PM   Modules accepted: Orders

## 2020-05-11 ENCOUNTER — Other Ambulatory Visit: Payer: Self-pay

## 2020-05-11 ENCOUNTER — Other Ambulatory Visit (INDEPENDENT_AMBULATORY_CARE_PROVIDER_SITE_OTHER): Payer: 59

## 2020-05-11 DIAGNOSIS — Z8546 Personal history of malignant neoplasm of prostate: Secondary | ICD-10-CM

## 2020-05-11 DIAGNOSIS — E118 Type 2 diabetes mellitus with unspecified complications: Secondary | ICD-10-CM

## 2020-05-11 DIAGNOSIS — I2510 Atherosclerotic heart disease of native coronary artery without angina pectoris: Secondary | ICD-10-CM

## 2020-05-11 DIAGNOSIS — E781 Pure hyperglyceridemia: Secondary | ICD-10-CM

## 2020-05-11 NOTE — Addendum Note (Signed)
Addended by: Kelle Darting A on: 05/11/2020 08:52 AM   Modules accepted: Orders

## 2020-05-12 LAB — COMPREHENSIVE METABOLIC PANEL
ALT: 23 IU/L (ref 0–44)
AST: 19 IU/L (ref 0–40)
Albumin/Globulin Ratio: 2.1 (ref 1.2–2.2)
Albumin: 4.7 g/dL (ref 3.8–4.8)
Alkaline Phosphatase: 70 IU/L (ref 44–121)
BUN/Creatinine Ratio: 21 (ref 10–24)
BUN: 21 mg/dL (ref 8–27)
Bilirubin Total: 0.8 mg/dL (ref 0.0–1.2)
CO2: 27 mmol/L (ref 20–29)
Calcium: 9.3 mg/dL (ref 8.6–10.2)
Chloride: 102 mmol/L (ref 96–106)
Creatinine, Ser: 1.01 mg/dL (ref 0.76–1.27)
GFR calc Af Amer: 91 mL/min/{1.73_m2} (ref >59)
GFR calc non Af Amer: 79 mL/min/{1.73_m2} (ref >59)
Globulin, Total: 2.2 g/dL (ref 1.5–4.5)
Glucose: 127 mg/dL — ABNORMAL HIGH (ref 65–99)
Potassium: 4.9 mmol/L (ref 3.5–5.2)
Sodium: 143 mmol/L (ref 134–144)
Total Protein: 6.9 g/dL (ref 6.0–8.5)

## 2020-05-12 LAB — MICROALBUMIN / CREATININE URINE RATIO
Creatinine, Urine: 142.5 mg/dL
Microalb/Creat Ratio: 6 mg/g creat (ref 0–29)
Microalbumin, Urine: 8.8 ug/mL

## 2020-05-12 LAB — HEMOGLOBIN A1C
Est. average glucose Bld gHb Est-mCnc: 134 mg/dL
Hgb A1c MFr Bld: 6.3 % — ABNORMAL HIGH (ref 4.8–5.6)

## 2020-05-12 LAB — LIPID PANEL
Chol/HDL Ratio: 2.6 ratio (ref 0.0–5.0)
Cholesterol, Total: 100 mg/dL (ref 100–199)
HDL: 38 mg/dL — ABNORMAL LOW (ref >39)
LDL Chol Calc (NIH): 48 mg/dL (ref 0–99)
Triglycerides: 60 mg/dL (ref 0–149)
VLDL Cholesterol Cal: 14 mg/dL (ref 5–40)

## 2020-05-19 ENCOUNTER — Ambulatory Visit: Payer: 59 | Admitting: Urology

## 2020-07-07 ENCOUNTER — Other Ambulatory Visit: Payer: Self-pay

## 2020-07-07 ENCOUNTER — Ambulatory Visit (INDEPENDENT_AMBULATORY_CARE_PROVIDER_SITE_OTHER): Payer: 59 | Admitting: Urology

## 2020-07-07 ENCOUNTER — Encounter: Payer: Self-pay | Admitting: Urology

## 2020-07-07 VITALS — BP 124/77 | HR 69 | Temp 98.3°F | Ht 70.0 in | Wt 193.0 lb

## 2020-07-07 DIAGNOSIS — Z8546 Personal history of malignant neoplasm of prostate: Secondary | ICD-10-CM | POA: Diagnosis not present

## 2020-07-07 DIAGNOSIS — N401 Enlarged prostate with lower urinary tract symptoms: Secondary | ICD-10-CM

## 2020-07-07 LAB — URINALYSIS, ROUTINE W REFLEX MICROSCOPIC
Bilirubin, UA: NEGATIVE
Glucose, UA: NEGATIVE
Ketones, UA: NEGATIVE
Leukocytes,UA: NEGATIVE
Nitrite, UA: NEGATIVE
Protein,UA: NEGATIVE
RBC, UA: NEGATIVE
Specific Gravity, UA: 1.025 (ref 1.005–1.030)
Urobilinogen, Ur: 0.2 mg/dL (ref 0.2–1.0)
pH, UA: 5.5 (ref 5.0–7.5)

## 2020-07-07 NOTE — Progress Notes (Signed)
H&P  Chief Complaint: H/O PCa  History of Present Illness:  12.28.2021: Pt here for 4 month f/u and denies any blood per urine or stool or significant change to his LUTS between visits. He continues on tamsulosin. He denies any ED symptoms at this time.  (below copied from [facility] records):  This man presents for follow-up of prostate cancer.  9.27.2019--he underwent ultrasound and biopsy of his prostate.  PSA 4.02, prostate size 30 mL, 2 cores revealed GS 3+3 pattern.  PSAD 0.13.  Because of his rising PSA and relatively high volume prostate cancer, he was referred for radiotherapy and underwent brachytherapy with SpaceOAR instillation on 3.12.2020.  He has had an excellent PSA response, most recently 0.27 in April 2021.  7.6.2021: He has been noted to have microscopic hematuria on more than one occasion.  He denies gross hematuria.  He does have moderate voiding symptomatology while on tamsulosin.  He is a cigarette smoker.  Past Medical History:  Diagnosis Date  . ALLERGIC RHINITIS   . Allergy   . Arthritis   . Diabetes mellitus without complication ([facility])    type 2  . DIVERTICULITIS, HX OF   . GERD   . LEG PAIN, LEFT   . Prostate cancer ([facility])   . SINUSITIS, CHRONIC   . SMOKER     Past Surgical History:  Procedure Laterality Date  . FINGER SURGERY    . PROSTATE BIOPSY    . RADIOACTIVE SEED IMPLANT N/A 09/20/2018   Procedure: RADIOACTIVE SEED IMPLANT/BRACHYTHERAPY IMPLANT;  Surgeon: Bjorn Pippin, MD;  [facility];  Service: Urology;  Laterality: N/A;  . SPACE OAR INSTILLATION N/A 09/20/2018   Procedure: SPACE OAR INSTILLATION;  Surgeon: Bjorn Pippin, MD;  Location[facility];  Service: Urology;  Laterality: N/A;  . SPINE SURGERY     lumbar surgery 2012.    Home Medications:  Allergies as of 07/07/2020   No Known Allergies     Medication List       Accurate as of July 07, 2020 12:48 PM. If you have any questions, ask your  nurse or doctor.        glucose blood test strip Commonly known as: OneTouch Verio Use as instructed   hydrocortisone-pramoxine rectal foam Commonly known as: PROCTOFOAM-HC Place 1 applicator rectally 2 (two) times daily.   levocetirizine 5 MG tablet Commonly known as: XYZAL Take 1 tablet (5 mg total) by mouth every evening.   metFORMIN 500 MG tablet Commonly known as: GLUCOPHAGE Take 1 tablet (500 mg total) by mouth 2 (two) times daily with a meal.   metoprolol tartrate 100 MG tablet Commonly known as: LOPRESSOR Take 100 mg 2 hours before CT   mupirocin ointment 2 % Commonly known as: BACTROBAN Apply 1 application topically 2 (two) times daily.   OneTouch Delica Lancets 33G Misc Use as directed.   rosuvastatin 20 MG tablet Commonly known as: Crestor 1 tab po q day   tamsulosin 0.4 MG Caps capsule Commonly known as: FLOMAX Take 1 capsule (0.4 mg total) by mouth 2 (two) times daily.       Allergies: No Known Allergies  Family History  Problem Relation Age of Onset  . Alzheimer's disease Mother   . Heart disease Father   . Parkinsonism Father   . Alcohol abuse Brother   . Colon cancer Neg Hx   . Esophageal cancer Neg Hx   . Liver cancer Neg Hx   . Pancreatic cancer Neg Hx   .  Rectal cancer Neg Hx   . Stomach cancer Neg Hx     Social History:  reports that he has been smoking. He has a 30.00 pack-year smoking history. He has never used smokeless tobacco. He reports that he does not drink alcohol and does not use drugs.  ROS: A complete review of systems was performed.  All systems are negative except for pertinent findings as noted.  Physical Exam:  Vital signs in last 24 hours: There were no vitals taken for this visit. Constitutional:  Alert and oriented, No acute distress Respiratory: Normal respiratory effort GI: Abdomen is soft, nontender, nondistended, no abdominal masses. No CVAT. No hernias. Genitourinary: Normal male phallus, testes are  descended bilaterally and non-tender and without masses, scrotum is normal in appearance without lesions or masses, perineum is normal on inspection. Prostate feels smooth and flat. Neurologic: Grossly intact, no focal deficits Psychiatric: Normal mood and affect  I have reviewed prior pt notes  I have reviewed notes from referring/previous physicians  I have reviewed urinalysis results  I have independently reviewed prior imaging  I have reviewed prior PSA/pathology results    Impression/Assessment:  History of PCa -status post brachytherapy in 2020.  He seems to be doing well with excellent PSA response and minimal radiation related toxicity  BPH - Pt symptoms are well managed on tamsulosin.  Plan:  1. Pt advised to reduce his tamsulosin regimen from BID to QD as is tolerable.  2. Pt PSA will be drawn today.  3. F/U in 6 months for OV, PSA, DRE, and symptom recheck.  CC: Dr. Mackie Pai

## 2020-07-07 NOTE — Progress Notes (Signed)
Urological Symptom Review ° °Patient is experiencing the following symptoms: °Get up at night to urinate °Leakage of urine ° ° °Review of Systems ° °Gastrointestinal (upper)  : °Negative for upper GI symptoms ° °Gastrointestinal (lower) : °Negative for lower GI symptoms ° °Constitutional : °Negative for symptoms ° °Skin: °Negative for skin symptoms ° °Eyes: °Negative for eye symptoms ° °Ear/Nose/Throat : °Sinus problems ° °Hematologic/Lymphatic: °Negative for Hematologic/Lymphatic symptoms ° °Cardiovascular : °Negative for cardiovascular symptoms ° °Respiratory : °Negative for respiratory symptoms ° °Endocrine: °Negative for endocrine symptoms ° °Musculoskeletal: °Negative for musculoskeletal symptoms ° °Neurological: °Negative for neurological symptoms ° °Psychologic: °Negative for psychiatric symptoms °

## 2020-07-08 LAB — PSA: Prostate Specific Ag, Serum: 0.6 ng/mL (ref 0.0–4.0)

## 2020-08-17 ENCOUNTER — Telehealth: Payer: Self-pay

## 2020-08-17 ENCOUNTER — Other Ambulatory Visit: Payer: Self-pay | Admitting: Medical

## 2020-08-17 NOTE — Telephone Encounter (Signed)
-----   Message from Patrick L McKenzie, MD sent at 08/17/2020  9:45 AM EST ----- normal ----- Message ----- From: [place], LPN Sent: 07/08/2020   8:33 AM EST To: Patrick L McKenzie, MD  Pls review.  

## 2020-08-17 NOTE — Telephone Encounter (Signed)
Attempted to call pt but unable to leave message.

## 2020-08-24 ENCOUNTER — Telehealth: Payer: Self-pay

## 2020-08-24 NOTE — Telephone Encounter (Signed)
-----   Message from Cleon Gustin, MD sent at 08/17/2020  9:45 AM EST ----- normal ----- Message ----- From: Valentina Lucks, LPN Sent: 07/46/0029   8:33 AM EST To: Cleon Gustin, MD  Pls review.

## 2020-08-24 NOTE — Telephone Encounter (Signed)
Unable to leave message on pt's phone.

## 2020-08-28 NOTE — Progress Notes (Signed)
Still unable to reach pt or leave vm.

## 2020-08-31 NOTE — Progress Notes (Signed)
Attempted sulyiple times to notify patient.

## 2020-09-20 IMAGING — CT CT HEART MORP W/ CTA COR W/ SCORE W/ CA W/CM &/OR W/O CM
4 of 7 series · 8 of 20 positions shown, 9 images · IV contrast (APPLIED)
Comparison: 09/20/2019

Addendum:
CLINICAL DATA: Chest pain

EXAM:
Cardiac CTA
MEDICATIONS:
Sub lingual nitro. 4mg and lopressor mg
TECHNIQUE: The patient was scanned on a Siemens Force [REDACTED]ice scanner. Gantry
rotation speed was 250 msecs. Collimation was .6 mm. A 100 kV
prospective scan was triggered in the ascending thoracic aorta at
140 HU's Full mA was used between 35% and 75% of the R-R interval.
Average HR during the scan was 63 bpm. The 3D data set was
interpreted on a dedicated work station using MPR, MIP and VRT
modes. A total of 80cc of contrast was used.

[Series 6: best diast 73 % · axial · 0.39mm/px · z∈[-184,-142]mm · 2 of 318 slices shown, 3 images]
[im 106/318  vessel]
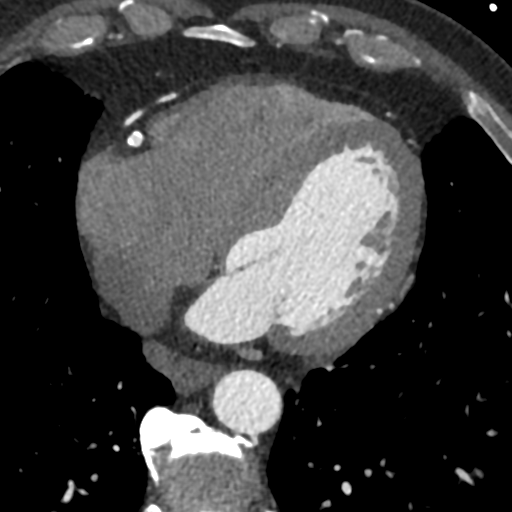
[im 106/318  lung]
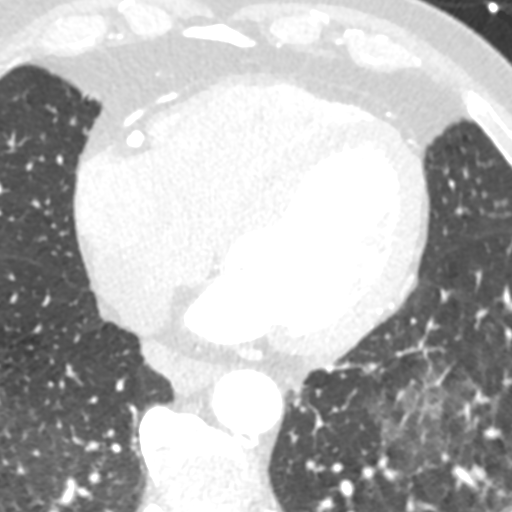
[im 212/318  vessel]
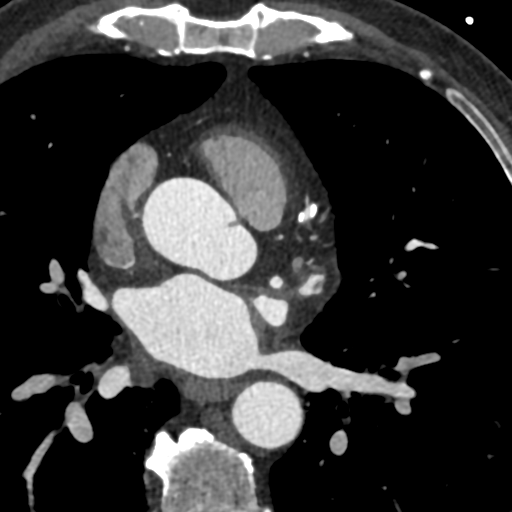

[Series 7: best syst 38 % · axial · 0.39mm/px · z∈[-184,-142]mm · 2 of 318 slices shown]
[im 106/318  vessel]
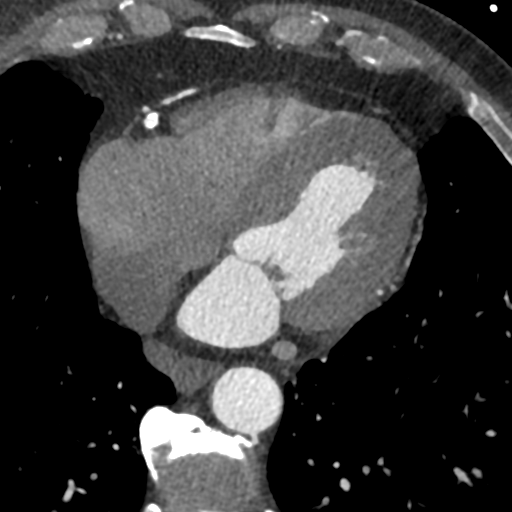
[im 212/318  vessel]
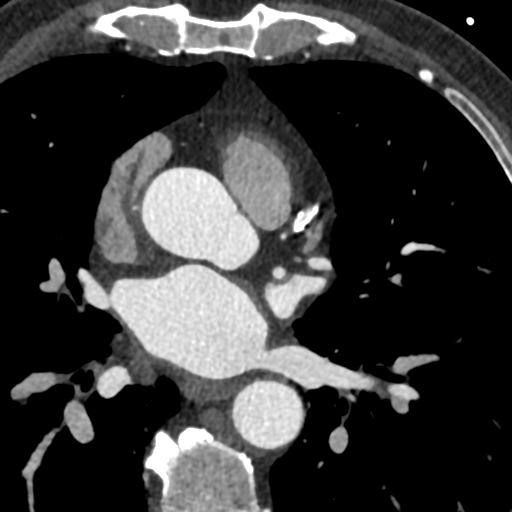

[Series 8: ts diast sharp 73 % · axial · 0.39mm/px · z∈[-184,-142]mm · 2 of 318 slices shown]
[im 106/318  lung]
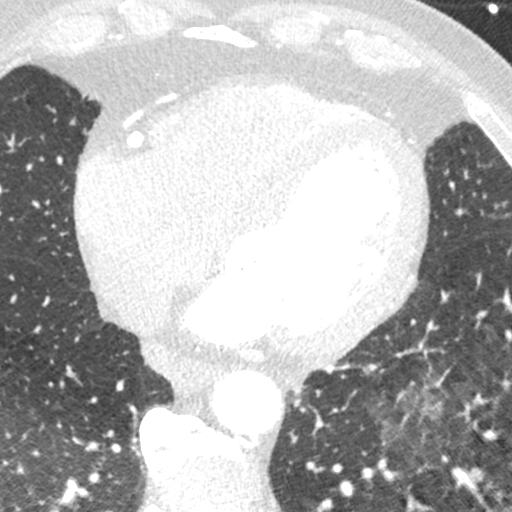
[im 212/318  lung]
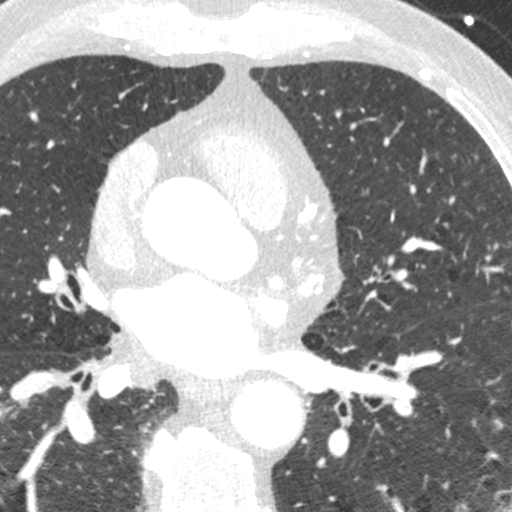

[Series 9: ts syst sharp 38 % · axial · 0.39mm/px · z∈[-184,-142]mm · 2 of 318 slices shown]
[im 106/318  lung]
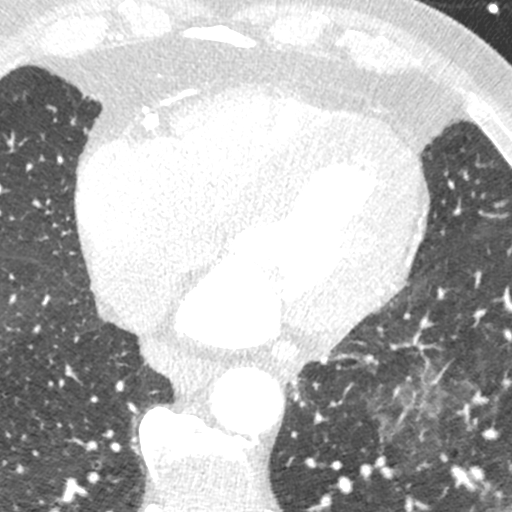
[im 212/318  lung]
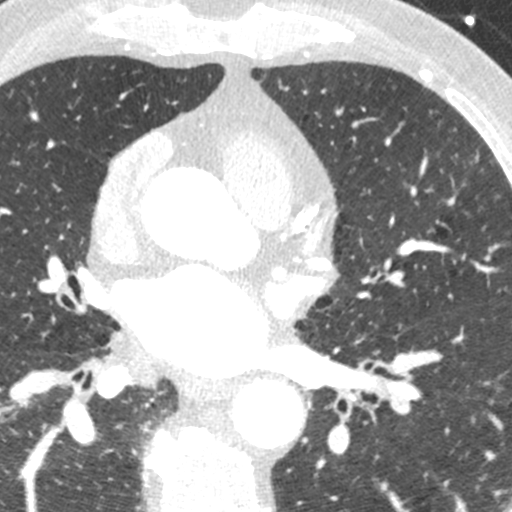

[8 of 20 positions shown; findings below may reference images not displayed]

FINDINGS: Non-cardiac: See separate report from [REDACTED]. No
significant findings on limited lung and soft tissue windows.

Calcium Score: Fairly marked 3 vessel coronary calcium

Coronary Arteries: Right dominant with no anomalies

LM: Short segment normal

LAD: 25-49% calcific plaque in proximal and mid vessel

D1: Normal

D2: Normal

D3: Normal

Circumflex: 1-24% calcific plaque in mid vessel

OM1: Normal

OM2: Normal

RCA: 25-49% calcific plaque in mid RCA, 1-24% calcific plaque in
distal RCA

PDA: Normal

PLA: Normal
IMPRESSION: 1. Normal aortic root 3.5 cm

2. 3 vessel coronary calcium score 5999 which is 98 th percentile
for age and sex

3.  CAD RADS 2 non obstructive CAD see description above

Youtarou Emmott

EXAM:
OVER-READ INTERPRETATION  CT CHEST

The following report is an over-read performed by radiologist Dr.
over-read does not include interpretation of cardiac or coronary
anatomy or pathology. The coronary CTA/coronary calcium score
interpretation by the cardiologist is attached.
FINDINGS: Vascular: Normal heart size.  No pericardial effusion identified.

Mediastinum/nodes: No mass or adenopathy identified.

Lungs/pleura: Centrilobular and paraseptal emphysema. No pleural
effusion, airspace consolidation or atelectasis.

Upper abdomen: The imaged portions of the upper abdomen are
unremarkable.

Musculoskeletal: No acute or significant osseous findings.
Degenerative disc disease noted within the imaged portions of the
lower thoracic spine.
IMPRESSION: 1. Emphysema.
2. No additional supplement noncardiac findings.

*** End of Addendum ***
FINDINGS: Non-cardiac: See separate report from [REDACTED]. No
significant findings on limited lung and soft tissue windows.

Calcium Score: Fairly marked 3 vessel coronary calcium

Coronary Arteries: Right dominant with no anomalies

LM: Short segment normal

LAD: 25-49% calcific plaque in proximal and mid vessel

D1: Normal

D2: Normal

D3: Normal

Circumflex: 1-24% calcific plaque in mid vessel

OM1: Normal

OM2: Normal

RCA: 25-49% calcific plaque in mid RCA, 1-24% calcific plaque in
distal RCA

PDA: Normal

PLA: Normal
IMPRESSION: 1. Normal aortic root 3.5 cm

2. 3 vessel coronary calcium score 5999 which is 98 th percentile
for age and sex

3.  CAD RADS 2 non obstructive CAD see description above

Youtarou Emmott

## 2020-11-04 ENCOUNTER — Other Ambulatory Visit: Payer: Self-pay | Admitting: Medical

## 2020-12-10 ENCOUNTER — Encounter: Payer: Self-pay | Admitting: Urology

## 2020-12-11 ENCOUNTER — Other Ambulatory Visit: Payer: Self-pay | Admitting: *Deleted

## 2020-12-11 MED ORDER — ROSUVASTATIN CALCIUM 20 MG PO TABS: 20.0000 mg | ORAL_TABLET | Freq: Every day | ORAL | 1 refills | Status: DC

## 2020-12-28 ENCOUNTER — Other Ambulatory Visit: Payer: 59

## 2020-12-28 ENCOUNTER — Other Ambulatory Visit: Payer: Self-pay

## 2020-12-28 DIAGNOSIS — Z8546 Personal history of malignant neoplasm of prostate: Secondary | ICD-10-CM

## 2020-12-28 NOTE — Telephone Encounter (Signed)
Patient called inquiring about ED medication. Patient informed that you are currently on vacation and someone will contact him once you return.

## 2020-12-29 LAB — PSA: Prostate Specific Ag, Serum: 0.6 ng/mL (ref 0.0–4.0)

## 2020-12-30 ENCOUNTER — Telehealth: Payer: Self-pay | Admitting: Medical

## 2020-12-30 ENCOUNTER — Telehealth: Payer: Self-pay | Admitting: *Deleted

## 2020-12-30 DIAGNOSIS — E118 Type 2 diabetes mellitus with unspecified complications: Secondary | ICD-10-CM

## 2020-12-30 DIAGNOSIS — E781 Pure hyperglyceridemia: Secondary | ICD-10-CM

## 2020-12-30 DIAGNOSIS — R739 Hyperglycemia, unspecified: Secondary | ICD-10-CM

## 2020-12-30 NOTE — Telephone Encounter (Signed)
Future labs placed. 

## 2020-12-30 NOTE — Telephone Encounter (Signed)
Pt has lab appt at 7:15 in the morning. I do not see any orders by our office in [place].  Please place future orders if appropriate or call pt to cancel lab appt if labs not needed at this time.

## 2020-12-31 ENCOUNTER — Other Ambulatory Visit (INDEPENDENT_AMBULATORY_CARE_PROVIDER_SITE_OTHER): Payer: 59

## 2020-12-31 ENCOUNTER — Other Ambulatory Visit: Payer: Self-pay

## 2020-12-31 DIAGNOSIS — E118 Type 2 diabetes mellitus with unspecified complications: Secondary | ICD-10-CM

## 2020-12-31 DIAGNOSIS — E781 Pure hyperglyceridemia: Secondary | ICD-10-CM | POA: Diagnosis not present

## 2020-12-31 LAB — LIPID PANEL
Cholesterol: 105 mg/dL (ref 0–200)
HDL: 36 mg/dL — ABNORMAL LOW (ref >39.00)
LDL Cholesterol: 51 mg/dL (ref 0–99)
NonHDL: 68.59
Total CHOL/HDL Ratio: 3
Triglycerides: 88 mg/dL (ref 0.0–149.0)
VLDL: 17.6 mg/dL (ref 0.0–40.0)

## 2020-12-31 LAB — COMPREHENSIVE METABOLIC PANEL
ALT: 19 U/L (ref 0–53)
AST: 15 U/L (ref 0–37)
Albumin: 4.7 g/dL (ref 3.5–5.2)
Alkaline Phosphatase: 65 U/L (ref 39–117)
BUN: 21 mg/dL (ref 6–23)
CO2: 31 mEq/L (ref 19–32)
Calcium: 9.4 mg/dL (ref 8.4–10.5)
Chloride: 102 mEq/L (ref 96–112)
Creatinine, Ser: 0.98 mg/dL (ref 0.40–1.50)
GFR: 81.43 mL/min (ref >60.00)
Glucose, Bld: 131 mg/dL — ABNORMAL HIGH (ref 70–99)
Potassium: 4.6 mEq/L (ref 3.5–5.1)
Sodium: 140 mEq/L (ref 135–145)
Total Bilirubin: 1.1 mg/dL (ref 0.2–1.2)
Total Protein: 6.8 g/dL (ref 6.0–8.3)

## 2020-12-31 LAB — HEMOGLOBIN A1C: Hgb A1c MFr Bld: 6.5 % (ref 4.6–6.5)

## 2021-01-05 ENCOUNTER — Ambulatory Visit: Payer: 59 | Admitting: Urology

## 2021-01-05 ENCOUNTER — Other Ambulatory Visit: Payer: Self-pay | Admitting: Urology

## 2021-01-05 ENCOUNTER — Other Ambulatory Visit (HOSPITAL_BASED_OUTPATIENT_CLINIC_OR_DEPARTMENT_OTHER): Payer: Self-pay

## 2021-01-05 DIAGNOSIS — N5201 Erectile dysfunction due to arterial insufficiency: Secondary | ICD-10-CM

## 2021-01-05 MED ORDER — SILDENAFIL CITRATE 100 MG PO TABS
ORAL_TABLET | ORAL | 99 refills | Status: DC
  Filled 2021-01-05: qty 6, 23d supply, fill #0

## 2021-01-05 NOTE — Progress Notes (Signed)
Sent via mychart

## 2021-01-06 ENCOUNTER — Other Ambulatory Visit: Payer: Self-pay

## 2021-01-06 ENCOUNTER — Other Ambulatory Visit (HOSPITAL_BASED_OUTPATIENT_CLINIC_OR_DEPARTMENT_OTHER): Payer: Self-pay

## 2021-01-06 DIAGNOSIS — K649 Unspecified hemorrhoids: Secondary | ICD-10-CM

## 2021-01-06 MED ORDER — HYDROCORTISONE ACE-PRAMOXINE 1-1 % EX CREA: 1.0000 "application " | TOPICAL_CREAM | Freq: Two times a day (BID) | CUTANEOUS | 0 refills | Status: DC

## 2021-01-06 MED ORDER — HYDROCORT-PRAMOXINE (PERIANAL) 1-1 % EX FOAM: 1.0000 | Freq: Two times a day (BID) | CUTANEOUS | Status: AC

## 2021-01-13 ENCOUNTER — Other Ambulatory Visit: Payer: Self-pay

## 2021-01-13 ENCOUNTER — Ambulatory Visit: Payer: BC Managed Care – PPO | Admitting: Medical

## 2021-01-13 ENCOUNTER — Encounter: Payer: Self-pay | Admitting: Medical

## 2021-01-13 VITALS — BP 117/68 | HR 72 | Resp 18 | Ht 70.0 in | Wt 183.0 lb

## 2021-01-13 DIAGNOSIS — E781 Pure hyperglyceridemia: Secondary | ICD-10-CM

## 2021-01-13 DIAGNOSIS — I2510 Atherosclerotic heart disease of native coronary artery without angina pectoris: Secondary | ICD-10-CM

## 2021-01-13 DIAGNOSIS — D649 Anemia, unspecified: Secondary | ICD-10-CM

## 2021-01-13 DIAGNOSIS — E118 Type 2 diabetes mellitus with unspecified complications: Secondary | ICD-10-CM | POA: Diagnosis not present

## 2021-01-13 DIAGNOSIS — J449 Chronic obstructive pulmonary disease, unspecified: Secondary | ICD-10-CM | POA: Diagnosis not present

## 2021-01-13 DIAGNOSIS — F17200 Nicotine dependence, unspecified, uncomplicated: Secondary | ICD-10-CM

## 2021-01-13 LAB — CBC WITH DIFFERENTIAL/PLATELET
Basophils Absolute: 0.1 10*3/uL (ref 0.0–0.1)
Basophils Relative: 1.2 % (ref 0.0–3.0)
Eosinophils Absolute: 0.1 10*3/uL (ref 0.0–0.7)
Eosinophils Relative: 1 % (ref 0.0–5.0)
HCT: 40.5 % (ref 39.0–52.0)
Hemoglobin: 12.7 g/dL — ABNORMAL LOW (ref 13.0–17.0)
Lymphocytes Relative: 22.8 % (ref 12.0–46.0)
Lymphs Abs: 1.5 10*3/uL (ref 0.7–4.0)
MCHC: 31.5 g/dL (ref 30.0–36.0)
MCV: 64.6 fl — ABNORMAL LOW (ref 78.0–100.0)
Monocytes Absolute: 0.5 10*3/uL (ref 0.1–1.0)
Monocytes Relative: 7.5 % (ref 3.0–12.0)
Neutro Abs: 4.5 10*3/uL (ref 1.4–7.7)
Neutrophils Relative %: 67.5 % (ref 43.0–77.0)
Platelets: 208 10*3/uL (ref 150.0–400.0)
RBC: 6.26 Mil/uL — ABNORMAL HIGH (ref 4.22–5.81)
RDW: 15.9 % — ABNORMAL HIGH (ref 11.5–15.5)
WBC: 6.7 10*3/uL (ref 4.0–10.5)

## 2021-01-13 MED ORDER — BUDESONIDE-FORMOTEROL FUMARATE 160-4.5 MCG/ACT IN AERO: 2.0000 | INHALATION_SPRAY | Freq: Two times a day (BID) | RESPIRATORY_TRACT | 12 refills | Status: DC

## 2021-01-13 NOTE — Progress Notes (Signed)
Subjective:    Patient ID: Alec [name], male    DOB: [birth year], 65 y.o.   MRN: [id number]  HPI  Pt in for follow up.   Recent a1c was 6.5. Pt reports mild noncompliance on meds and diet.   Pt has CAD. Recent lipid med has increased. 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 Pt is compliant on crestor 20 mg a day.   Pt aware of copd type findings. Cut back to 12 cig a day.he states does wheeze some dialy but minimal.  Pt had ct chest screening last year march 21.  Recent hemorrhoid on vacation reported resolved with proctofoam.    Review of Systems  Constitutional:  Negative for chills, fatigue and fever.  Respiratory:  Negative for chest tightness, shortness of breath and wheezing.   Cardiovascular:  Negative for chest pain and palpitations.  Gastrointestinal:  Negative for abdominal pain, blood in stool and nausea.  Endocrine: Negative for polydipsia, polyphagia and polyuria.  Genitourinary:  Negative for dysuria.  Musculoskeletal:  Negative for back pain, joint swelling, myalgias and neck stiffness.  Skin:  Negative for pallor and rash.  Neurological:  Negative for dizziness, seizures, light-headedness, numbness and headaches.  Hematological:  Negative for adenopathy. Does not bruise/bleed easily.  Psychiatric/Behavioral:  Negative for behavioral problems, confusion, dysphoric mood and hallucinations. The patient is not nervous/anxious.     Past Medical History:  Diagnosis Date   ALLERGIC RHINITIS    Allergy    Arthritis    Diabetes mellitus without complication (HCC)    type 2   DIVERTICULITIS, HX OF    GERD    LEG PAIN, LEFT    Prostate cancer ([place])    SINUSITIS, CHRONIC    SMOKER      Social History   Socioeconomic History   Marital status: Married    Spouse name: Not on file   Number of children: Not on file   Years of education: Not on file   Highest education level: Not on file  Occupational History    Occupation: Supervisor   Tobacco Use   Smoking status: Every Day    Packs/day: 0.75    Years: 40.00    Pack years: 30.00    Types: Cigarettes   Smokeless tobacco: Never   Tobacco comments:    trying to quit, from 1 1/2 ppd to 13-14/day  Vaping Use   Vaping Use: Never used  Substance and Sexual Activity   Alcohol use: No   Drug use: No   Sexual activity: Yes  Other Topics Concern   Not on file  Social History Narrative   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   Married, lives with wife (MM dx 2009)   Social Determinants of Health   Financial Resource Strain: Not on file  Food Insecurity: Not on file  Transportation Needs: Not on file  Physical Activity: Not on file  Stress: Not on file  Social Connections: Not on file  Intimate Partner Violence: Not on file    Past Surgical History:  Procedure Laterality Date   [place] N/A 09/20/2018   Procedure: RADIOACTIVE SEED IMPLANT/BRACHYTHERAPY IMPLANT;  Surgeon: Irine Seal, MD;  Location: [facility];  Service: Urology;  Laterality: N/A;   SPACE OAR INSTILLATION N/A 09/20/2018   Procedure: SPACE OAR INSTILLATION;  Surgeon: Irine Seal, MD;  Location: [facility];  Service: Urology;  Laterality: N/A;   SPINE SURGERY     lumbar surgery 2012.    Family History  Problem Relation Age of Onset   Alzheimer's disease Mother    Heart disease Father    Parkinsonism Father    Alcohol abuse Brother    Colon cancer Neg Hx    Esophageal cancer Neg Hx    Liver cancer Neg Hx    Pancreatic cancer Neg Hx    Rectal cancer Neg Hx    Stomach cancer Neg Hx     No Known Allergies  Current Outpatient Medications on File Prior to Visit  Medication Sig Dispense Refill   sildenafil (VIAGRA) 100 MG tablet Take 1/2 to 1 tablet by mouth at least 30 mins prior to intimacy 30 tablet PRN   glucose blood (ONETOUCH VERIO) test strip Use as instructed 100  each 12   hydrocortisone-pramoxine (PROCTOFOAM-HC) rectal foam Place 1 applicator rectally 2 (two) times daily. 10 g 0   levocetirizine (XYZAL) 5 MG tablet Take 1 tablet (5 mg total) by mouth every evening. 30 tablet 3   metFORMIN (GLUCOPHAGE) 500 MG tablet TAKE 1 TABLET BY MOUTH TWICE DAILY WITH A MEAL 60 tablet 0   metoprolol tartrate (LOPRESSOR) 100 MG tablet Take 100 mg 2 hours before CT 1 tablet 0   mupirocin ointment (BACTROBAN) 2 % Apply 1 application topically 2 (two) times daily. 30 g 0   ONETOUCH DELICA LANCETS 37T MISC Use as directed. 100 each 12   pramoxine-hydrocortisone (PROCTOCREAM-HC) 1-1 % rectal cream Place 1 application rectally 2 (two) times daily. 30 g 0   rosuvastatin (CRESTOR) 20 MG tablet Take 1 tablet (20 mg total) by mouth daily. 1 tab po q day 90 tablet 1   tamsulosin (FLOMAX) 0.4 MG CAPS capsule Take 1 capsule (0.4 mg total) by mouth 2 (two) times daily. 180 capsule 3   Current Facility-Administered Medications on File Prior to Visit  Medication Dose Route Frequency Provider Last Rate Last Admin   hydrocortisone-pramoxine (PROCTOFOAM-HC) rectal foam 1 applicator  1 applicator Rectal BID Enslie Sahota, PA-C        BP 117/68   Pulse 72   Resp 18   Ht 5\' 10"  (1.778 m)   Wt 183 lb (83 kg)   SpO2 98%   BMI 26.26 kg/m        Objective:   Physical Exam   General Mental Status- Alert. General Appearance- Not in acute distress.   Skin General: Color- Normal Color. Moisture- Normal Moisture.  Neck Carotid Arteries- Normal color. Moisture- Normal Moisture. No carotid bruits. No JVD.  Chest and Lung Exam Auscultation: Breath Sounds:-Normal.  Cardiovascular Auscultation:Rythm- Regular. Murmurs & Other Heart Sounds:Auscultation of the heart reveals- No Murmurs.  Abdomen Inspection:-Inspeection Normal. Palpation/Percussion:Note:No mass. Palpation and Percussion of the abdomen reveal- Non Tender, Non Distended + BS, no rebound or  guarding.  Neurologic Cranial Nerve exam:- CN III-XII intact(No nystagmus), symmetric smile. Strength:- 5/5 equal and symmetric strength both upper and lower extremities.      Assessment & Plan:  For copd, hx of smoking and intermittent wheezing, I prescribed symbicort. Also recommend cutting back further/stopping smoking.   For CAD and hx of diabetes continue current statin.   For hx of prosate CA continue to follow up regularly with urologist.  For recent hemorrhoids glad to hear your proctofoam worked.   Repeat screening ct chest placed.  Hx of mild anemia. Will get cbc today.  Follow up in 3 months or as needed.  Mackie Pai, PA-C

## 2021-01-13 NOTE — Patient Instructions (Addendum)
For copd, hx of smoking and intermittent wheezing, I prescribed symbicort. Also recommend cutting back further/stopping smoking.   For CAD and hx of diabetes continue current statin.   For hx of prosate CA continue to follow up regularly with urologist.  For recent hemorrhoids glad to hear your proctofoam worked.   Repeat screening ct chest placed.  For hx of anemia will get cbc today.  Follow up in 3 months or as needed.

## 2021-02-09 ENCOUNTER — Ambulatory Visit: Payer: 59 | Admitting: Urology

## 2021-02-16 ENCOUNTER — Other Ambulatory Visit: Payer: Self-pay | Admitting: Medical

## 2021-03-01 NOTE — Progress Notes (Signed)
History of Present Illness: 9.27.2019--he underwent ultrasound and biopsy of his prostate.  PSA 4.02, prostate size 30 mL, 2 cores revealed GS 3+3 pattern.  PSAD 0.13.   Because of his rising PSA and relatively high volume prostate cancer, he was referred for radiotherapy and underwent brachytherapy with SpaceOAR instillation on 3.12.2020.  .   8.23.2022: PSA in December 2021 0.6. No blood in urine/stool. IPSS 8 quality-of-life score 2.  Not on any medical therapy for his prostate.  He still uses Viagra.   Past Medical History:  Diagnosis Date   ALLERGIC RHINITIS    Allergy    Arthritis    Diabetes mellitus without complication (HCC)    type 2   DIVERTICULITIS, HX OF    GERD    LEG PAIN, LEFT    Prostate cancer ([place])    SINUSITIS, CHRONIC    SMOKER     Past Surgical History:  Procedure Laterality Date   FINGER SURGERY     PROSTATE BIOPSY     RADIOACTIVE SEED IMPLANT N/A 09/20/2018   Procedure: RADIOACTIVE SEED IMPLANT/BRACHYTHERAPY IMPLANT;  Surgeon: Irine Seal, MD;  Location: [facility];  Service: Urology;  Laterality: N/A;   SPACE OAR INSTILLATION N/A 09/20/2018   Procedure: SPACE OAR INSTILLATION;  Surgeon: Irine Seal, MD;  Location: [facility];  Service: Urology;  Laterality: N/A;   SPINE SURGERY     lumbar surgery 2012.    Home Medications:  Allergies as of 03/02/2021   No Known Allergies      Medication List        Accurate as of March 01, 2021  1:12 PM. If you have any questions, ask your nurse or doctor.          budesonide-formoterol 160-4.5 MCG/ACT inhaler Commonly known as: Symbicort Inhale 2 puffs into the lungs 2 (two) times daily.   glucose blood test strip Commonly known as: OneTouch Verio Use as instructed   hydrocortisone-pramoxine rectal foam Commonly known as: PROCTOFOAM-HC Place 1 applicator rectally 2 (two) times daily.   levocetirizine 5 MG tablet Commonly known as: XYZAL Take 1 tablet (5 mg  total) by mouth every evening.   metFORMIN 500 MG tablet Commonly known as: GLUCOPHAGE TAKE 1 TABLET BY MOUTH TWICE DAILY WITH A MEAL   metoprolol tartrate 100 MG tablet Commonly known as: LOPRESSOR Take 100 mg 2 hours before CT   mupirocin ointment 2 % Commonly known as: BACTROBAN Apply 1 application topically 2 (two) times daily.   OneTouch Delica Lancets 99991111 Misc Use as directed.   rosuvastatin 20 MG tablet Commonly known as: Crestor Take 1 tablet (20 mg total) by mouth daily. 1 tab po q day   sildenafil 100 MG tablet Commonly known as: VIAGRA Take 1/2 to 1 tablet by mouth at least 30 mins prior to intimacy   tamsulosin 0.4 MG Caps capsule Commonly known as: FLOMAX Take 1 capsule (0.4 mg total) by mouth 2 (two) times daily.        Allergies: No Known Allergies  Family History  Problem Relation Age of Onset   Alzheimer's disease Mother    Heart disease Father    Parkinsonism Father    Alcohol abuse Brother    Colon cancer Neg Hx    Esophageal cancer Neg Hx    Liver cancer Neg Hx    Pancreatic cancer Neg Hx    Rectal cancer Neg Hx    Stomach cancer Neg Hx     Social History:  reports that he has been smoking. He has a 30.00 pack-year smoking history. He has never used smokeless tobacco. He reports that he does not drink alcohol and does not use drugs.  ROS: A complete review of systems was performed.  All systems are negative except for pertinent findings as noted.  Physical Exam:  Vital signs in last 24 hours: There were no vitals taken for this visit. Constitutional:  Alert and oriented, No acute distress Cardiovascular: Regular rate  Respiratory: Normal respiratory effort GI: Abdomen is soft, nontender, nondistended, no abdominal masses. No CVAT.  No inguinal hernias Genitourinary: Normal uncircumcised male phallus, testes are descended bilaterally and non-tender and without masses, scrotum is normal in appearance without lesions or masses, perineum is  normal on inspection.  Prostate is smooth, flat. Lymphatic: No lymphadenopathy Neurologic: Grossly intact, no focal deficits Psychiatric: Normal mood and affect  I have reviewed prior pt notes  I have reviewed notes from referring/previous physicians  I have reviewed urinalysis results  I have reviewed prior PSA results--last PSA from December, 2021    Impression/Assessment:  1.  History of prostate cancer, GG 1, status post brachytherapy March, 2020.  Doing well so far  2.  History of hematuria with negative evaluation  3.  ED, on sildenafil  Plan:  1.  PSA is drawn today, I will have it checked before I see him again in 6 months  2.  Sildenafil refilled

## 2021-03-02 ENCOUNTER — Encounter: Payer: Self-pay | Admitting: Urology

## 2021-03-02 ENCOUNTER — Ambulatory Visit: Payer: BC Managed Care – PPO | Admitting: Urology

## 2021-03-02 ENCOUNTER — Other Ambulatory Visit: Payer: Self-pay

## 2021-03-02 VITALS — BP 111/74 | HR 91

## 2021-03-02 DIAGNOSIS — Z8546 Personal history of malignant neoplasm of prostate: Secondary | ICD-10-CM | POA: Diagnosis not present

## 2021-03-02 DIAGNOSIS — R3121 Asymptomatic microscopic hematuria: Secondary | ICD-10-CM

## 2021-03-02 DIAGNOSIS — N5201 Erectile dysfunction due to arterial insufficiency: Secondary | ICD-10-CM | POA: Diagnosis not present

## 2021-03-02 LAB — URINALYSIS, ROUTINE W REFLEX MICROSCOPIC
Bilirubin, UA: NEGATIVE
Glucose, UA: NEGATIVE
Ketones, UA: NEGATIVE
Leukocytes,UA: NEGATIVE
Nitrite, UA: NEGATIVE
Protein,UA: NEGATIVE
Specific Gravity, UA: 1.025 (ref 1.005–1.030)
Urobilinogen, Ur: 0.2 mg/dL (ref 0.2–1.0)
pH, UA: 5.5 (ref 5.0–7.5)

## 2021-03-02 LAB — MICROSCOPIC EXAMINATION
Bacteria, UA: NONE SEEN
Epithelial Cells (non renal): NONE SEEN /hpf (ref 0–10)
Renal Epithel, UA: NONE SEEN /hpf
WBC, UA: NONE SEEN /hpf (ref 0–5)

## 2021-03-02 MED ORDER — SILDENAFIL CITRATE 100 MG PO TABS: 100.0000 mg | ORAL_TABLET | Freq: Every day | ORAL | 99 refills | Status: DC | PRN

## 2021-03-02 NOTE — Progress Notes (Signed)

## 2021-03-03 LAB — PSA: Prostate Specific Ag, Serum: 0.7 ng/mL (ref 0.0–4.0)

## 2021-03-03 NOTE — Progress Notes (Signed)
Results sent via my chart 

## 2021-04-06 ENCOUNTER — Other Ambulatory Visit: Payer: Self-pay | Admitting: Urology

## 2021-04-06 DIAGNOSIS — N401 Enlarged prostate with lower urinary tract symptoms: Secondary | ICD-10-CM

## 2021-04-08 ENCOUNTER — Ambulatory Visit: Payer: BC Managed Care – PPO | Admitting: Medical

## 2021-04-09 ENCOUNTER — Other Ambulatory Visit: Payer: Self-pay

## 2021-04-09 ENCOUNTER — Ambulatory Visit (HOSPITAL_BASED_OUTPATIENT_CLINIC_OR_DEPARTMENT_OTHER)
Admission: RE | Admit: 2021-04-09 | Discharge: 2021-04-09 | Disposition: A | Discharge status: Home / Self Care | Payer: BC Managed Care – PPO | Source: Ambulatory Visit | Attending: Medical | Admitting: Medical

## 2021-04-09 DIAGNOSIS — J449 Chronic obstructive pulmonary disease, unspecified: Secondary | ICD-10-CM

## 2021-04-09 DIAGNOSIS — F17200 Nicotine dependence, unspecified, uncomplicated: Secondary | ICD-10-CM

## 2021-04-09 DIAGNOSIS — F17210 Nicotine dependence, cigarettes, uncomplicated: Secondary | ICD-10-CM | POA: Diagnosis not present

## 2021-04-09 NOTE — Addendum Note (Signed)
Addended by: Anabel Halon on: 04/09/2021 10:48 AM   Modules accepted: Orders

## 2021-04-15 ENCOUNTER — Telehealth: Payer: Self-pay | Admitting: *Deleted

## 2021-04-15 ENCOUNTER — Telehealth: Payer: Self-pay | Admitting: Medical

## 2021-04-15 DIAGNOSIS — D649 Anemia, unspecified: Secondary | ICD-10-CM

## 2021-04-15 NOTE — Telephone Encounter (Signed)
Thank you :)

## 2021-04-15 NOTE — Telephone Encounter (Signed)
Pt has lab appt at 8am tomorrow and I do not see any future orders in Epic.  Please place future orders if appropriate or call pt to cancel if labs not needed at this time.

## 2021-04-15 NOTE — Telephone Encounter (Signed)
Future lab placed  

## 2021-04-16 ENCOUNTER — Other Ambulatory Visit (INDEPENDENT_AMBULATORY_CARE_PROVIDER_SITE_OTHER): Payer: BC Managed Care – PPO

## 2021-04-16 ENCOUNTER — Other Ambulatory Visit: Payer: Self-pay

## 2021-04-16 DIAGNOSIS — D649 Anemia, unspecified: Secondary | ICD-10-CM | POA: Diagnosis not present

## 2021-04-16 LAB — CBC WITH DIFFERENTIAL/PLATELET
Basophils Absolute: 0.1 10*3/uL (ref 0.0–0.1)
Basophils Relative: 1.4 % (ref 0.0–3.0)
Eosinophils Absolute: 0.1 10*3/uL (ref 0.0–0.7)
Eosinophils Relative: 1.5 % (ref 0.0–5.0)
HCT: 37.2 % — ABNORMAL LOW (ref 39.0–52.0)
Hemoglobin: 11.5 g/dL — ABNORMAL LOW (ref 13.0–17.0)
Lymphocytes Relative: 22.3 % (ref 12.0–46.0)
Lymphs Abs: 1.8 10*3/uL (ref 0.7–4.0)
MCHC: 31 g/dL (ref 30.0–36.0)
MCV: 64.5 fl — ABNORMAL LOW (ref 78.0–100.0)
Monocytes Absolute: 0.6 10*3/uL (ref 0.1–1.0)
Monocytes Relative: 7.3 % (ref 3.0–12.0)
Neutro Abs: 5.3 10*3/uL (ref 1.4–7.7)
Neutrophils Relative %: 67.5 % (ref 43.0–77.0)
Platelets: 197 10*3/uL (ref 150.0–400.0)
RBC: 5.76 Mil/uL (ref 4.22–5.81)
RDW: 16.3 % — ABNORMAL HIGH (ref 11.5–15.5)
WBC: 7.9 10*3/uL (ref 4.0–10.5)

## 2021-04-16 LAB — IRON: Iron: 78 ug/dL (ref 42–165)

## 2021-04-22 ENCOUNTER — Ambulatory Visit: Payer: BC Managed Care – PPO | Admitting: Medical

## 2021-04-22 ENCOUNTER — Other Ambulatory Visit: Payer: Self-pay

## 2021-04-22 ENCOUNTER — Ambulatory Visit (HOSPITAL_BASED_OUTPATIENT_CLINIC_OR_DEPARTMENT_OTHER)
Admission: RE | Admit: 2021-04-22 | Discharge: 2021-04-22 | Disposition: A | Discharge status: Home / Self Care | Payer: BC Managed Care – PPO | Source: Ambulatory Visit | Attending: Medical | Admitting: Medical

## 2021-04-22 VITALS — BP 129/72 | HR 79 | Temp 98.2°F | Resp 18 | Ht 70.0 in | Wt 183.0 lb

## 2021-04-22 DIAGNOSIS — M25551 Pain in right hip: Secondary | ICD-10-CM

## 2021-04-22 DIAGNOSIS — R739 Hyperglycemia, unspecified: Secondary | ICD-10-CM | POA: Diagnosis not present

## 2021-04-22 DIAGNOSIS — M5440 Lumbago with sciatica, unspecified side: Secondary | ICD-10-CM | POA: Diagnosis not present

## 2021-04-22 DIAGNOSIS — F17200 Nicotine dependence, unspecified, uncomplicated: Secondary | ICD-10-CM

## 2021-04-22 DIAGNOSIS — J449 Chronic obstructive pulmonary disease, unspecified: Secondary | ICD-10-CM | POA: Diagnosis not present

## 2021-04-22 DIAGNOSIS — E782 Mixed hyperlipidemia: Secondary | ICD-10-CM

## 2021-04-22 DIAGNOSIS — M545 Low back pain, unspecified: Secondary | ICD-10-CM | POA: Diagnosis not present

## 2021-04-22 MED ORDER — METFORMIN HCL 500 MG PO TABS: 500.0000 mg | ORAL_TABLET | Freq: Two times a day (BID) | ORAL | 11 refills | Status: DC

## 2021-04-22 MED ORDER — TADALAFIL 10 MG PO TABS: 10.0000 mg | ORAL_TABLET | ORAL | 1 refills | Status: DC | PRN

## 2021-04-22 NOTE — Progress Notes (Signed)
Subjective:    Patient ID: Alec [name], male    DOB: [birth year], 65 y.o.   MRN: [id number]  HPI  Recent a1c was 6.3. Pt reports mild noncompliance on meds and diet.     Pt has CAD. Pt is compliant on crestor 20 mg a day.   Pt aware of copd type findings. Cut back to 12 cig a day.he states does wheeze some dialy but minimal.   Pt had ct chest screening 04-09-21. Pt aware of Mild diffuse bronchial wall thickening with mild centrilobular and paraseptal emphysema. States no noticeable wheezing or sob. Pt willing to see pulmonologist in the past.   I accidentally did not order pt cmp, lipid panel and a1c before this visit. He declines labs today. His labs are always very steady so he declines   Pt past 4-5 weeks has had low back pain. More to rt side si area. He has not used any meds. Pain is much subsided now but still present minimally. Pain was on movement. Some rt hip pain as well but less.   Review of Systems  Constitutional:  Negative for chills, fatigue and fever.  HENT:  Negative for congestion, drooling and ear discharge.   Respiratory:  Negative for cough, chest tightness, shortness of breath and wheezing.   Cardiovascular:  Negative for chest pain and palpitations.  Gastrointestinal:  Negative for abdominal pain, blood in stool, diarrhea and nausea.  Genitourinary:  Negative for dysuria.       ED. Not much improved with viagra use.  Musculoskeletal:  Negative for back pain and joint swelling.  Skin:  Negative for rash.  Neurological:  Negative for dizziness, seizures, syncope, speech difficulty, weakness and light-headedness.  Hematological:  Negative for adenopathy. Does not bruise/bleed easily.  Psychiatric/Behavioral:  Negative for behavioral problems and confusion. The patient is not nervous/anxious.     Past Medical History:  Diagnosis Date   ALLERGIC RHINITIS    Allergy    Arthritis    Diabetes mellitus without complication (HCC)    type 2    DIVERTICULITIS, HX OF    GERD    LEG PAIN, LEFT    Prostate cancer (HCC)    SINUSITIS, CHRONIC    SMOKER      Social History   Socioeconomic History   Marital status: Married    Spouse name: Not on file   Number of children: Not on file   Years of education: Not on file   Highest education level: Not on file  Occupational History   Occupation: Supervisor   Tobacco Use   Smoking status: Every Day    Packs/day: 0.75    Years: 40.00    Pack years: 30.00    Types: Cigarettes   Smokeless tobacco: Never   Tobacco comments:    trying to quit, from 1 1/2 ppd to 13-14/day  Vaping Use   Vaping Use: Never used  Substance and Sexual Activity   Alcohol use: No   Drug use: No   Sexual activity: Yes  Other Topics Concern   Not on file  Social History Narrative   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   Married, lives with wife (MM dx 2009)   Social Determinants of Health   Financial Resource Strain: Not on file  Food Insecurity: Not on file  Transportation Needs: Not on file  Physical Activity: Not on file  Stress: Not on file  Social Connections: Not on file  Intimate Partner Violence: Not on file  Past Surgical History:  Procedure Laterality Date   FINGER SURGERY     PROSTATE BIOPSY     RADIOACTIVE SEED IMPLANT N/A 09/20/2018   Procedure: RADIOACTIVE SEED IMPLANT/BRACHYTHERAPY IMPLANT;  Surgeon: Irine Seal, MD;  Location: [facility];  Service: Urology;  Laterality: N/A;   SPACE OAR INSTILLATION N/A 09/20/2018   Procedure: SPACE OAR INSTILLATION;  Surgeon: Irine Seal, MD;  Location: [facility];  Service: Urology;  Laterality: N/A;   SPINE SURGERY     lumbar surgery 2012.    Family History  Problem Relation Age of Onset   Alzheimer's disease Mother    Heart disease Father    Parkinsonism Father    Alcohol abuse Brother    Colon cancer Neg Hx    Esophageal cancer Neg Hx    Liver cancer Neg Hx    Pancreatic cancer Neg Hx     Rectal cancer Neg Hx    Stomach cancer Neg Hx     No Known Allergies  Current Outpatient Medications on File Prior to Visit  Medication Sig Dispense Refill   budesonide-formoterol (SYMBICORT) 160-4.5 MCG/ACT inhaler Inhale 2 puffs into the lungs 2 (two) times daily. 1 each 12   glucose blood (ONETOUCH VERIO) test strip Use as instructed 100 each 12   hydrocortisone-pramoxine (PROCTOFOAM-HC) rectal foam Place 1 applicator rectally 2 (two) times daily. 10 g 0   levocetirizine (XYZAL) 5 MG tablet Take 1 tablet (5 mg total) by mouth every evening. 30 tablet 3   metFORMIN (GLUCOPHAGE) 500 MG tablet TAKE 1 TABLET BY MOUTH TWICE DAILY WITH A MEAL 60 tablet 0   metoprolol tartrate (LOPRESSOR) 100 MG tablet Take 100 mg 2 hours before CT 1 tablet 0   mupirocin ointment (BACTROBAN) 2 % Apply 1 application topically 2 (two) times daily. 30 g 0   ONETOUCH DELICA LANCETS 40J MISC Use as directed. 100 each 12   rosuvastatin (CRESTOR) 20 MG tablet Take 1 tablet (20 mg total) by mouth daily. 1 tab po q day 90 tablet 1   sildenafil (VIAGRA) 100 MG tablet Take 1/2 to 1 tablet by mouth at least 30 mins prior to intimacy 30 tablet PRN   tamsulosin (FLOMAX) 0.4 MG CAPS capsule Take 1 capsule by mouth twice daily 60 capsule 0   sildenafil (VIAGRA) 100 MG tablet Take 1 tablet (100 mg total) by mouth daily as needed for erectile dysfunction. 20 tablet prn   Current Facility-Administered Medications on File Prior to Visit  Medication Dose Route Frequency Provider Last Rate Last Admin   hydrocortisone-pramoxine (PROCTOFOAM-HC) rectal foam 1 applicator  1 applicator Rectal BID Tryton Bodi, PA-C        BP 129/72   Pulse 79   Temp 98.2 F (36.8 C)   Resp 18   Ht 5\' 10"  (1.778 m)   Wt 183 lb (83 kg)   SpO2 99%   BMI 26.26 kg/m        Objective:   Physical Exam  General Mental Status- Alert. General Appearance- Not in acute distress.   Skin General: Color- Normal Color. Moisture- Normal  Moisture.  Neck Carotid Arteries- Normal color. Moisture- Normal Moisture. No carotid bruits. No JVD.  Chest and Lung Exam Auscultation: Breath Sounds:-Normal.  Cardiovascular Auscultation:Rythm- Regular. Murmurs & Other Heart Sounds:Auscultation of the heart reveals- No Murmurs.  Abdomen Inspection:-Inspeection Normal. Palpation/Percussion:Note:No mass. Palpation and Percussion of the abdomen reveal- Non Tender, Non Distended + BS, no rebound or guarding.   Neurologic Cranial Nerve  exam:- CN III-XII intact(No nystagmus), symmetric smile. Strength:- 5/5 equal and symmetric strength both upper and lower extremities.   Back- moderate rt si area tender but no mid lumbar pain presently. Rt hip- mild pain on range of motion.      Assessment & Plan:   Patient Instructions  For high cholesterol well controlled except good cholesterol low. Continue crestor as known CAD on CT.  For elevated sugar/well controlled diabetes refill metformin. A1c tightly controlled historically.  High cholesterol but well controlled. Continue crestor.  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 Placed pulmonologist referral.  Hx of low back and sciatica. Prior surgery in past. Recent pain flared down. Decided to go ahead and get lumbar spine xray.   Again encourage to stop smoking.  Placing future labs cmp, a1c and lipid panel. Can schedule now or call back to get studies done one week before follow up.  For ED consider trial of cialis. Can get from urologist or message me if you decide you want to try.  Follow up in about 3.5 months or as needed.   Mackie Pai, PA-C    Time spent with patient today was 37  minutes which consisted of chart review, discussing diagnosis, work up, treatment and documentation.

## 2021-04-22 NOTE — Patient Instructions (Addendum)
For high cholesterol well controlled except good cholesterol low. Continue crestor as known CAD on CT.  For elevated sugar/well controlled diabetes refill metformin. A1c tightly controlled historically.  High cholesterol but well controlled. Continue crestor.  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 Placed pulmonologist referral.  Hx of low back and sciatica. Prior surgery in past. Recent pain flared down. Decided to go ahead and get lumbar spine xray.    Also get rt hip xray for recent mild hip pain.  Again encourage to stop smoking.  Placing future labs cmp, a1c and lipid panel. Can schedule now or call back to get studies done one week before follow up.  For ED consider trial of cialis. Can get from urologist or message me if you decide you want to try. Did rx cialis after discussion. Rx advisement. Dc viagra.  Follow up in about 3.5 months or as needed.

## 2021-06-25 DIAGNOSIS — Z20822 Contact with and (suspected) exposure to covid-19: Secondary | ICD-10-CM | POA: Diagnosis not present

## 2021-06-25 DIAGNOSIS — R509 Fever, unspecified: Secondary | ICD-10-CM | POA: Diagnosis not present

## 2021-06-25 DIAGNOSIS — M791 Myalgia, unspecified site: Secondary | ICD-10-CM | POA: Diagnosis not present

## 2021-06-26 ENCOUNTER — Telehealth: Payer: BC Managed Care – PPO | Admitting: Nurse Practitioner

## 2021-06-26 DIAGNOSIS — J069 Acute upper respiratory infection, unspecified: Secondary | ICD-10-CM

## 2021-06-26 MED ORDER — FLUTICASONE PROPIONATE 50 MCG/ACT NA SUSP: 2.0000 | Freq: Every day | NASAL | 6 refills | Status: DC

## 2021-06-26 MED ORDER — BENZONATATE 100 MG PO CAPS: 100.0000 mg | ORAL_CAPSULE | Freq: Three times a day (TID) | ORAL | 0 refills | Status: DC | PRN

## 2021-06-26 NOTE — Progress Notes (Signed)
E-Visit for Upper Respiratory Infection   We are sorry you are not feeling well.  Here is how we plan to help!  Based on what you have shared with me, it looks like you may have a viral upper respiratory infection.  Upper respiratory infections are caused by a large number of viruses; however, rhinovirus is the most common cause.   Symptoms vary from person to person, with common symptoms including sore throat, cough, fatigue or lack of energy and feeling of general discomfort.  A low-grade fever of up to 100.4 may present, but is often uncommon.  Symptoms vary however, and are closely related to a person's age or underlying illnesses.  The most common symptoms associated with an upper respiratory infection are nasal discharge or congestion, cough, sneezing, headache and pressure in the ears and face.  These symptoms usually persist for about 3 to 10 days, but can last up to 2 weeks.  It is important to know that upper respiratory infections do not cause serious illness or complications in most cases.    Upper respiratory infections can be transmitted from person to person, with the most common method of transmission being a person's hands.  The virus is able to live on the skin and can infect other persons for up to 2 hours after direct contact.  Also, these can be transmitted when someone coughs or sneezes; thus, it is important to cover the mouth to reduce this risk.  To keep the spread of the illness at [place], good hand hygiene is very important.  This is an infection that is most likely caused by a virus. There are no specific treatments other than to help you with the symptoms until the infection runs its course.  We are sorry you are not feeling well.  Here is how we plan to help!   For nasal congestion, you may use an oral decongestants such as Mucinex D or if you have glaucoma or high blood pressure use plain Mucinex.  Saline nasal spray or nasal drops can help and can safely be used as often as  needed for congestion.  For your congestion, I have prescribed Fluticasone nasal spray one spray in each nostril twice a day tis will help with post nasal drip as well as stopped up ears.  If you do not have a history of heart disease, hypertension, diabetes or thyroid disease, prostate/bladder issues or glaucoma, you may also use Sudafed to treat nasal congestion.  It is highly recommended that you consult with a pharmacist or your primary care physician to ensure this medication is safe for you to take.     If you have a cough, you may use cough suppressants such as Delsym and Robitussin.  If you have glaucoma or high blood pressure, you can also use Coricidin HBP.   For cough I have prescribed for you A prescription cough medication called Tessalon Perles 100 mg. You may take 1-2 capsules every 8 hours as needed for cough  If you have a sore or scratchy throat, use a saltwater gargle-  to  teaspoon of salt dissolved in a 4-ounce to 8-ounce glass of warm water.  Gargle the solution for approximately 15-30 seconds and then spit.  It is important not to swallow the solution.  You can also use throat lozenges/cough drops and Chloraseptic spray to help with throat pain or discomfort.  Warm or cold liquids can also be helpful in relieving throat pain.  For headache, pain or general discomfort,  you can use Ibuprofen or Tylenol as directed.   Some authorities believe that zinc sprays or the use of Echinacea may shorten the course of your symptoms.   HOME CARE Only take medications as instructed by your medical team. Be sure to drink plenty of fluids. Water is fine as well as fruit juices, sodas and electrolyte beverages. You may want to stay away from caffeine or alcohol. If you are nauseated, try taking small sips of liquids. How do you know if you are getting enough fluid? Your urine should be a pale yellow or almost colorless. Get rest. Taking a steamy shower or using a humidifier may help nasal  congestion and ease sore throat pain. You can place a towel over your head and breathe in the steam from hot water coming from a faucet. Using a saline nasal spray works much the same way. Cough drops, hard candies and sore throat lozenges may ease your cough. Avoid close contacts especially the very young and the elderly Cover your mouth if you cough or sneeze Always remember to wash your hands.   GET HELP RIGHT AWAY IF: You develop worsening fever. If your symptoms do not improve within 10 days You develop yellow or green discharge from your nose over 3 days. You have coughing fits You develop a severe head ache or visual changes. You develop shortness of breath, difficulty breathing or start having chest pain Your symptoms persist after you have completed your treatment plan  MAKE SURE YOU  Understand these instructions. Will watch your condition. Will get help right away if you are not doing well or get worse.  Thank you for choosing an e-visit.  Your e-visit answers were reviewed by a board certified advanced clinical practitioner to complete your personal care plan. Depending upon the condition, your plan could have included both over the counter or prescription medications.  Please review your pharmacy choice. Make sure the pharmacy is open so you can pick up prescription now. If there is a problem, you may contact your provider through [employer] and have the prescription routed to another pharmacy.  Your safety is important to [place]. If you have drug allergies check your prescription carefully.   For the next 24 hours you can use MyChart to ask questions about today's visit, request a non-urgent call back, or ask for a work or school excuse. You will get an email in the next two days asking about your experience. I hope that your e-visit has been valuable and will speed your recovery.    5-10 minutes spent reviewing and documenting in chart.

## 2021-07-24 ENCOUNTER — Other Ambulatory Visit: Payer: Self-pay | Admitting: Urology

## 2021-07-24 DIAGNOSIS — N401 Enlarged prostate with lower urinary tract symptoms: Secondary | ICD-10-CM

## 2021-08-24 ENCOUNTER — Other Ambulatory Visit: Payer: Medicare Other

## 2021-08-24 ENCOUNTER — Other Ambulatory Visit: Payer: Self-pay

## 2021-08-24 DIAGNOSIS — Z8546 Personal history of malignant neoplasm of prostate: Secondary | ICD-10-CM | POA: Diagnosis not present

## 2021-08-25 LAB — PSA: Prostate Specific Ag, Serum: 0.3 ng/mL (ref 0.0–4.0)

## 2021-08-30 ENCOUNTER — Ambulatory Visit: Payer: Self-pay | Admitting: Physician Assistant

## 2021-08-31 ENCOUNTER — Ambulatory Visit: Payer: BC Managed Care – PPO | Admitting: Urology

## 2021-09-30 DIAGNOSIS — H524 Presbyopia: Secondary | ICD-10-CM | POA: Diagnosis not present

## 2021-09-30 DIAGNOSIS — H2513 Age-related nuclear cataract, bilateral: Secondary | ICD-10-CM | POA: Diagnosis not present

## 2021-09-30 DIAGNOSIS — E119 Type 2 diabetes mellitus without complications: Secondary | ICD-10-CM | POA: Diagnosis not present

## 2021-10-05 ENCOUNTER — Ambulatory Visit: Payer: Self-pay | Admitting: Urology

## 2021-10-12 ENCOUNTER — Other Ambulatory Visit: Payer: Self-pay | Admitting: *Deleted

## 2021-10-12 MED ORDER — ROSUVASTATIN CALCIUM 20 MG PO TABS: 20.0000 mg | ORAL_TABLET | Freq: Every day | ORAL | 0 refills | Status: DC

## 2021-10-13 ENCOUNTER — Other Ambulatory Visit (INDEPENDENT_AMBULATORY_CARE_PROVIDER_SITE_OTHER): Payer: Medicare Other

## 2021-10-13 ENCOUNTER — Encounter: Payer: Medicare Other | Admitting: Medical

## 2021-10-13 DIAGNOSIS — E782 Mixed hyperlipidemia: Secondary | ICD-10-CM

## 2021-10-13 DIAGNOSIS — R739 Hyperglycemia, unspecified: Secondary | ICD-10-CM | POA: Diagnosis not present

## 2021-10-14 LAB — COMPREHENSIVE METABOLIC PANEL
ALT: 22 U/L (ref 0–53)
AST: 20 U/L (ref 0–37)
Albumin: 4.7 g/dL (ref 3.5–5.2)
Alkaline Phosphatase: 65 U/L (ref 39–117)
BUN: 20 mg/dL (ref 6–23)
CO2: 30 mEq/L (ref 19–32)
Calcium: 9.4 mg/dL (ref 8.4–10.5)
Chloride: 99 mEq/L (ref 96–112)
Creatinine, Ser: 1 mg/dL (ref 0.40–1.50)
GFR: 79.04 mL/min (ref >60.00)
Glucose, Bld: 106 mg/dL — ABNORMAL HIGH (ref 70–99)
Potassium: 4.4 mEq/L (ref 3.5–5.1)
Sodium: 137 mEq/L (ref 135–145)
Total Bilirubin: 1.1 mg/dL (ref 0.2–1.2)
Total Protein: 6.6 g/dL (ref 6.0–8.3)

## 2021-10-14 LAB — LIPID PANEL
Cholesterol: 113 mg/dL (ref 0–200)
HDL: 35.7 mg/dL — ABNORMAL LOW (ref >39.00)
LDL Cholesterol: 42 mg/dL (ref 0–99)
NonHDL: 77.41
Total CHOL/HDL Ratio: 3
Triglycerides: 175 mg/dL — ABNORMAL HIGH (ref 0.0–149.0)
VLDL: 35 mg/dL (ref 0.0–40.0)

## 2021-10-14 LAB — HEMOGLOBIN A1C: Hgb A1c MFr Bld: 8.4 % — ABNORMAL HIGH (ref 4.6–6.5)

## 2021-10-19 ENCOUNTER — Ambulatory Visit: Payer: Medicare Other | Admitting: Medical

## 2021-10-22 ENCOUNTER — Ambulatory Visit: Payer: Medicare Other | Admitting: Medical

## 2021-10-25 NOTE — Progress Notes (Incomplete)
History of Present Illness: Here for followup after curative therapy for PCa. ? ?9.27.2019--he underwent ultrasound and biopsy of his prostate.  PSA 4.02, prostate size 30 mL, 2 cores revealed GS 3+3 pattern.  PSAD 0.13. ?  ?Because of his rising PSA and relatively high volume prostate cancer, he was referred for radiotherapy and underwent brachytherapy with SpaceOAR instillation on 3.12.2020.  . ? ?Past Medical History:  ?Diagnosis Date  ? ALLERGIC RHINITIS   ? Allergy   ? Arthritis   ? Diabetes mellitus without complication ([place])   ? type 2  ? DIVERTICULITIS, HX OF   ? GERD   ? LEG PAIN, LEFT   ? Prostate cancer ([place])   ? SINUSITIS, CHRONIC   ? SMOKER   ? ? ?Past Surgical History:  ?Procedure Laterality Date  ? FINGER SURGERY    ? PROSTATE BIOPSY    ? RADIOACTIVE SEED IMPLANT N/A 09/20/2018  ? Procedure: RADIOACTIVE SEED IMPLANT/BRACHYTHERAPY IMPLANT;  Surgeon: Irine Seal, MD;  Location: [facility];  Service: Urology;  Laterality: N/A;  ? SPACE OAR INSTILLATION N/A 09/20/2018  ? Procedure: SPACE OAR INSTILLATION;  Surgeon: Irine Seal, MD;  Location: [facility];  Service: Urology;  Laterality: N/A;  ? SPINE SURGERY    ? lumbar surgery 2012.  ? ? ?Home Medications:  ?Allergies as of 10/26/2021   ?No Known Allergies ?  ? ?  ?Medication List  ?  ? ?  ? Accurate as of October 25, 2021 12:59 PM. If you have any questions, ask your nurse or doctor.  ?  ?  ? ?  ? ?benzonatate 100 MG capsule ?Commonly known as: Best boy ?Take 1 capsule (100 mg total) by mouth 3 (three) times daily as needed. ?  ?budesonide-formoterol 160-4.5 MCG/ACT inhaler ?Commonly known as: Symbicort ?Inhale 2 puffs into the lungs 2 (two) times daily. ?  ?fluticasone 50 MCG/ACT nasal spray ?Commonly known as: FLONASE ?Place 2 sprays into both nostrils daily. ?  ?glucose blood test strip ?Commonly known as: OneTouch Verio ?Use as instructed ?  ?hydrocortisone-pramoxine rectal foam ?Commonly known as:  PROCTOFOAM-HC ?Place 1 applicator rectally 2 (two) times daily. ?  ?levocetirizine 5 MG tablet ?Commonly known as: XYZAL ?Take 1 tablet (5 mg total) by mouth every evening. ?  ?metFORMIN 500 MG tablet ?Commonly known as: GLUCOPHAGE ?Take 1 tablet (500 mg total) by mouth 2 (two) times daily with a meal. ?  ?metoprolol tartrate 100 MG tablet ?Commonly known as: LOPRESSOR ?Take 100 mg 2 hours before CT ?  ?mupirocin ointment 2 % ?Commonly known as: BACTROBAN ?Apply 1 application topically 2 (two) times daily. ?  ?OneTouch Delica Lancets 70Y Misc ?Use as directed. ?  ?rosuvastatin 20 MG tablet ?Commonly known as: Crestor ?Take 1 tablet (20 mg total) by mouth daily. 1 tab po q day ?  ?tadalafil 10 MG tablet ?Commonly known as: CIALIS ?Take 1 tablet (10 mg total) by mouth every other day as needed for erectile dysfunction. ?  ?tamsulosin 0.4 MG Caps capsule ?Commonly known as: FLOMAX ?Take 1 capsule by mouth twice daily ?  ? ?  ? ? ?Allergies: No Known Allergies ? ?Family History  ?Problem Relation Age of Onset  ? Alzheimer's disease Mother   ? Heart disease Father   ? Parkinsonism Father   ? Alcohol abuse Brother   ? Colon cancer Neg Hx   ? Esophageal cancer Neg Hx   ? Liver cancer Neg Hx   ? Pancreatic cancer Neg Hx   ?  Rectal cancer Neg Hx   ? Stomach cancer Neg Hx   ? ? ?Social History:  reports that he has been smoking. He has a 30.00 pack-year smoking history. He has never used smokeless tobacco. He reports that he does not drink alcohol and does not use drugs. ? ?ROS: ?A complete review of systems was performed.  All systems are negative except for pertinent findings as noted. ? ?Physical Exam:  ?Vital signs in last 24 hours: ?There were no vitals taken for this visit. ?Constitutional:  Alert and oriented, No acute distress ?Cardiovascular: Regular rate  ?Respiratory: Normal respiratory effort ?GI: Abdomen is soft, nontender, nondistended, no abdominal masses. No CVAT.  ?Genitourinary: Normal male phallus, testes  are descended bilaterally and non-tender and without masses, scrotum is normal in appearance without lesions or masses, perineum is normal on inspection. ?Lymphatic: No lymphadenopathy ?Neurologic: Grossly intact, no focal deficits ?Psychiatric: Normal mood and affect ? ?I have reviewed prior pt notes ? ?I have reviewed notes from referring/previous physicians ? ?I have reviewed urinalysis results ? ?I have independently reviewed prior imaging ? ?I have reviewed prior PSA results ? ?I have reviewed prior urine culture ? ? ?Impression/Assessment:  ?*** ? ?Plan:  ?*** ? ?

## 2021-10-26 ENCOUNTER — Ambulatory Visit: Payer: Self-pay | Admitting: Urology

## 2021-10-26 DIAGNOSIS — N5201 Erectile dysfunction due to arterial insufficiency: Secondary | ICD-10-CM

## 2021-10-26 DIAGNOSIS — Z8546 Personal history of malignant neoplasm of prostate: Secondary | ICD-10-CM

## 2021-11-05 ENCOUNTER — Other Ambulatory Visit: Payer: Self-pay | Admitting: Urology

## 2021-11-05 DIAGNOSIS — N401 Enlarged prostate with lower urinary tract symptoms: Secondary | ICD-10-CM

## 2021-11-16 ENCOUNTER — Encounter: Payer: Self-pay | Admitting: Emergency Medicine

## 2021-11-16 ENCOUNTER — Ambulatory Visit
Admission: EM | Admit: 2021-11-16 | Discharge: 2021-11-16 | Disposition: A | Discharge status: Home / Self Care | Payer: Medicare Other | Attending: Nurse Practitioner | Admitting: Nurse Practitioner

## 2021-11-16 DIAGNOSIS — R1033 Periumbilical pain: Secondary | ICD-10-CM | POA: Diagnosis not present

## 2021-11-16 LAB — POCT URINALYSIS DIP (MANUAL ENTRY)
Bilirubin, UA: NEGATIVE
Glucose, UA: NEGATIVE mg/dL
Leukocytes, UA: NEGATIVE
Nitrite, UA: NEGATIVE
Protein Ur, POC: 30 mg/dL — AB
Spec Grav, UA: 1.025 (ref 1.010–1.025)
Urobilinogen, UA: 1 E.U./dL
pH, UA: 5.5 (ref 5.0–8.0)

## 2021-11-16 MED ORDER — CIPROFLOXACIN HCL 500 MG PO TABS: 500.0000 mg | ORAL_TABLET | Freq: Two times a day (BID) | ORAL | 0 refills | Status: AC

## 2021-11-16 MED ORDER — METRONIDAZOLE 500 MG PO TABS
500.0000 mg | ORAL_TABLET | Freq: Two times a day (BID) | ORAL | 0 refills | Status: AC
Start: 2021-11-16 — End: 2021-11-23

## 2021-11-16 NOTE — ED Triage Notes (Signed)
Lower ABD pain x 1 week with diarrhea.  Hx of diverticulitis.   ?

## 2021-11-16 NOTE — ED Provider Notes (Signed)
?[facility] ? ? ? ?CSN: [id number] ?Arrival date & time: 11/16/21  0806 ? ? ?  ? ?History   ?Chief Complaint ?No chief complaint on file. ? ? ?HPI ?Alec [name] is a 66 y.o. male.  ? ?The patient is a 66 year old male who presents with abdominal pain and diarrhea.  Symptoms have been present for the past week.  Patient reports a history of diverticulitis.  Patient states "I know this is what it is because I have had it some many times".  He also reports states he is having approximately 5 diarrhea stools per day since his symptoms started.  He denies fever, chills, or urinary symptoms.  He cannot recall when he was last treated for diverticulitis, thinks it was a year or so ago.  He also has a history of prostate cancer that he received radiation for in 2020. ? ?The history is provided by the patient.  ? ?Past Medical History:  ?Diagnosis Date  ? ALLERGIC RHINITIS   ? Allergy   ? Arthritis   ? Diabetes mellitus without complication ([place])   ? type 2  ? DIVERTICULITIS, HX OF   ? GERD   ? LEG PAIN, LEFT   ? Prostate cancer ([place])   ? SINUSITIS, CHRONIC   ? SMOKER   ? ? ?Patient Active Problem List  ? Diagnosis Date Noted  ? Malignant neoplasm of prostate ([place]) 07/08/2018  ? 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/10/2014  ? Numbness of finger 10/10/2014  ? Diabetes ([place]) 07/09/2014  ? Wellness examination 06/02/2014  ? Anemia 05/06/2014  ? Pain in joint, forearm 05/01/2014  ? Pain in joint, upper arm 05/01/2014  ? Back pain 05/01/2014  ? Pain in the abdomen 05/01/2014  ? Needs smoking cessation education 05/01/2014  ? SMOKER 05/20/2009  ? SINUSITIS, CHRONIC 05/20/2009  ? ALLERGIC RHINITIS 05/20/2009  ? GERD 05/20/2009  ? LEG PAIN, LEFT 05/20/2009  ? COLONIC POLYPS, HX OF 05/20/2009  ? DIVERTICULITIS, HX OF 05/20/2009  ? ? ?Past Surgical History:  ?Procedure Laterality Date  ? FINGER SURGERY    ? PROSTATE BIOPSY    ? RADIOACTIVE SEED IMPLANT N/A 09/20/2018  ? Procedure: RADIOACTIVE SEED IMPLANT/BRACHYTHERAPY IMPLANT;  Surgeon:  Irine Seal, MD;  Location: [facility];  Service: Urology;  Laterality: N/A;  ? SPACE OAR INSTILLATION N/A 09/20/2018  ? Procedure: SPACE OAR INSTILLATION;  Surgeon: Irine Seal, MD;  Location[facility];  Service: Urology;  Laterality: N/A;  ? SPINE SURGERY    ? lumbar surgery 2012.  ? ? ? ? ? ?Home Medications   ? ?Prior to Admission medications   ?Medication Sig Start Date End Date Taking? Authorizing Provider  ?ciprofloxacin (CIPRO) 500 MG tablet Take 1 tablet (500 mg total) by mouth every 12 (twelve) hours for 5 days. 11/16/21 11/21/21 Yes Jovanna Hodges-Warren, Alda Lea, NP  ?metroNIDAZOLE (FLAGYL) 500 MG tablet Take 1 tablet (500 mg total) by mouth 2 (two) times daily for 7 days. 11/16/21 11/23/21 Yes Vinod Mikesell-Warren, Alda Lea, NP  ?benzonatate (TESSALON PERLES) 100 MG capsule Take 1 capsule (100 mg total) by mouth 3 (three) times daily as needed. 06/26/21   Chevis Pretty, FNP  ?budesonide-formoterol (SYMBICORT) 160-4.5 MCG/ACT inhaler Inhale 2 puffs into the lungs 2 (two) times daily. 01/13/21   Saguier, Percell Miller, PA-C  ?fluticasone (FLONASE) 50 MCG/ACT nasal spray Place 2 sprays into both nostrils daily. 06/26/21   Chevis Pretty, FNP  ?glucose blood (ONETOUCH VERIO) test strip Use as instructed 08/13/14   Saguier,  Percell Miller, PA-C  ?hydrocortisone-pramoxine [facility]) rectal foam Place 1 applicator rectally 2 (two) times daily. 11/25/19   Saguier, Percell Miller, PA-C  ?levocetirizine (XYZAL) 5 MG tablet Take 1 tablet (5 mg total) by mouth every evening. 12/10/19   Saguier, Percell Miller, PA-C  ?metFORMIN (GLUCOPHAGE) 500 MG tablet Take 1 tablet (500 mg total) by mouth 2 (two) times daily with a meal. 04/22/21   Saguier, Percell Miller, PA-C  ?metoprolol tartrate (LOPRESSOR) 100 MG tablet Take 100 mg 2 hours before CT 10/25/19   Herminio Commons, MD  ?mupirocin ointment (BACTROBAN) 2 % Apply 1 application topically 2 (two) times daily. 02/24/20   Lestine Box, PA-C  ?ONETOUCH DELICA LANCETS 70W MISC  Use as directed. 07/30/14   Saguier, Percell Miller, PA-C  ?rosuvastatin (CRESTOR) 20 MG tablet Take 1 tablet (20 mg total) by mouth daily. 1 tab po q day 10/12/21   Ahmed Prima, Fransisco Hertz, PA-C  ?tadalafil (CIALIS) 10 MG tablet Take 1 tablet (10 mg total) by mouth every other day as needed for erectile dysfunction. 04/22/21   Saguier, Percell Miller, PA-C  ?tamsulosin (FLOMAX) 0.4 MG CAPS capsule Take 1 capsule by mouth twice daily 11/09/21   Franchot Gallo, MD  ? ? ?Family History ?Family History  ?Problem Relation Age of Onset  ? Alzheimer's disease Mother   ? Heart disease Father   ? Parkinsonism Father   ? Alcohol abuse Brother   ? Colon cancer Neg Hx   ? Esophageal cancer Neg Hx   ? Liver cancer Neg Hx   ? Pancreatic cancer Neg Hx   ? Rectal cancer Neg Hx   ? Stomach cancer Neg Hx   ? ? ?Social History ?Social History  ? ?Tobacco Use  ? Smoking status: Every Day  ?  Packs/day: 0.75  ?  Years: 40.00  ?  Pack years: 30.00  ?  Types: Cigarettes  ? Smokeless tobacco: Never  ? Tobacco comments:  ?  trying to quit, from 1 1/2 ppd to 13-14/day  ?Vaping Use  ? Vaping Use: Never used  ?Substance Use Topics  ? Alcohol use: No  ? Drug use: No  ? ? ? ?Allergies   ?Patient has no known allergies. ? ? ?Review of Systems ?Review of Systems  ?Constitutional: Negative.   ?Gastrointestinal:  Positive for abdominal pain and diarrhea. Negative for nausea and vomiting.  ?Skin: Negative.   ?Psychiatric/Behavioral: Negative.    ? ? ?Physical Exam ?Triage Vital Signs ?ED Triage Vitals  ?Enc Vitals Group  ?   BP 11/16/21 0814 125/76  ?   Pulse Rate 11/16/21 0814 89  ?   Resp 11/16/21 0814 18  ?   Temp 11/16/21 0814 97.9 ?F (36.6 ?C)  ?   Temp Source 11/16/21 0814 Oral  ?   SpO2 11/16/21 0814 98 %  ?   Weight --   ?   Height --   ?   Head Circumference --   ?   Peak Flow --   ?   Pain Score 11/16/21 0815 4  ?   Pain Loc --   ?   Pain Edu? --   ?   Excl. in [place]? --   ? ?No data found. ? ?Updated Vital Signs ?BP 125/76 (BP Location: Right Arm)   Pulse 89    Temp 97.9 ?F (36.6 ?C) (Oral)   Resp 18   SpO2 98%  ? ?Visual Acuity ?Right Eye Distance:   ?Left Eye Distance:   ?Bilateral Distance:   ? ?Right Eye Near:   ?  Left Eye Near:    ?Bilateral Near:    ? ?Physical Exam ?Vitals and nursing note reviewed.  ?Constitutional:   ?   General: He is not in acute distress. ?   Appearance: Normal appearance.  ?HENT:  ?   Head: Normocephalic.  ?   Mouth/Throat:  ?   Mouth: Mucous membranes are moist.  ?Eyes:  ?   Extraocular Movements: Extraocular movements intact.  ?   Conjunctiva/sclera: Conjunctivae normal.  ?   Pupils: Pupils are equal, round, and reactive to light.  ?Cardiovascular:  ?   Rate and Rhythm: Normal rate and regular rhythm.  ?   Pulses: Normal pulses.  ?   Heart sounds: Normal heart sounds.  ?Pulmonary:  ?   Effort: Pulmonary effort is normal.  ?   Breath sounds: Normal breath sounds.  ?Abdominal:  ?   General: Bowel sounds are normal.  ?   Palpations: Abdomen is soft.  ?   Tenderness: There is abdominal tenderness in the periumbilical area. There is no right CVA tenderness or left CVA tenderness.  ?Skin: ?   General: Skin is warm and dry.  ?   Capillary Refill: Capillary refill takes less than 2 seconds.  ?Neurological:  ?   General: No focal deficit present.  ?   Mental Status: He is alert and oriented to person, place, and time.  ?Psychiatric:     ?   Mood and Affect: Mood normal.     ?   Behavior: Behavior normal.  ? ? ? ?UC Treatments / Results  ?Labs ?(all labs ordered are listed, but only abnormal results are displayed) ?Labs Reviewed  ?POCT URINALYSIS DIP (MANUAL ENTRY) - Abnormal; Notable for the following components:  ?    Result Value  ? Clarity, UA hazy (*)   ? Ketones, POC UA trace (5) (*)   ? Blood, UA trace-intact (*)   ? Protein Ur, POC =30 (*)   ? All other components within normal limits  ?URINE CULTURE  ?COMPREHENSIVE METABOLIC PANEL  ?CBC WITH DIFFERENTIAL/PLATELET  ? ? ?EKG ? ? ?Radiology ?No results found. ? ?Procedures ?Procedures  (including critical care time) ? ?Medications Ordered in UC ?Medications - No data to display ? ?Initial Impression / Assessment and Plan / UC Course  ?I have reviewed the triage vital signs and the nursing not

## 2021-11-16 NOTE — Discharge Instructions (Addendum)
Take medication as prescribed. ?If you have access to MyChart, you will be able to see your lab results.  They should be available within the next 24 to 48 hours. ?Monitor your diet to prevent any further exacerbation of your symptoms. ?Increase fluids.Increase the fiber in your diet. ?Follow-up in the ER for any worsening abdominal pain, fever, chills, diarrhea that is uncontrolled, or if symptoms do not improve. ?

## 2021-11-17 LAB — CBC WITH DIFFERENTIAL/PLATELET
Basophils Absolute: 0.1 10*3/uL (ref 0.0–0.2)
Basos: 1 %
EOS (ABSOLUTE): 0 10*3/uL (ref 0.0–0.4)
Eos: 0 %
Hematocrit: 39.2 % (ref 37.5–51.0)
Hemoglobin: 12.5 g/dL — ABNORMAL LOW (ref 13.0–17.7)
Immature Grans (Abs): 0 10*3/uL (ref 0.0–0.1)
Immature Granulocytes: 0 %
Lymphocytes Absolute: 1.4 10*3/uL (ref 0.7–3.1)
Lymphs: 16 %
MCH: 21.1 pg — ABNORMAL LOW (ref 26.6–33.0)
MCHC: 31.9 g/dL (ref 31.5–35.7)
MCV: 66 fL — ABNORMAL LOW (ref 79–97)
Monocytes Absolute: 0.7 10*3/uL (ref 0.1–0.9)
Monocytes: 7 %
Neutrophils Absolute: 6.9 10*3/uL (ref 1.4–7.0)
Neutrophils: 76 %
Platelets: 200 10*3/uL (ref 150–450)
RBC: 5.92 x10E6/uL — ABNORMAL HIGH (ref 4.14–5.80)
RDW: 18.2 % — ABNORMAL HIGH (ref 11.6–15.4)
WBC: 9.1 10*3/uL (ref 3.4–10.8)

## 2021-11-17 LAB — COMPREHENSIVE METABOLIC PANEL
ALT: 22 IU/L (ref 0–44)
AST: 20 IU/L (ref 0–40)
Albumin/Globulin Ratio: 2 (ref 1.2–2.2)
Albumin: 4.7 g/dL (ref 3.8–4.8)
Alkaline Phosphatase: 75 IU/L (ref 44–121)
BUN/Creatinine Ratio: 23 (ref 10–24)
BUN: 22 mg/dL (ref 8–27)
Bilirubin Total: 1 mg/dL (ref 0.0–1.2)
CO2: 26 mmol/L (ref 20–29)
Calcium: 9.6 mg/dL (ref 8.6–10.2)
Chloride: 101 mmol/L (ref 96–106)
Creatinine, Ser: 0.97 mg/dL (ref 0.76–1.27)
Globulin, Total: 2.4 g/dL (ref 1.5–4.5)
Glucose: 168 mg/dL — ABNORMAL HIGH (ref 70–99)
Potassium: 4.7 mmol/L (ref 3.5–5.2)
Sodium: 141 mmol/L (ref 134–144)
Total Protein: 7.1 g/dL (ref 6.0–8.5)
eGFR: 87 mL/min/{1.73_m2} (ref >59)

## 2021-11-18 ENCOUNTER — Ambulatory Visit (INDEPENDENT_AMBULATORY_CARE_PROVIDER_SITE_OTHER): Payer: Medicare Other | Admitting: Medical

## 2021-11-18 ENCOUNTER — Encounter: Payer: Self-pay | Admitting: Medical

## 2021-11-18 VITALS — BP 118/65 | HR 90 | Ht 70.0 in | Wt 184.0 lb

## 2021-11-18 DIAGNOSIS — R350 Frequency of micturition: Secondary | ICD-10-CM

## 2021-11-18 DIAGNOSIS — E118 Type 2 diabetes mellitus with unspecified complications: Secondary | ICD-10-CM | POA: Diagnosis not present

## 2021-11-18 DIAGNOSIS — I2510 Atherosclerotic heart disease of native coronary artery without angina pectoris: Secondary | ICD-10-CM

## 2021-11-18 DIAGNOSIS — F17200 Nicotine dependence, unspecified, uncomplicated: Secondary | ICD-10-CM

## 2021-11-18 DIAGNOSIS — K5792 Diverticulitis of intestine, part unspecified, without perforation or abscess without bleeding: Secondary | ICD-10-CM

## 2021-11-18 DIAGNOSIS — R1030 Lower abdominal pain, unspecified: Secondary | ICD-10-CM | POA: Diagnosis not present

## 2021-11-18 LAB — COMPREHENSIVE METABOLIC PANEL
ALT: 21 U/L (ref 0–53)
AST: 20 U/L (ref 0–37)
Albumin: 4.7 g/dL (ref 3.5–5.2)
Alkaline Phosphatase: 59 U/L (ref 39–117)
BUN: 19 mg/dL (ref 6–23)
CO2: 31 mEq/L (ref 19–32)
Calcium: 9.7 mg/dL (ref 8.4–10.5)
Chloride: 101 mEq/L (ref 96–112)
Creatinine, Ser: 0.98 mg/dL (ref 0.40–1.50)
GFR: 80.93 mL/min (ref >60.00)
Glucose, Bld: 137 mg/dL — ABNORMAL HIGH (ref 70–99)
Potassium: 4.6 mEq/L (ref 3.5–5.1)
Sodium: 138 mEq/L (ref 135–145)
Total Bilirubin: 0.8 mg/dL (ref 0.2–1.2)
Total Protein: 7.3 g/dL (ref 6.0–8.3)

## 2021-11-18 LAB — POC URINALSYSI DIPSTICK (AUTOMATED)
Bilirubin, UA: NEGATIVE
Glucose, UA: NEGATIVE
Ketones, UA: NEGATIVE
Leukocytes, UA: NEGATIVE
Nitrite, UA: NEGATIVE
Protein, UA: NEGATIVE
Spec Grav, UA: 1.015 (ref 1.010–1.025)
Urobilinogen, UA: 0.2 E.U./dL
pH, UA: 5 (ref 5.0–8.0)

## 2021-11-18 LAB — CBC WITH DIFFERENTIAL/PLATELET
Basophils Absolute: 0.1 10*3/uL (ref 0.0–0.1)
Basophils Relative: 0.9 % (ref 0.0–3.0)
Eosinophils Absolute: 0 10*3/uL (ref 0.0–0.7)
Eosinophils Relative: 0.3 % (ref 0.0–5.0)
HCT: 36.5 % — ABNORMAL LOW (ref 39.0–52.0)
Hemoglobin: 11.7 g/dL — ABNORMAL LOW (ref 13.0–17.0)
Lymphocytes Relative: 23.2 % (ref 12.0–46.0)
Lymphs Abs: 1.3 10*3/uL (ref 0.7–4.0)
MCHC: 32 g/dL (ref 30.0–36.0)
MCV: 63.9 fl — ABNORMAL LOW (ref 78.0–100.0)
Monocytes Absolute: 0.4 10*3/uL (ref 0.1–1.0)
Monocytes Relative: 7.5 % (ref 3.0–12.0)
Neutro Abs: 3.8 10*3/uL (ref 1.4–7.7)
Neutrophils Relative %: 68.1 % (ref 43.0–77.0)
Platelets: 183 10*3/uL (ref 150.0–400.0)
RBC: 5.71 Mil/uL (ref 4.22–5.81)
RDW: 16 % — ABNORMAL HIGH (ref 11.5–15.5)
WBC: 5.5 10*3/uL (ref 4.0–10.5)

## 2021-11-18 LAB — LIPASE: Lipase: 29 U/L (ref 11.0–59.0)

## 2021-11-18 LAB — URINE CULTURE: Culture: NO GROWTH

## 2021-11-18 LAB — PSA: PSA: 0.04 ng/mL — ABNORMAL LOW (ref 0.10–4.00)

## 2021-11-18 MED ORDER — SITAGLIPTIN PHOSPHATE 25 MG PO TABS: 25.0000 mg | ORAL_TABLET | Freq: Every day | ORAL | 3 refills | Status: DC

## 2021-11-18 NOTE — Progress Notes (Signed)
? ?Subjective:  ? ? Patient ID: Alec [name], male    DOB: 04-08-1956, 66 y.o.   MRN: [id number] ? ?HPI ? ?Pt in for follow up. ? ?Pt last a1c was 8.4. he usually in past had a1c around 6.5. Pt was advised to come in to discuss. He did not increase metformin to 1000 mg twice daily. No gi side effects that he noted with lower dose.  ? ? ?Pt has upcoming appt with urologist upcoming Tuesday. Pt has recent increased frequency of urination today. Prostate CA history. February psa was 0.3. ? ? ?Pt has mild high cholesterol. He is on crestor.  ? ?Recent periumbilical abd pain and seen in ED. 11-16-2021 visit. ? ? ?"Alec [name] is a 66 y.o. male.  ?  ?The patient is a 66 year old male who presents with abdominal pain and diarrhea.  Symptoms have been present for the past week.  Patient reports a history of diverticulitis.  Patient states "I know this is what it is because I have had it some many times".  He also reports states he is having approximately 5 diarrhea stools per day since his symptoms started.  He denies fever, chills, or urinary symptoms.  He cannot recall when he was last treated for diverticulitis, thinks it was a year or so ago.  He also has a history of prostate cancer that he received radiation for in 2020." ? ? ? ? ?"A/P ? ?Pertinent labs & imaging results that were available during my care of the patient were reviewed by me and considered in my medical decision making (see chart for details). ?  ?The patient is a 66 year old male who presents for abdominal pain.  Symptoms have been present for the past week with diarrhea.  Patient does have a history of diverticulitis.  Patient feels that he knows that is what his symptoms are.  Exam is reassuring as he has periumbilical tenderness bilaterally.  His vital signs are stable at this time.  We will also collect a CBC and CMP to determine if there is any infection or other abnormal results that may be contributing to his symptoms.  Urinalysis  does show blood, but this is most likely due to his history of prostate cancer.  We will start the patient on Cipro and Flagyl.  Patient was also advised to monitor his dietary intake which should include increasing his fiber.  Strict return precautions were provided to the patient including indications to go to the ER for any worsening abdominal pain, fever chills, or other concerns.  Follow-up as needed." ? ?Pt states cipro and flagyl appears to be helping. Less pain. Not having any diarrhea. Pain level now 1-2 at most on and office. Before antibiotic he had level 7-10 pain. ? ?On review no ct abd/pelvis done. ?Cbc did not show wbc elevation. ? ?Cmp normal except sugar elevation of 168. ? ? ? ?Review of Systems  ?Constitutional:  Negative for chills, fatigue and fever.  ?Respiratory:  Negative for cough, chest tightness, shortness of breath and wheezing.   ?Cardiovascular:  Negative for chest pain and palpitations.  ?Gastrointestinal:  Positive for abdominal distention. Negative for constipation, diarrhea and rectal pain.  ?Genitourinary:  Positive for frequency. Negative for difficulty urinating and dysuria.  ?Musculoskeletal:  Negative for back pain and myalgias.  ?Skin:  Negative for rash.  ?Neurological:  Negative for dizziness, seizures, numbness and headaches.  ?Hematological:  Negative for adenopathy. Does not bruise/bleed easily.  ?Psychiatric/Behavioral:  Negative for behavioral problems,  dysphoric mood and hallucinations.   ? ? ?Past Medical History:  ?Diagnosis Date  ? ALLERGIC RHINITIS   ? Allergy   ? Arthritis   ? Diabetes mellitus without complication ([place])   ? type 2  ? DIVERTICULITIS, HX OF   ? GERD   ? LEG PAIN, LEFT   ? Prostate cancer ([place])   ? SINUSITIS, CHRONIC   ? SMOKER   ? ?  ?Social History  ? ?Socioeconomic History  ? Marital status: Married  ?  Spouse name: Not on file  ? Number of children: Not on file  ? Years of education: Not on file  ? Highest education level: Not on file   ?Occupational History  ? Occupation: Librarian, academic   ?Tobacco Use  ? Smoking status: Every Day  ?  Packs/day: 0.75  ?  Years: 40.00  ?  Pack years: 30.00  ?  Types: Cigarettes  ? Smokeless tobacco: Never  ? Tobacco comments:  ?  trying to quit, from 1 1/2 ppd to 13-14/day  ?Vaping Use  ? Vaping Use: Never used  ?Substance and Sexual Activity  ? Alcohol use: No  ? Drug use: No  ? Sexual activity: Yes  ?Other Topics Concern  ? Not on file  ?Social History Narrative  ? 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 Married, lives with wife (MM dx 2009)  ? ?Social Determinants of Health  ? ?Financial Resource Strain: Not on file  ?Food Insecurity: Not on file  ?Transportation Needs: Not on file  ?Physical Activity: Not on file  ?Stress: Not on file  ?Social Connections: Not on file  ?Intimate Partner Violence: Not on file  ? ? ?Past Surgical History:  ?Procedure Laterality Date  ? FINGER SURGERY    ? PROSTATE BIOPSY    ? RADIOACTIVE SEED IMPLANT N/A 09/20/2018  ? Procedure: RADIOACTIVE SEED IMPLANT/BRACHYTHERAPY IMPLANT;  Surgeon: Irine Seal, MD;  Location: [facility];  Service: Urology;  Laterality: N/A;  ? SPACE OAR INSTILLATION N/A 09/20/2018  ? Procedure: SPACE OAR INSTILLATION;  Surgeon: Irine Seal, MD;  Location: [facility];  Service: Urology;  Laterality: N/A;  ? SPINE SURGERY    ? lumbar surgery 2012.  ? ? ?Family History  ?Problem Relation Age of Onset  ? Alzheimer's disease Mother   ? Heart disease Father   ? Parkinsonism Father   ? Alcohol abuse Brother   ? Colon cancer Neg Hx   ? Esophageal cancer Neg Hx   ? Liver cancer Neg Hx   ? Pancreatic cancer Neg Hx   ? Rectal cancer Neg Hx   ? Stomach cancer Neg Hx   ? ? ?No Known Allergies ? ?Current Outpatient Medications on File Prior to Visit  ?Medication Sig Dispense Refill  ? ciprofloxacin (CIPRO) 500 MG tablet Take 1 tablet (500 mg total) by mouth every 12 (twelve) hours for 5 days. 10 tablet 0  ? fluticasone (FLONASE) 50 MCG/ACT  nasal spray Place 2 sprays into both nostrils daily. 16 g 6  ? glucose blood (ONETOUCH VERIO) test strip Use as instructed 100 each 12  ? hydrocortisone-pramoxine (PROCTOFOAM-HC) rectal foam Place 1 applicator rectally 2 (two) times daily. 10 g 0  ? levocetirizine (XYZAL) 5 MG tablet Take 1 tablet (5 mg total) by mouth every evening. 30 tablet 3  ? metFORMIN (GLUCOPHAGE) 500 MG tablet Take 1 tablet (500 mg total) by mouth 2 (two) times daily with a meal. 60 tablet 11  ? metoprolol tartrate (LOPRESSOR)  100 MG tablet Take 100 mg 2 hours before CT 1 tablet 0  ? metroNIDAZOLE (FLAGYL) 500 MG tablet Take 1 tablet (500 mg total) by mouth 2 (two) times daily for 7 days. 14 tablet 0  ? mupirocin ointment (BACTROBAN) 2 % Apply 1 application topically 2 (two) times daily. 30 g 0  ? ONETOUCH DELICA LANCETS 94I MISC Use as directed. 100 each 12  ? rosuvastatin (CRESTOR) 20 MG tablet Take 1 tablet (20 mg total) by mouth daily. 1 tab po q day 30 tablet 0  ? tadalafil (CIALIS) 10 MG tablet Take 1 tablet (10 mg total) by mouth every other day as needed for erectile dysfunction. 10 tablet 1  ? tamsulosin (FLOMAX) 0.4 MG CAPS capsule Take 1 capsule by mouth twice daily 60 capsule 0  ? ?Current Facility-Administered Medications on File Prior to Visit  ?Medication Dose Route Frequency Provider Last Rate Last Admin  ? hydrocortisone-pramoxine (PROCTOFOAM-HC) rectal foam 1 applicator  1 applicator Rectal BID Lameeka Schleifer, Percell Miller, PA-C      ? ? ?BP (!) 150/100   Pulse 90   Ht '5\' 10"'$  (1.778 m)   Wt 184 lb (83.5 kg)   SpO2 100%   BMI 26.40 kg/m?  ?  ?   ?Objective:  ? Physical Exam ? ?General ?Mental Status- Alert. General Appearance- Not in acute distress.  ? ?Skin ?General: Color- Normal Color. Moisture- Normal Moisture. ? ?Neck ?Carotid Arteries- Normal color. Moisture- Normal Moisture. No carotid bruits. No JVD. ? ?Chest and Lung Exam ?Auscultation: ?Breath Sounds:-Normal. ? ?Cardiovascular ?Auscultation:Rythm- Regular. ?Murmurs & Other  Heart Sounds:Auscultation of the heart reveals- No Murmurs. ? ?Abdomen ?Inspection:-Inspeection Normal. ?Palpation/Percussion:Note:No mass. Palpation and Percussion of the abdomen reveal- mid lower suprapubic reg

## 2021-11-18 NOTE — Patient Instructions (Addendum)
Recent lower abdomen pain that was severe at the time of the emergency department evaluation.  Level pain was 7-10/10.  Since starting Flagyl and Cipro pain has subsided down to level 1-2.  Known diverticulosis on prior colonoscopy.  With recent high-level pain I do want to know whether or not you had diverticulitis.  We will repeat CBC, CMP and place order for CT abdomen pelvis with contrast.  Adding lipase to labs as well.  Continue Cipro and Flagyl. ? ?Prostate CA history with the radiation treatment.  UA done today, urine culture and PSA level.  Follow-up with urologist next week. ? ?Diabetes with most recent A1c 8.4.  This is off your baseline of 6.5.  Continue metformin 500 mg twice daily.  Adding Januvia 25 mg daily. ? ?Coronary artery disease.  Placed referral to cardiologist in our building since you no longer have established cardiologist. ? ?Again encouraged to stop smoking completely if possible. ? ?Follow-up in 10 days or sooner if needed. ?

## 2021-11-19 LAB — URINE CULTURE
MICRO NUMBER:: 13383056
Result:: NO GROWTH
SPECIMEN QUALITY:: ADEQUATE

## 2021-11-20 ENCOUNTER — Telehealth: Payer: Self-pay | Admitting: Medical

## 2021-11-20 NOTE — Telephone Encounter (Signed)
Will you try to get prior auth for pt ct abd/pelvis. Coordinate with imaging to get pt scheduled. thanks ?

## 2021-11-23 ENCOUNTER — Ambulatory Visit: Payer: Medicare Other | Admitting: Urology

## 2021-11-23 ENCOUNTER — Encounter: Payer: Self-pay | Admitting: Urology

## 2021-11-23 VITALS — BP 136/66 | HR 91

## 2021-11-23 DIAGNOSIS — R3915 Urgency of urination: Secondary | ICD-10-CM | POA: Diagnosis not present

## 2021-11-23 DIAGNOSIS — N401 Enlarged prostate with lower urinary tract symptoms: Secondary | ICD-10-CM

## 2021-11-23 DIAGNOSIS — Z8546 Personal history of malignant neoplasm of prostate: Secondary | ICD-10-CM | POA: Diagnosis not present

## 2021-11-23 DIAGNOSIS — N5201 Erectile dysfunction due to arterial insufficiency: Secondary | ICD-10-CM

## 2021-11-23 DIAGNOSIS — K5792 Diverticulitis of intestine, part unspecified, without perforation or abscess without bleeding: Secondary | ICD-10-CM

## 2021-11-23 NOTE — Progress Notes (Signed)
History of Present Illness:  ? ? 9.27.2019--underwent ultrasound and biopsy of his prostate.  PSA 4.02, prostate size 30 mL, 2 cores revealed GS 3+3 pattern.  PSAD 0.13. ?  ?3.12.2020: He underwent brachytherapy with SpaceOAR instillation  ?  ?5.16.2023: PSA 0.04. Is complaining of frequency and urgency.  Does drink a significant amount of caffeinated beverages.  He has had no gross hematuria but states that he has frequent microscopic hematuria.  Minimal dysuria. ? ?Past Medical History:  ?Diagnosis Date  ? ALLERGIC RHINITIS   ? Allergy   ? Arthritis   ? Diabetes mellitus without complication ([place])   ? type 2  ? DIVERTICULITIS, HX OF   ? GERD   ? LEG PAIN, LEFT   ? Prostate cancer ([place])   ? SINUSITIS, CHRONIC   ? SMOKER   ? ? ?Past Surgical History:  ?Procedure Laterality Date  ? FINGER SURGERY    ? PROSTATE BIOPSY    ? RADIOACTIVE SEED IMPLANT N/A 09/20/2018  ? Procedure: RADIOACTIVE SEED IMPLANT/BRACHYTHERAPY IMPLANT;  Surgeon: Irine Seal, MD;  Location: [facility];  Service: Urology;  Laterality: N/A;  ? SPACE OAR INSTILLATION N/A 09/20/2018  ? Procedure: SPACE OAR INSTILLATION;  Surgeon: Irine Seal, MD;  Location: [facility];  Service: Urology;  Laterality: N/A;  ? SPINE SURGERY    ? lumbar surgery 2012.  ? ? ?Home Medications:  ?Allergies as of 11/23/2021   ?No Known Allergies ?  ? ?  ?Medication List  ?  ? ?  ? Accurate as of Nov 23, 2021  8:12 AM. If you have any questions, ask your nurse or doctor.  ?  ?  ? ?  ? ?fluticasone 50 MCG/ACT nasal spray ?Commonly known as: FLONASE ?Place 2 sprays into both nostrils daily. ?  ?glucose blood test strip ?Commonly known as: OneTouch Verio ?Use as instructed ?  ?hydrocortisone-pramoxine rectal foam ?Commonly known as: PROCTOFOAM-HC ?Place 1 applicator rectally 2 (two) times daily. ?  ?levocetirizine 5 MG tablet ?Commonly known as: XYZAL ?Take 1 tablet (5 mg total) by mouth every evening. ?  ?metFORMIN 500 MG tablet ?Commonly known as:  GLUCOPHAGE ?Take 1 tablet (500 mg total) by mouth 2 (two) times daily with a meal. ?  ?metoprolol tartrate 100 MG tablet ?Commonly known as: LOPRESSOR ?Take 100 mg 2 hours before CT ?  ?metroNIDAZOLE 500 MG tablet ?Commonly known as: Flagyl ?Take 1 tablet (500 mg total) by mouth 2 (two) times daily for 7 days. ?  ?mupirocin ointment 2 % ?Commonly known as: BACTROBAN ?Apply 1 application topically 2 (two) times daily. ?  ?OneTouch Delica Lancets 11Z Misc ?Use as directed. ?  ?rosuvastatin 20 MG tablet ?Commonly known as: Crestor ?Take 1 tablet (20 mg total) by mouth daily. 1 tab po q day ?  ?sitaGLIPtin 25 MG tablet ?Commonly known as: Januvia ?Take 1 tablet (25 mg total) by mouth daily. ?  ?tadalafil 10 MG tablet ?Commonly known as: CIALIS ?Take 1 tablet (10 mg total) by mouth every other day as needed for erectile dysfunction. ?  ?tamsulosin 0.4 MG Caps capsule ?Commonly known as: FLOMAX ?Take 1 capsule by mouth twice daily ?  ? ?  ? ? ?Allergies: No Known Allergies ? ?Family History  ?Problem Relation Age of Onset  ? Alzheimer's disease Mother   ? Heart disease Father   ? Parkinsonism Father   ? Alcohol abuse Brother   ? Colon cancer Neg Hx   ? Esophageal cancer Neg Hx   ?  Liver cancer Neg Hx   ? Pancreatic cancer Neg Hx   ? Rectal cancer Neg Hx   ? Stomach cancer Neg Hx   ? ? ?Social History:  reports that he has been smoking cigarettes. He has a 30.00 pack-year smoking history. He has never used smokeless tobacco. He reports that he does not drink alcohol and does not use drugs. ? ?ROS: ?A complete review of systems was performed.  All systems are negative except for pertinent findings as noted. ? ?Physical Exam:  ?Vital signs in last 24 hours: ?There were no vitals taken for this visit. ?Constitutional:  Alert and oriented, No acute distress ?Cardiovascular: Regular rate  ?Respiratory: Normal respiratory effort ?Neurologic: Grossly intact, no focal deficits ?Psychiatric: Normal mood and affect ? ?I have  reviewed prior pt notes ? ?I have reviewed notes from referring/previous physicians ? ?I have reviewed urinalysis results ? ?I have independently reviewed prior imaging--residual urine volume bladder scan 38 mL ? ?I have reviewed prior PSA/pathology results ? ? ? ? ?Impression/Assessment:  ?Grade group 1 adenocarcinoma the prostate, status post I-125 brachytherapy/SpaceOAR placement in March, 2020.  Excellent PSA response thus far ? ?BPH with lower urinary tract symptoms, bothersome frequency and urgency, could be overactive bladder in nature. ? ?Plan:  ?1.  Limit caffeine intake ? ?2.  I will give him samples of Myrbetriq 50 mg for a month.  If this works well, he will call us and we will prescribe different medication/generic substitute ? ?3.  Office visit 6 months ? ?

## 2021-11-24 ENCOUNTER — Encounter: Payer: Self-pay | Admitting: Medical

## 2021-11-24 ENCOUNTER — Ambulatory Visit (HOSPITAL_BASED_OUTPATIENT_CLINIC_OR_DEPARTMENT_OTHER)
Admission: RE | Admit: 2021-11-24 | Discharge: 2021-11-24 | Disposition: A | Discharge status: Home / Self Care | Payer: Medicare Other | Source: Ambulatory Visit | Attending: Medical | Admitting: Medical

## 2021-11-24 ENCOUNTER — Encounter (HOSPITAL_BASED_OUTPATIENT_CLINIC_OR_DEPARTMENT_OTHER): Payer: Self-pay

## 2021-11-24 DIAGNOSIS — R1030 Lower abdominal pain, unspecified: Secondary | ICD-10-CM | POA: Diagnosis not present

## 2021-11-24 DIAGNOSIS — K5732 Diverticulitis of large intestine without perforation or abscess without bleeding: Secondary | ICD-10-CM | POA: Diagnosis not present

## 2021-11-24 LAB — URINALYSIS, ROUTINE W REFLEX MICROSCOPIC
Bilirubin, UA: NEGATIVE
Glucose, UA: NEGATIVE
Ketones, UA: NEGATIVE
Leukocytes,UA: NEGATIVE
Nitrite, UA: NEGATIVE
Protein,UA: NEGATIVE
Specific Gravity, UA: 1.025 (ref 1.005–1.030)
Urobilinogen, Ur: 0.2 mg/dL (ref 0.2–1.0)
pH, UA: 5.5 (ref 5.0–7.5)

## 2021-11-24 LAB — MICROSCOPIC EXAMINATION
Bacteria, UA: NONE SEEN
Epithelial Cells (non renal): NONE SEEN /hpf (ref 0–10)
Renal Epithel, UA: NONE SEEN /hpf
WBC, UA: NONE SEEN /hpf (ref 0–5)

## 2021-11-24 MED ORDER — IOHEXOL 300 MG/ML  SOLN
100.0000 mL | Freq: Once | INTRAMUSCULAR | Status: AC | PRN
  Administered 2021-11-24: 100 mL via INTRAVENOUS

## 2021-11-24 MED ORDER — CIPROFLOXACIN HCL 500 MG PO TABS: 500.0000 mg | ORAL_TABLET | Freq: Two times a day (BID) | ORAL | 0 refills | Status: DC

## 2021-11-24 MED ORDER — METRONIDAZOLE 500 MG PO TABS: 500.0000 mg | ORAL_TABLET | Freq: Three times a day (TID) | ORAL | 0 refills | Status: AC

## 2021-11-24 NOTE — Addendum Note (Signed)
Addended by: Anabel Halon on: 11/24/2021 11:00 AM ? ? Modules accepted: Orders ? ?

## 2021-11-24 NOTE — Addendum Note (Signed)
Addended by: Anabel Halon on: 11/24/2021 03:59 PM ? ? Modules accepted: Orders ? ?

## 2021-11-24 NOTE — Addendum Note (Signed)
Addended by: Anabel Halon on: 11/24/2021 04:01 PM ? ? Modules accepted: Orders ? ?

## 2021-11-25 ENCOUNTER — Ambulatory Visit (INDEPENDENT_AMBULATORY_CARE_PROVIDER_SITE_OTHER): Payer: Medicare Other | Admitting: Medical

## 2021-11-25 VITALS — BP 126/60 | HR 66 | Temp 98.0°F | Resp 18 | Ht 70.0 in | Wt 184.0 lb

## 2021-11-25 DIAGNOSIS — K5792 Diverticulitis of intestine, part unspecified, without perforation or abscess without bleeding: Secondary | ICD-10-CM

## 2021-11-25 DIAGNOSIS — E118 Type 2 diabetes mellitus with unspecified complications: Secondary | ICD-10-CM | POA: Diagnosis not present

## 2021-11-25 LAB — CBC WITH DIFFERENTIAL/PLATELET
Basophils Absolute: 0.1 10*3/uL (ref 0.0–0.1)
Basophils Relative: 1.4 % (ref 0.0–3.0)
Eosinophils Absolute: 0.1 10*3/uL (ref 0.0–0.7)
Eosinophils Relative: 1 % (ref 0.0–5.0)
HCT: 37.3 % — ABNORMAL LOW (ref 39.0–52.0)
Hemoglobin: 11.8 g/dL — ABNORMAL LOW (ref 13.0–17.0)
Lymphocytes Relative: 22.2 % (ref 12.0–46.0)
Lymphs Abs: 1.5 10*3/uL (ref 0.7–4.0)
MCHC: 31.7 g/dL (ref 30.0–36.0)
MCV: 64.3 fl — ABNORMAL LOW (ref 78.0–100.0)
Monocytes Absolute: 0.6 10*3/uL (ref 0.1–1.0)
Monocytes Relative: 9.3 % (ref 3.0–12.0)
Neutro Abs: 4.4 10*3/uL (ref 1.4–7.7)
Neutrophils Relative %: 66.1 % (ref 43.0–77.0)
Platelets: 213 10*3/uL (ref 150.0–400.0)
RBC: 5.8 Mil/uL (ref 4.22–5.81)
RDW: 16.3 % — ABNORMAL HIGH (ref 11.5–15.5)
WBC: 6.6 10*3/uL (ref 4.0–10.5)

## 2021-11-25 LAB — COMPREHENSIVE METABOLIC PANEL
ALT: 40 U/L (ref 0–53)
AST: 25 U/L (ref 0–37)
Albumin: 4.8 g/dL (ref 3.5–5.2)
Alkaline Phosphatase: 56 U/L (ref 39–117)
BUN: 21 mg/dL (ref 6–23)
CO2: 29 mEq/L (ref 19–32)
Calcium: 9.6 mg/dL (ref 8.4–10.5)
Chloride: 99 mEq/L (ref 96–112)
Creatinine, Ser: 0.93 mg/dL (ref 0.40–1.50)
GFR: 86.17 mL/min (ref >60.00)
Glucose, Bld: 132 mg/dL — ABNORMAL HIGH (ref 70–99)
Potassium: 4.7 mEq/L (ref 3.5–5.1)
Sodium: 137 mEq/L (ref 135–145)
Total Bilirubin: 0.9 mg/dL (ref 0.2–1.2)
Total Protein: 6.9 g/dL (ref 6.0–8.3)

## 2021-11-25 MED ORDER — HYDROCODONE-ACETAMINOPHEN 5-325 MG PO TABS: 1.0000 | ORAL_TABLET | Freq: Four times a day (QID) | ORAL | 0 refills | Status: DC | PRN

## 2021-11-25 NOTE — Patient Instructions (Addendum)
Diverticulitis with persisting pain that can be moderate at times. Giving additional 7 days of cipro and flagyl antibiotic. Will give 2 days of norco to help with pain. If pain perisst despite this let us know. On CT showed diverticulitis but no abscess or perforation.  Will get cbc and cmp stat.  Will follow labs and see how you do. Give me update in 5 days or sooner. Will see if you need to be seen in office. Appointment with gi in June. Will see if appointment needs to be moved up.  Diverticulitis  Diverticulitis is infection or inflammation of small pouches (diverticula) in the colon that form due to a condition called diverticulosis. Diverticula can trap stool (feces) and bacteria, causing infection and inflammation. Diverticulitis may cause severe stomach pain and diarrhea. It may lead to tissue damage in the colon that causes bleeding or blockage. The diverticula may also burst (rupture) and cause infected stool to enter other areas of the abdomen. What are the causes? This condition is caused by stool becoming trapped in the diverticula, which allows bacteria to grow in the diverticula. This leads to inflammation and infection. What increases the risk? You are more likely to develop this condition if you have diverticulosis. The risk increases if you: Are overweight or obese. Do not get enough exercise. Drink alcohol. Use tobacco products. Eat a diet that has a lot of red meat such as beef, pork, or lamb. Eat a diet that does not include enough fiber. High-fiber foods include fruits, vegetables, beans, nuts, and whole grains. Are over 12 years of age. What are the signs or symptoms? Symptoms of this condition may include: Pain and tenderness in the abdomen. The pain is normally located on the left side of the abdomen, but it may occur in other areas. Fever and chills. Nausea. Vomiting. Cramping. Bloating. Changes in bowel routines. Blood in your stool. How is this  diagnosed? This condition is diagnosed based on: Your medical history. A physical exam. Tests to make sure there is nothing else causing your condition. These tests may include: Blood tests. Urine tests. CT scan of the abdomen. How is this treated? Most cases of this condition are mild and can be treated at home. Treatment may include: Taking over-the-counter pain medicines. Following a clear liquid diet. Taking antibiotic medicines by mouth. Resting. More severe cases may need to be treated at a hospital. Treatment may include: Not eating or drinking. Taking prescription pain medicine. Receiving antibiotic medicines through an IV. Receiving fluids and nutrition through an IV. Surgery. When your condition is under control, your health care provider may recommend that you have a colonoscopy. This is an exam to look at the entire large intestine. During the exam, a lubricated, bendable tube is inserted into the anus and then passed into the rectum, colon, and other parts of the large intestine. A colonoscopy can show how severe your diverticula are and whether something else may be causing your symptoms. Follow these instructions at home: Medicines Take over-the-counter and prescription medicines only as told by your health care provider. These include fiber supplements, probiotics, and stool softeners. If you were prescribed an antibiotic medicine, take it as told by your health care provider. Do not stop taking the antibiotic even if you start to feel better. Ask your health care provider if the medicine prescribed to you requires you to avoid driving or using machinery. Eating and drinking  Follow a full liquid diet or another diet as directed by your health  care provider. After your symptoms improve, your health care provider may tell you to change your diet. He or she may recommend that you eat a diet that contains at least 25 grams (25 g) of fiber daily. Fiber makes it easier to pass  stool. Healthy sources of fiber include: Berries. One cup contains 4-8 grams of fiber. Beans or lentils. One-half cup contains 5-8 grams of fiber. Green vegetables. One cup contains 4 grams of fiber. Avoid eating red meat. General instructions Do not use any products that contain nicotine or tobacco, such as cigarettes, e-cigarettes, and chewing tobacco. If you need help quitting, ask your health care provider. Exercise for at least 30 minutes, 3 times each week. You should exercise hard enough to raise your heart rate and break a sweat. Keep all follow-up visits as told by your health care provider. This is important. You may need to have a colonoscopy. Contact a health care provider if: Your pain does not improve. Your bowel movements do not return to normal. Get help right away if: Your pain gets worse. Your symptoms do not get better with treatment. Your symptoms suddenly get worse. You have a fever. You vomit more than one time. You have stools that are bloody, black, or tarry. Summary Diverticulitis is infection or inflammation of small pouches (diverticula) in the colon that form due to a condition called diverticulosis. Diverticula can trap stool (feces) and bacteria, causing infection and inflammation. You are at higher risk for this condition if you have diverticulosis and you eat a diet that does not include enough fiber. Most cases of this condition are mild and can be treated at home. More severe cases may need to be treated at a hospital. When your condition is under control, your health care provider may recommend that you have an exam called a colonoscopy. This exam can show how severe your diverticula are and whether something else may be causing your symptoms. Keep all follow-up visits as told by your health care provider. This is important. This information is not intended to replace advice given to you by your health care provider. Make sure you discuss any questions you  have with your health care provider. Document Revised: 04/08/2019 Document Reviewed: 04/08/2019 Elsevier Patient Education  [place].

## 2021-11-25 NOTE — Progress Notes (Signed)
Subjective:    Patient ID: Alec [name], male    DOB: 12/31/1955, 66 y.o.   MRN: [id number]  HPI  Pt in for follow up for diverticultis. I saw pt after ED evaluation. He was given cipro and flagyl but no imaging study done at time I saw pt. I had advised continue cipro and flagyl and place ct abdomen and pelvis which confirmed diverticulitis. Pt sent my chart message yesterday describing overall better but still having days with mild to moderate pain. Day before yesterday more painful than other days. ED only gave 5 days of cipro and he was about to run out of flagyl. Describing every other day moderate painful.  I refill both meds yesterday for 7 days and ask pt to follow up today.  I have placed referral to GI MD as well for follow up  Review of Systems  Constitutional:  Negative for chills, fatigue and fever.  Respiratory:  Negative for cough, chest tightness, shortness of breath and wheezing.   Cardiovascular:  Negative for chest pain and palpitations.  Gastrointestinal:  Positive for abdominal pain. Negative for blood in stool, constipation, diarrhea, nausea and vomiting.       Mild inttermittent. Thought more moderate described day before yesterday.  Musculoskeletal:  Negative for back pain and neck pain.  Skin:  Negative for rash.  Neurological:  Negative for dizziness.  Hematological:  Negative for adenopathy. Does not bruise/bleed easily.  Psychiatric/Behavioral:  Negative for behavioral problems and confusion.     Past Medical History:  Diagnosis Date   ALLERGIC RHINITIS    Allergy    Arthritis    Diabetes mellitus without complication (HCC)    type 2   DIVERTICULITIS, HX OF    GERD    LEG PAIN, LEFT    Prostate cancer ([place])    SINUSITIS, CHRONIC    SMOKER      Social History   Socioeconomic History   Marital status: Married    Spouse name: Not on file   Number of children: Not on file   Years of education: Not on file   Highest education level: Not  on file  Occupational History   Occupation: Supervisor   Tobacco Use   Smoking status: Every Day    Packs/day: 0.75    Years: 40.00    Pack years: 30.00    Types: Cigarettes   Smokeless tobacco: Never   Tobacco comments:    trying to quit, from 1 1/2 ppd to 13-14/day  Vaping Use   Vaping Use: Never used  Substance and Sexual Activity   Alcohol use: No   Drug use: No   Sexual activity: Yes  Other Topics Concern   Not on file  Social History Narrative   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   Married, lives with wife (MM dx 2009)   Social Determinants of Health   Financial Resource Strain: Not on file  Food Insecurity: Not on file  Transportation Needs: Not on file  Physical Activity: Not on file  Stress: Not on file  Social Connections: Not on file  Intimate Partner Violence: Not on file    Past Surgical History:  Procedure Laterality Date   [place] N/A 09/20/2018   Procedure: RADIOACTIVE SEED IMPLANT/BRACHYTHERAPY IMPLANT;  Surgeon: Irine Seal, MD;  Location: [place];  Service: Urology;  Laterality: N/A;   SPACE OAR INSTILLATION N/A 09/20/2018   Procedure:  SPACE OAR INSTILLATION;  Surgeon: Irine Seal, MD;  Location: [facility];  Service: Urology;  Laterality: N/A;   SPINE SURGERY     lumbar surgery 2012.    Family History  Problem Relation Age of Onset   Alzheimer's disease Mother    Heart disease Father    Parkinsonism Father    Alcohol abuse Brother    Colon cancer Neg Hx    Esophageal cancer Neg Hx    Liver cancer Neg Hx    Pancreatic cancer Neg Hx    Rectal cancer Neg Hx    Stomach cancer Neg Hx     No Known Allergies  Current Outpatient Medications on File Prior to Visit  Medication Sig Dispense Refill   ciprofloxacin (CIPRO) 500 MG tablet Take 1 tablet (500 mg total) by mouth 2 (two) times daily. 14 tablet 0   fluticasone (FLONASE) 50 MCG/ACT nasal  spray Place 2 sprays into both nostrils daily. 16 g 6   glucose blood (ONETOUCH VERIO) test strip Use as instructed 100 each 12   hydrocortisone-pramoxine (PROCTOFOAM-HC) rectal foam Place 1 applicator rectally 2 (two) times daily. 10 g 0   levocetirizine (XYZAL) 5 MG tablet Take 1 tablet (5 mg total) by mouth every evening. 30 tablet 3   metFORMIN (GLUCOPHAGE) 500 MG tablet Take 1 tablet (500 mg total) by mouth 2 (two) times daily with a meal. 60 tablet 11   metoprolol tartrate (LOPRESSOR) 100 MG tablet Take 100 mg 2 hours before CT 1 tablet 0   metroNIDAZOLE (FLAGYL) 500 MG tablet Take 1 tablet (500 mg total) by mouth 3 (three) times daily for 7 days. 21 tablet 0   mupirocin ointment (BACTROBAN) 2 % Apply 1 application topically 2 (two) times daily. 30 g 0   ONETOUCH DELICA LANCETS 31V MISC Use as directed. 100 each 12   rosuvastatin (CRESTOR) 20 MG tablet Take 1 tablet (20 mg total) by mouth daily. 1 tab po q day 30 tablet 0   sitaGLIPtin (JANUVIA) 25 MG tablet Take 1 tablet (25 mg total) by mouth daily. 30 tablet 3   tadalafil (CIALIS) 10 MG tablet Take 1 tablet (10 mg total) by mouth every other day as needed for erectile dysfunction. 10 tablet 1   tamsulosin (FLOMAX) 0.4 MG CAPS capsule Take 1 capsule by mouth twice daily 60 capsule 0   Current Facility-Administered Medications on File Prior to Visit  Medication Dose Route Frequency Provider Last Rate Last Admin   hydrocortisone-pramoxine (PROCTOFOAM-HC) rectal foam 1 applicator  1 applicator Rectal BID Lonna Rabold, PA-C        Resp 18   Ht '5\' 10"'$  (1.778 m)   Wt 184 lb (83.5 kg)   BMI 26.40 kg/m       Objective:   Physical Exam  General- No acute distress. Pleasant patient. Neck- Full range of motion, no jvd Lungs- Clear, even and unlabored. Heart- regular rate and rhythm. Neurologic- CNII- XII grossly intact.  Abdomen- soft, nd +bs, no rebound or guarding. No suprapubic tenderness. Mild  llq tenderness to  palpation. Back- no cva tenderness.      Assessment & Plan:   Patient Instructions  Diverticulitis with persisting pain that can be moderate at times. Giving additional 7 days of cipro and flagyl antibiotic. Will give 2 days of norco to help with pain. If pain perisst despite this let us know. On CT showed diverticulitis but no abscess or perforation.  Will get cbc and cmp stat.  Will  follow labs and see how you do. Give me update in 5 days or sooner. Will see if you need to be seen in office. Appointment with gi in June. Will see if appointment needs to be moved up.   Mackie Pai, PA-C

## 2021-11-29 ENCOUNTER — Ambulatory Visit: Payer: Medicare Other | Admitting: Medical

## 2021-12-07 ENCOUNTER — Encounter: Payer: Self-pay | Admitting: Medical

## 2021-12-07 ENCOUNTER — Other Ambulatory Visit: Payer: Self-pay | Admitting: *Deleted

## 2021-12-07 MED ORDER — DEXCOM G6 SENSOR MISC
0 refills | Status: AC
  Filled 2022-10-03: qty 3, 30d supply, fill #0

## 2021-12-07 MED ORDER — DEXCOM G6 TRANSMITTER MISC
0 refills | Status: AC
Start: 2021-12-07 — End: ?

## 2021-12-07 MED ORDER — DEXCOM G6 RECEIVER DEVI
0 refills | Status: AC
Start: 2021-12-07 — End: ?

## 2021-12-07 MED ORDER — ROSUVASTATIN CALCIUM 20 MG PO TABS
20.0000 mg | ORAL_TABLET | Freq: Every day | ORAL | 1 refills | Status: DC
Start: 2021-12-07 — End: 2022-03-02

## 2021-12-27 ENCOUNTER — Encounter: Payer: Self-pay | Admitting: Cardiology

## 2021-12-31 ENCOUNTER — Encounter: Payer: Self-pay | Admitting: Gastroenterology

## 2021-12-31 ENCOUNTER — Ambulatory Visit: Payer: Medicare Other | Admitting: Gastroenterology

## 2021-12-31 VITALS — BP 104/62 | HR 80 | Ht 70.0 in | Wt 179.4 lb

## 2021-12-31 DIAGNOSIS — K5732 Diverticulitis of large intestine without perforation or abscess without bleeding: Secondary | ICD-10-CM

## 2022-01-21 ENCOUNTER — Ambulatory Visit: Payer: Medicare Other | Admitting: Cardiology

## 2022-02-14 ENCOUNTER — Other Ambulatory Visit: Payer: Self-pay | Admitting: Urology

## 2022-02-14 DIAGNOSIS — N401 Enlarged prostate with lower urinary tract symptoms: Secondary | ICD-10-CM

## 2022-02-22 ENCOUNTER — Other Ambulatory Visit: Payer: Self-pay

## 2022-02-22 DIAGNOSIS — N401 Enlarged prostate with lower urinary tract symptoms: Secondary | ICD-10-CM

## 2022-02-22 MED ORDER — TAMSULOSIN HCL 0.4 MG PO CAPS
0.4000 mg | ORAL_CAPSULE | Freq: Two times a day (BID) | ORAL | 0 refills | Status: DC
  Filled 2022-10-03: qty 60, 30d supply, fill #0

## 2022-02-22 NOTE — Progress Notes (Signed)
Patient called office today to inquire about his flomax medication re order- reviewed patient medication list and it appears it was dc'd by another practice. Patient should currently still be on per our provider notes.  Refill submitted for patient.

## 2022-03-02 ENCOUNTER — Other Ambulatory Visit: Payer: Self-pay | Admitting: Student

## 2022-03-09 DIAGNOSIS — C61 Malignant neoplasm of prostate: Secondary | ICD-10-CM | POA: Insufficient documentation

## 2022-03-09 DIAGNOSIS — M1990 Unspecified osteoarthritis, unspecified site: Secondary | ICD-10-CM | POA: Insufficient documentation

## 2022-03-09 DIAGNOSIS — E119 Type 2 diabetes mellitus without complications: Secondary | ICD-10-CM | POA: Insufficient documentation

## 2022-03-09 DIAGNOSIS — T7840XA Allergy, unspecified, initial encounter: Secondary | ICD-10-CM | POA: Insufficient documentation

## 2022-03-10 ENCOUNTER — Encounter: Payer: Self-pay | Admitting: Cardiology

## 2022-03-10 ENCOUNTER — Ambulatory Visit: Payer: Medicare Other | Attending: Cardiology | Admitting: Cardiology

## 2022-03-10 VITALS — BP 118/62 | HR 73 | Ht 69.0 in | Wt 183.1 lb

## 2022-03-10 DIAGNOSIS — I2510 Atherosclerotic heart disease of native coronary artery without angina pectoris: Secondary | ICD-10-CM

## 2022-03-10 DIAGNOSIS — Z1321 Encounter for screening for nutritional disorder: Secondary | ICD-10-CM | POA: Diagnosis not present

## 2022-03-10 DIAGNOSIS — E782 Mixed hyperlipidemia: Secondary | ICD-10-CM

## 2022-03-10 DIAGNOSIS — E119 Type 2 diabetes mellitus without complications: Secondary | ICD-10-CM | POA: Diagnosis not present

## 2022-03-10 DIAGNOSIS — F17210 Nicotine dependence, cigarettes, uncomplicated: Secondary | ICD-10-CM

## 2022-03-10 HISTORY — DX: Nicotine dependence, cigarettes, uncomplicated: F17.210

## 2022-03-10 HISTORY — DX: Mixed hyperlipidemia: E78.2

## 2022-03-10 NOTE — Progress Notes (Signed)
Cardiology Office Note:    Date:  03/10/2022   ID:  Alec [name], DOB 05-30-1956, MRN [id number]  PCP:  Mackie Pai, PA-C  Cardiologist:  Jenean Lindau, MD   Referring MD: Mackie Pai, PA-C    ASSESSMENT:    1. CAD in native artery   2. Type 2 diabetes mellitus without complication, without long-term current use of insulin ([place])   3. Encounter for vitamin deficiency screening   4. Cigarette smoker   5. Mixed dyslipidemia    PLAN:    In order of problems listed above:  Artery disease: Secondary prevention stressed with patient.  Importance of compliance with diet medication stressed any vocalized understanding.  He was advised to walk at least half an hour a day 5 days a week and he promises to do so. Mixed dyslipidemia: Lipids were reviewed.  He is fasting and will have complete blood work today including lipids Diabetes mellitus: Managed by primary care.  Hemoglobin A1c was fine and we will recheck this today.  Diet emphasized. Cigarette smoker: I spent 5 minutes with the patient discussing solely about smoking. Smoking cessation was counseled. I suggested to the patient also different medications and pharmacological interventions. Patient is keen to try stopping on its own at this time. He will get back to me if he needs any further assistance in this matter. We will do complete blood work today including vitamin D with history of deficiency. Patient will be seen in follow-up appointment in 6 months or earlier if the patient has any concerns    Medication Adjustments/Labs and Tests Ordered: Current medicines are reviewed at length with the patient today.  Concerns regarding medicines are outlined above.  Orders Placed This Encounter  Procedures   Basic metabolic panel   CBC   Hemoglobin A1c   Hepatic function panel   Lipid panel   TSH   VITAMIN D 25 Hydroxy (Vit-D Deficiency, Fractures)   EKG 12-Lead   No orders of the defined types were placed in  this encounter.    No chief complaint on file.    History of Present Illness:    Alec [name] is a 66 y.o. male.  Patient has past medical history of coronary artery calcification, mixed dyslipidemia and diabetes mellitus.  He does remodeling and Architect.  He denies any chest pain orthopnea or PND.  He does not exercise on a regular basis.  He is active gentleman.  Unfortunately continues to smoke since young age.  At the time of my evaluation, the patient is alert awake oriented and in no distress.  Past Medical History:  Diagnosis Date   ALLERGIC RHINITIS    Allergy    Anemia 05/06/2014   Arthritis    Back pain 05/01/2014   COLONIC POLYPS, HX OF 05/20/2009   Qualifier: Diagnosis of  By: Asa Lente MD, Jannifer Rodney    Diabetes ([place]) 07/09/2014   Diabetes mellitus without complication ([place])    type 2   Diverticulitis of colon 05/20/2009   Qualifier: Diagnosis of  By: Asa Lente MD, Mateo Flow A    GERD    LEG PAIN, LEFT    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/10/2014   Malignant neoplasm of prostate ([place]) 07/08/2018   Needs smoking cessation education 05/01/2014   Numbness of finger 10/10/2014   Pain in joint, forearm 05/01/2014   Pain in joint, upper arm 05/01/2014   Pain in the abdomen 05/01/2014   Prostate cancer ([place])    SINUSITIS, CHRONIC    SMOKER  Wellness examination 06/02/2014    Past Surgical History:  Procedure Laterality Date   COLONOSCOPY     FINGER SURGERY     PROSTATE BIOPSY     RADIOACTIVE SEED IMPLANT N/A 09/20/2018   Procedure: RADIOACTIVE SEED IMPLANT/BRACHYTHERAPY IMPLANT;  Surgeon: Irine Seal, MD;  Location: [facility];  Service: Urology;  Laterality: N/A;   SPACE OAR INSTILLATION N/A 09/20/2018   Procedure: SPACE OAR INSTILLATION;  Surgeon: Irine Seal, MD;  Location: [facility];  Service: Urology;  Laterality: N/A;   SPINE SURGERY     lumbar surgery 2012.   STOMACH SURGERY      Current Medications: Current Meds  Medication  Sig   Continuous Blood Gluc Receiver (DEXCOM G6 RECEIVER) DEVI Use as directed.  Dx code: E11.9   Continuous Blood Gluc Sensor (DEXCOM G6 SENSOR) MISC Use as directed.  Dx code: E11.9   Continuous Blood Gluc Transmit (DEXCOM G6 TRANSMITTER) MISC Use as directed.  Dx code: E11.9   fluticasone (FLONASE) 50 MCG/ACT nasal spray Place 2 sprays into both nostrils daily.   glucose blood (ONETOUCH VERIO) test strip Use as instructed   HYDROcodone-acetaminophen (NORCO) 5-325 MG tablet Take 1 tablet by mouth every 6 (six) hours as needed for moderate pain.   metFORMIN (GLUCOPHAGE) 500 MG tablet Take 1 tablet (500 mg total) by mouth 2 (two) times daily with a meal.   mupirocin ointment (BACTROBAN) 2 % Apply 1 application topically 2 (two) times daily.   ONETOUCH DELICA LANCETS 33A MISC Use as directed.   rosuvastatin (CRESTOR) 20 MG tablet Take 1 tablet by mouth once daily   sitaGLIPtin (JANUVIA) 25 MG tablet Take 1 tablet (25 mg total) by mouth daily.   tamsulosin (FLOMAX) 0.4 MG CAPS capsule Take 1 capsule (0.4 mg total) by mouth 2 (two) times daily.   Current Facility-Administered Medications for the 03/10/22 encounter (Office Visit) with Shantanique Hodo, Reita Cliche, MD  Medication   hydrocortisone-pramoxine (PROCTOFOAM-HC) rectal foam 1 applicator     Allergies:   Patient has no known allergies.   Social History   Socioeconomic History   Marital status: Married    Spouse name: Not on file   Number of children: Not on file   Years of education: Not on file   Highest education level: Not on file  Occupational History   Occupation: Supervisor   Tobacco Use   Smoking status: Every Day    Packs/day: 0.75    Years: 40.00    Total pack years: 30.00    Types: Cigarettes   Smokeless tobacco: Never   Tobacco comments:    trying to quit, from 1 1/2 ppd to 13-14/day  Vaping Use   Vaping Use: Never used  Substance and Sexual Activity   Alcohol use: No   Drug use: No   Sexual activity: Yes  Other  Topics Concern   Not on file  Social History Narrative   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 There is no history of Colon cancer, Esophageal cancer, Liver cancer,  Pancreatic cancer, Rectal cancer, or Stomach cancer.  ROS:   Please see the history of present illness.    All other systems reviewed and are negative.  EKGs/Labs/Other Studies Reviewed:    The following studies were reviewed today: I discussed my findings with the patient at length.   Recent Labs: 11/25/2021: ALT 40; BUN 21; Creatinine, Ser 0.93; Hemoglobin 11.8; Platelets 213.0; Potassium 4.7; Sodium 137  Recent Lipid Panel    Component Value Date/Time   CHOL 113 10/13/2021 1451   CHOL 100 05/11/2020 0810   TRIG 175.0 (H) 10/13/2021 1451   HDL 35.70 (L) 10/13/2021 1451   HDL 38 (L) 05/11/2020 0810   CHOLHDL 3 10/13/2021 1451   VLDL 35.0 10/13/2021 1451   LDLCALC 42 10/13/2021 1451   LDLCALC 48 05/11/2020 0810   LDLCALC 49 01/31/2020 1516    Physical Exam:    VS:  BP 118/62   Pulse 73   Ht '5\' 9"'$  (1.753 m)   Wt 183 lb 1.3 oz (83 kg)   SpO2 97%   BMI 27.04 kg/m     Wt Readings from Last 3 Encounters:  03/10/22 183 lb 1.3 oz (83 kg)  12/31/21 179 lb 6 oz (81.4 kg)  11/25/21 184 lb (83.5 kg)     GEN: Patient is in no acute distress HEENT: Normal NECK: No JVD; No carotid bruits LYMPHATICS: No lymphadenopathy CARDIAC: Hear sounds regular, 2/6 systolic murmur at the apex. RESPIRATORY:  Clear to auscultation without rales, wheezing or rhonchi  ABDOMEN:  Soft, non-tender, non-distended MUSCULOSKELETAL:  No edema; No deformity  SKIN: Warm and dry NEUROLOGIC:  Alert and oriented x 3 PSYCHIATRIC:  Normal affect   Signed, Jenean Lindau, MD  03/10/2022 8:50 AM    [facility]

## 2022-03-10 NOTE — Patient Instructions (Addendum)
Medication Instructions:  Your physician recommends that you continue on your current medications as directed. Please refer to the Current Medication list given to you today.  *If you need a refill on your cardiac medications before your next appointment, please call your pharmacy*   Lab Work: Your physician recommends that you have labs done in the office today. Your test included  basic metabolic panel, complete blood count, TSH, vitamin d, A1C, liver function and lipids.  [address] in [place]. They also close daily for lunch for 12-1.   [place] [address] 2nd floor M-W 8-11:30 and 1-4:30 and Thursday and Friday 8-11:30.  If you have labs (blood work) drawn today and your tests are completely normal, you will receive your results only by: [place] (if you have MyChart) OR A paper copy in the mail If you have any lab test that is abnormal or we need to change your treatment, we will call you to review the results.   Testing/Procedures: None ordered   Follow-Up: At [facility], you and your health needs are our priority.  As part of our continuing mission to provide you with exceptional heart care, we have created designated Provider Care Teams.  These Care Teams include your primary Cardiologist (physician) and Advanced Practice Providers (APPs -  Physician Assistants and Nurse Practitioners) who all work together to provide you with the care you need, when you need it.  We recommend signing up for the patient portal called "MyChart".  Sign up information is provided on this After Visit Summary.  MyChart is used to connect with patients for Virtual Visits (Telemedicine).  Patients are able to view lab/test results, encounter notes, upcoming appointments, etc.  Non-urgent messages can be sent to your provider as well.   To learn more about what you can do with MyChart, go to [email or web address].    Your next appointment:   6  month(s)  The format for your next appointment:   In Person  Provider:   Jyl Heinz, MD   Other Instructions NA

## 2022-03-11 LAB — BASIC METABOLIC PANEL
BUN/Creatinine Ratio: 17 (ref 10–24)
BUN: 18 mg/dL (ref 8–27)
CO2: 26 mmol/L (ref 20–29)
Calcium: 9.7 mg/dL (ref 8.6–10.2)
Chloride: 103 mmol/L (ref 96–106)
Creatinine, Ser: 1.05 mg/dL (ref 0.76–1.27)
Glucose: 130 mg/dL — ABNORMAL HIGH (ref 70–99)
Potassium: 5.1 mmol/L (ref 3.5–5.2)
Sodium: 142 mmol/L (ref 134–144)
eGFR: 79 mL/min/{1.73_m2} (ref >59)

## 2022-03-11 LAB — LIPID PANEL
Chol/HDL Ratio: 2.3 ratio (ref 0.0–5.0)
Cholesterol, Total: 101 mg/dL (ref 100–199)
HDL: 43 mg/dL (ref >39)
LDL Chol Calc (NIH): 42 mg/dL (ref 0–99)
Triglycerides: 78 mg/dL (ref 0–149)
VLDL Cholesterol Cal: 16 mg/dL (ref 5–40)

## 2022-03-11 LAB — CBC
Hematocrit: 39.6 % (ref 37.5–51.0)
Hemoglobin: 12 g/dL — ABNORMAL LOW (ref 13.0–17.7)
MCH: 19.5 pg — ABNORMAL LOW (ref 26.6–33.0)
MCHC: 30.3 g/dL — ABNORMAL LOW (ref 31.5–35.7)
MCV: 65 fL — ABNORMAL LOW (ref 79–97)
Platelets: 213 10*3/uL (ref 150–450)
RBC: 6.14 x10E6/uL — ABNORMAL HIGH (ref 4.14–5.80)
RDW: 16.4 % — ABNORMAL HIGH (ref 11.6–15.4)
WBC: 6.8 10*3/uL (ref 3.4–10.8)

## 2022-03-11 LAB — HEPATIC FUNCTION PANEL
ALT: 17 IU/L (ref 0–44)
AST: 16 IU/L (ref 0–40)
Albumin: 4.8 g/dL (ref 3.9–4.9)
Alkaline Phosphatase: 68 IU/L (ref 44–121)
Bilirubin Total: 0.8 mg/dL (ref 0.0–1.2)
Bilirubin, Direct: 0.25 mg/dL (ref 0.00–0.40)
Total Protein: 6.9 g/dL (ref 6.0–8.5)

## 2022-03-11 LAB — VITAMIN D 25 HYDROXY (VIT D DEFICIENCY, FRACTURES): Vit D, 25-Hydroxy: 73.7 ng/mL (ref 30.0–100.0)

## 2022-03-11 LAB — HEMOGLOBIN A1C
Est. average glucose Bld gHb Est-mCnc: 140 mg/dL
Hgb A1c MFr Bld: 6.5 % — ABNORMAL HIGH (ref 4.8–5.6)

## 2022-03-11 LAB — TSH: TSH: 0.825 u[IU]/mL (ref 0.450–4.500)

## 2022-03-12 DIAGNOSIS — H524 Presbyopia: Secondary | ICD-10-CM | POA: Diagnosis not present

## 2022-04-06 ENCOUNTER — Telehealth: Payer: Self-pay | Admitting: Medical

## 2022-04-06 NOTE — Addendum Note (Signed)
Addended by: Anabel Halon on: 04/06/2022 08:46 PM   Modules accepted: Orders

## 2022-04-06 NOTE — Telephone Encounter (Signed)
Patient called because [facility] called to advise him he needed to have his A1C test done. Reviewed patient's chart, patient had A1C done 03/10/22 by urologist. Patient wants to schedule his next one with Percell Miller in a couple of months. Can order be placed for A1C test and patient schedule a follow up 1 week later to see Percell Miller?

## 2022-04-07 NOTE — Telephone Encounter (Signed)
Pt called and lvm to return call 

## 2022-04-12 ENCOUNTER — Other Ambulatory Visit: Payer: Self-pay | Admitting: Medical

## 2022-05-03 ENCOUNTER — Other Ambulatory Visit: Payer: Self-pay | Admitting: Medical

## 2022-05-30 NOTE — Progress Notes (Signed)
History of Present Illness:    9.27.2019--underwent ultrasound and biopsy of his prostate.  PSA 4.02, prostate size 30 mL, 2 cores revealed GS 3+3 pattern.  PSAD 0.13.   3.12.2020: He underwent brachytherapy with SpaceOAR instillation    11.21.2023:   Past Medical History:  Diagnosis Date   ALLERGIC RHINITIS    Allergy    Anemia 05/06/2014   Arthritis    Back pain 05/01/2014   COLONIC POLYPS, HX OF 05/20/2009   Qualifier: Diagnosis of  By: Asa Lente MD, Jannifer Rodney    Diabetes ([place]) 07/09/2014   Diabetes mellitus without complication ([place])    type 2   Diverticulitis of colon 05/20/2009   Qualifier: Diagnosis of  By: Asa Lente MD, Valerie A    GERD    LEG PAIN, LEFT    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/10/2014   Malignant neoplasm of prostate ([place]) 07/08/2018   Needs smoking cessation education 05/01/2014   Numbness of finger 10/10/2014   Pain in joint, forearm 05/01/2014   Pain in joint, upper arm 05/01/2014   Pain in the abdomen 05/01/2014   Prostate cancer ([place])    SINUSITIS, CHRONIC    SMOKER    Wellness examination 06/02/2014    Past Surgical History:  Procedure Laterality Date   COLONOSCOPY     FINGER SURGERY     PROSTATE BIOPSY     RADIOACTIVE SEED IMPLANT N/A 09/20/2018   Procedure: RADIOACTIVE SEED IMPLANT/BRACHYTHERAPY IMPLANT;  Surgeon: Irine Seal, MD;  Location: [place];  Service: Urology;  Laterality: N/A;   SPACE OAR INSTILLATION N/A 09/20/2018   Procedure: SPACE OAR INSTILLATION;  Surgeon: Irine Seal, MD;  Location: [facility];  Service: Urology;  Laterality: N/A;   SPINE SURGERY     lumbar surgery 2012.   STOMACH SURGERY      Home Medications:  Allergies as of 05/31/2022   No Known Allergies      Medication List        Accurate as of May 30, 2022  7:44 PM. If you have any questions, ask your nurse or doctor.          Dexcom G6 Receiver Devi Use as directed.  Dx code: E11.9   Dexcom G6 Sensor Misc Use as directed.   Dx code: E11.9   Dexcom G6 Transmitter Misc Use as directed.  Dx code: E11.9   fluticasone 50 MCG/ACT nasal spray Commonly known as: FLONASE Place 2 sprays into both nostrils daily.   glucose blood test strip Commonly known as: OneTouch Verio Use as instructed   HYDROcodone-acetaminophen 5-325 MG tablet Commonly known as: Norco Take 1 tablet by mouth every 6 (six) hours as needed for moderate pain.   hydrocortisone-pramoxine rectal foam Commonly known as: PROCTOFOAM-HC Place 1 applicator rectally 2 (two) times daily.   Januvia 25 MG tablet Generic drug: sitaGLIPtin Take 1 tablet by mouth once daily   metFORMIN 500 MG tablet Commonly known as: GLUCOPHAGE TAKE 1 TABLET BY MOUTH TWICE DAILY WITH A MEAL   mupirocin ointment 2 % Commonly known as: BACTROBAN Apply 1 application topically 2 (two) times daily.   OneTouch Delica Lancets 69V Misc Use as directed.   rosuvastatin 20 MG tablet Commonly known as: CRESTOR Take 1 tablet by mouth once daily   tamsulosin 0.4 MG Caps capsule Commonly known as: FLOMAX Take 1 capsule (0.4 mg total) by mouth 2 (two) times daily.        Allergies: No Known Allergies  Family History  Problem Relation  Age of Onset   Alzheimer's disease Mother    Heart disease Father    Parkinsonism Father    Alcohol abuse Brother    Colon cancer Neg Hx    Esophageal cancer Neg Hx    Liver cancer Neg Hx    Pancreatic cancer Neg Hx    Rectal cancer Neg Hx    Stomach cancer Neg Hx     Social History:  reports that he has been smoking cigarettes. He has a 30.00 pack-year smoking history. He has never used smokeless tobacco. He reports that he does not drink alcohol and does not use drugs.  ROS: A complete review of systems was performed.  All systems are negative except for pertinent findings as noted.  Physical Exam:  Vital signs in last 24 hours: There were no vitals taken for this visit. Constitutional:  Alert and oriented, No acute  distress Cardiovascular: Regular rate  Respiratory: Normal respiratory effort GI: Abdomen is soft, nontender, nondistended, no abdominal masses. No CVAT.  Genitourinary: Normal male phallus, testes are descended bilaterally and non-tender and without masses, scrotum is normal in appearance without lesions or masses, perineum is normal on inspection. Lymphatic: No lymphadenopathy Neurologic: Grossly intact, no focal deficits Psychiatric: Normal mood and affect  I have reviewed prior pt notes  I have reviewed notes from referring/previous physicians  I have reviewed urinalysis results  I have independently reviewed prior imaging  I have reviewed prior PSA results  I have reviewed prior urine culture   Impression/Assessment:  ***  Plan:  ***

## 2022-05-31 ENCOUNTER — Encounter: Payer: Self-pay | Admitting: Urology

## 2022-05-31 ENCOUNTER — Ambulatory Visit (INDEPENDENT_AMBULATORY_CARE_PROVIDER_SITE_OTHER): Payer: Medicare Other | Admitting: Urology

## 2022-05-31 VITALS — BP 139/75 | HR 73 | Ht 69.0 in | Wt 183.0 lb

## 2022-05-31 DIAGNOSIS — R3915 Urgency of urination: Secondary | ICD-10-CM | POA: Diagnosis not present

## 2022-05-31 DIAGNOSIS — Z8546 Personal history of malignant neoplasm of prostate: Secondary | ICD-10-CM

## 2022-05-31 DIAGNOSIS — R300 Dysuria: Secondary | ICD-10-CM

## 2022-06-01 LAB — URINALYSIS, ROUTINE W REFLEX MICROSCOPIC
Bilirubin, UA: NEGATIVE
Glucose, UA: NEGATIVE
Ketones, UA: NEGATIVE
Leukocytes,UA: NEGATIVE
Nitrite, UA: NEGATIVE
Protein,UA: NEGATIVE
RBC, UA: NEGATIVE
Specific Gravity, UA: 1.02 (ref 1.005–1.030)
Urobilinogen, Ur: 1 mg/dL (ref 0.2–1.0)
pH, UA: 6.5 (ref 5.0–7.5)

## 2022-06-01 LAB — PSA: Prostate Specific Ag, Serum: <0.1 ng/mL (ref 0.0–4.0)

## 2022-06-05 ENCOUNTER — Other Ambulatory Visit: Payer: Self-pay | Admitting: Medical

## 2022-06-07 ENCOUNTER — Telehealth: Payer: Self-pay

## 2022-06-07 NOTE — Telephone Encounter (Signed)
Patient aware of MD response to PSA

## 2022-06-07 NOTE — Telephone Encounter (Signed)
-----   Message from Franchot Gallo, MD sent at 06/07/2022  8:58 AM EST ----- Call pt--psa 0 ----- Message ----- From: Audie Box, CMA Sent: 06/01/2022   8:28 AM EST To: Franchot Gallo, MD  Please review PSA

## 2022-06-22 ENCOUNTER — Other Ambulatory Visit: Payer: Self-pay | Admitting: Student

## 2022-07-01 ENCOUNTER — Other Ambulatory Visit: Payer: Self-pay | Admitting: Medical

## 2022-07-29 ENCOUNTER — Other Ambulatory Visit: Payer: Self-pay

## 2022-07-29 ENCOUNTER — Ambulatory Visit
Admission: EM | Admit: 2022-07-29 | Discharge: 2022-07-29 | Disposition: A | Discharge status: Home / Self Care | Payer: Medicare Other | Attending: Nurse Practitioner | Admitting: Nurse Practitioner

## 2022-07-29 ENCOUNTER — Encounter: Payer: Self-pay | Admitting: Emergency Medicine

## 2022-07-29 DIAGNOSIS — K5792 Diverticulitis of intestine, part unspecified, without perforation or abscess without bleeding: Secondary | ICD-10-CM

## 2022-07-29 MED ORDER — AMOXICILLIN-POT CLAVULANATE 875-125 MG PO TABS: 1.0000 | ORAL_TABLET | Freq: Two times a day (BID) | ORAL | 0 refills | Status: DC

## 2022-07-29 NOTE — ED Triage Notes (Addendum)
Pt reports lower abd pain x2 days. Pt reports history of diverticulitis and called PCP this am and reports was unable to be seen.

## 2022-07-29 NOTE — Discharge Instructions (Signed)
As we discussed, it seems like the diverticulitis has returned.  Please take the Augmentin to treat this.  I recommend drinking clear liquids for the next couple of days, then advance your diet to a full liquid diet for a couple of days.  Thereafter, as long as your symptoms are continuing to improve, you can eat solid foods.  Avoid any foods with seeds, nuts, or corn.  Try to avoid NSAIDs as well.  If your symptoms worsen despite treatment, please call 911 or go to the hospital.

## 2022-07-29 NOTE — ED Provider Notes (Signed)
[facility]    CSN: [id number] Arrival date & time: 07/29/22  0843      History   Chief Complaint Chief Complaint  Patient presents with   Abdominal Pain    HPI Alec [name] is a 67 y.o. male.   Patient presents today with lower abdominal pain ongoing for the past couple of days.  Reports the pain is constant, at times there are sharp shooting pains.  The pain is a constant 5 out of 10.  Pain does not radiate to other part of abdomen, down leg, or to back.  Patient denies any known fevers, bodyaches or chills, nausea or vomiting, unexplained weight loss, decreased appetite, new diarrhea, new constipation, blood in the stool, heartburn or indigestion, new rash, dysuria/urinary frequency, and hematuria.  Reports he has taken ibuprofen for the pain yesterday morning which did not really help much.  He does not know what makes the pain better or worse.  Does not think it is worse with eating.  Medical history significant for GERD, diverticulitis, type 2 diabetes, and he is a current smoker.    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) 07/08/2018   Needs smoking cessation education 05/01/2014   Numbness of finger 10/10/2014   Pain in joint, forearm 05/01/2014   Pain in joint, upper arm 05/01/2014   Pain in the abdomen 05/01/2014   Prostate cancer ([place])    SINUSITIS, CHRONIC    SMOKER    Wellness examination 06/02/2014    Patient Active Problem List   Diagnosis Date Noted   Cigarette smoker 03/10/2022   Mixed  dyslipidemia 03/10/2022   Allergy 03/09/2022   Arthritis 03/09/2022   Diabetes mellitus without complication ([place]) 44/09/4740   DIVERTICULITIS, HX OF 03/09/2022   Malignant neoplasm of prostate ([place]) 07/08/2018   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/10/2014   Numbness of finger 10/10/2014   Diabetes ([place]) 07/09/2014   Wellness examination 06/02/2014   Anemia 05/06/2014   Pain in joint, forearm 05/01/2014   Pain in joint, upper arm 05/01/2014   Back pain 05/01/2014   Pain in the abdomen 05/01/2014   Needs smoking cessation education 05/01/2014   SMOKER 05/20/2009   SINUSITIS, CHRONIC 05/20/2009   ALLERGIC RHINITIS 05/20/2009   GERD 05/20/2009   LEG PAIN, LEFT 05/20/2009   COLONIC POLYPS, HX OF 05/20/2009   Diverticulitis of colon 05/20/2009    Past Surgical History:  Procedure Laterality Date   COLONOSCOPY     FINGER SURGERY     PROSTATE BIOPSY     RADIOACTIVE SEED IMPLANT N/A 09/20/2018   Procedure: RADIOACTIVE SEED IMPLANT/BRACHYTHERAPY IMPLANT;  Surgeon: Irine Seal, MD;  Location: [facility];  Service: Urology;  Laterality: N/A;   SPACE OAR INSTILLATION N/A 09/20/2018   Procedure: SPACE OAR INSTILLATION;  Surgeon: Irine Seal, MD;  Location: [facility];  Service: Urology;  Laterality: N/A;   SPINE SURGERY     lumbar  surgery 2012.   STOMACH SURGERY         Home Medications    Prior to Admission medications   Medication Sig Start Date End Date Taking? Authorizing Provider  amoxicillin-clavulanate (AUGMENTIN) 875-125 MG tablet Take 1 tablet by mouth every 12 (twelve) hours. 07/29/22  Yes Eulogio Bear, NP  Continuous Blood Gluc Receiver (DEXCOM G6 RECEIVER) DEVI Use as directed.  Dx code: E11.9 12/07/21   Saguier, Percell Miller, PA-C  Continuous Blood Gluc Sensor (DEXCOM G6 SENSOR) MISC Use as directed.  Dx code: E11.9 12/07/21   Saguier, Percell Miller, PA-C  Continuous Blood Gluc Transmit (DEXCOM G6 TRANSMITTER) MISC Use as directed.  Dx code: E11.9 12/07/21    Saguier, Percell Miller, PA-C  fluticasone [facility]) 50 MCG/ACT nasal spray Place 2 sprays into both nostrils daily. 06/26/21   Chevis Pretty, FNP  glucose blood [facility] VERIO) test strip Use as instructed 08/13/14   Saguier, Percell Miller, PA-C  HYDROcodone-acetaminophen (NORCO) 5-325 MG tablet Take 1 tablet by mouth every 6 (six) hours as needed for moderate pain. 11/25/21   Saguier, Percell Miller, PA-C  hydrocortisone-pramoxine [facility]) rectal foam Place 1 applicator rectally 2 (two) times daily. 11/25/19   Saguier, Percell Miller, PA-C  JANUVIA 25 MG tablet Take 1 tablet by mouth once daily 07/01/22   Saguier, Percell Miller, PA-C  metFORMIN (GLUCOPHAGE) 500 MG tablet TAKE 1 TABLET BY MOUTH TWICE DAILY WITH A MEAL 07/01/22   Saguier, Percell Miller, PA-C  mupirocin ointment (BACTROBAN) 2 % Apply 1 application topically 2 (two) times daily. 02/24/20   Wurst, [place], PA-C  ONETOUCH DELICA LANCETS 16X MISC Use as directed. 07/30/14   Saguier, Percell Miller, PA-C  rosuvastatin (CRESTOR) 20 MG tablet Take 1 tablet by mouth once daily 06/22/22   Revankar, Reita Cliche, MD  tamsulosin (FLOMAX) 0.4 MG CAPS capsule Take 1 capsule (0.4 mg total) by mouth 2 (two) times daily. 02/22/22   Franchot Gallo, MD    Family History Family History  Problem Relation Age of Onset   Alzheimer's disease Mother    Heart disease Father    Parkinsonism Father    Alcohol abuse Brother    Colon cancer Neg Hx    Esophageal cancer Neg Hx    Liver cancer Neg Hx    Pancreatic cancer Neg Hx    Rectal cancer Neg Hx    Stomach cancer Neg Hx     Social History Social History   Tobacco Use   Smoking status: Every Day    Packs/day: 0.75    Years: 40.00    Total pack years: 30.00    Types: Cigarettes   Smokeless tobacco: Never   Tobacco comments:    trying to quit, from 1 1/2 ppd to 13-14/day  Vaping Use   Vaping Use: Never used  Substance Use Topics   Alcohol use: No   Drug use: No     Allergies   Patient has no known allergies.   Review of  Systems Review of Systems Per HPI  Physical Exam Triage Vital Signs ED Triage Vitals  Enc Vitals Group     BP 07/29/22 0853 129/69     Pulse Rate 07/29/22 0853 94     Resp 07/29/22 0853 20     Temp 07/29/22 0853 98 F (36.7 C)     Temp Source 07/29/22 0853 Oral     SpO2 07/29/22 0853 95 %     Weight --      Height --      Head Circumference --  Peak Flow --      Pain Score 07/29/22 0856 4     Pain Loc --      Pain Edu? --      Excl. in [place]? --    No data found.  Updated Vital Signs BP 129/69 (BP Location: Right Arm)   Pulse 94   Temp 98 F (36.7 C) (Oral)   Resp 20   SpO2 95%   Visual Acuity Right Eye Distance:   Left Eye Distance:   Bilateral Distance:    Right Eye Near:   Left Eye Near:    Bilateral Near:     Physical Exam Vitals and nursing note reviewed.  Constitutional:      General: He is not in acute distress.    Appearance: Normal appearance. He is not toxic-appearing.  HENT:     Head: Normocephalic and atraumatic.     Mouth/Throat:     Mouth: Mucous membranes are moist.     Pharynx: Oropharynx is clear. No posterior oropharyngeal erythema.  Cardiovascular:     Rate and Rhythm: Normal rate and regular rhythm.  Pulmonary:     Effort: Pulmonary effort is normal. No respiratory distress.     Breath sounds: Normal breath sounds. No wheezing, rhonchi or rales.  Abdominal:     General: Abdomen is flat. Bowel sounds are normal. There is no distension.     Palpations: Abdomen is soft.     Tenderness: There is abdominal tenderness in the left lower quadrant. There is no right CVA tenderness, left CVA tenderness, guarding or rebound. Negative signs include Murphy's sign, Rovsing's sign and McBurney's sign.     Comments: Abdomen generally tender, pain worst in LLQ.  No guarding or rebound tenderness.  Musculoskeletal:     Cervical back: Normal range of motion.  Lymphadenopathy:     Cervical: No cervical adenopathy.  Skin:    General: Skin is warm  and dry.     Capillary Refill: Capillary refill takes less than 2 seconds.     Coloration: Skin is not jaundiced or pale.     Findings: No erythema.  Neurological:     Mental Status: He is alert.     Motor: No weakness.     Gait: Gait normal.  Psychiatric:        Behavior: Behavior is cooperative.      UC Treatments / Results  Labs (all labs ordered are listed, but only abnormal results are displayed) Labs Reviewed - No data to display  EKG   Radiology No results found.  Procedures Procedures (including critical care time)  Medications Ordered in UC Medications - No data to display  Initial Impression / Assessment and Plan / UC Course  I have reviewed the triage vital signs and the nursing notes.  Pertinent labs & imaging results that were available during my care of the patient were reviewed by me and considered in my medical decision making (see chart for details).   Patient is well-appearing, normotensive, afebrile, not tachycardic, not tachypneic, oxygenating well on room air.    Diverticulitis Given history of diverticulitis, pain worse in left lower quadrant with palpation, I am highly suspicious for diverticulitis Discussed clear liquid diet, advancing slowly as tolerated based on pain Also start Augmentin to treat infection Discussed avoidance of seeds, nuts, high-fiber foods Follow-up with gastroenterologist if symptoms persist despite treatment Strict ER precautions discussed with patient  The patient was given the opportunity to ask questions.  All questions answered to their  satisfaction.  The patient is in agreement to this plan.    Final Clinical Impressions(s) / UC Diagnoses   Final diagnoses:  Diverticulitis     Discharge Instructions      As we discussed, it seems like the diverticulitis has returned.  Please take the Augmentin to treat this.  I recommend drinking clear liquids for the next couple of days, then advance your diet to a full  liquid diet for a couple of days.  Thereafter, as long as your symptoms are continuing to improve, you can eat solid foods.  Avoid any foods with seeds, nuts, or corn.  Try to avoid NSAIDs as well.  If your symptoms worsen despite treatment, please call 911 or go to the hospital.     ED Prescriptions     Medication Sig Dispense Auth. Provider   amoxicillin-clavulanate (AUGMENTIN) 875-125 MG tablet Take 1 tablet by mouth every 12 (twelve) hours. 14 tablet Eulogio Bear, NP      PDMP not reviewed this encounter.   Eulogio Bear, NP 07/29/22 [phone]

## 2022-08-04 ENCOUNTER — Telehealth: Payer: Self-pay | Admitting: Medical

## 2022-08-04 NOTE — Telephone Encounter (Signed)
Copied from [place] [id number]. Topic: Medicare AWV >> Aug 04, 2022  9:50 AM Devoria Glassing wrote: Reason for CRM: Left message for patient to schedule Annual Wellness Visit(AWV).  Please schedule with Health Nurse Advisor at [facility]. Please call [phone] ask for [facility].

## 2022-08-12 ENCOUNTER — Ambulatory Visit
Admission: EM | Admit: 2022-08-12 | Discharge: 2022-08-12 | Disposition: A | Discharge status: Home / Self Care | Payer: Medicare Other | Attending: Nurse Practitioner | Admitting: Nurse Practitioner

## 2022-08-12 DIAGNOSIS — B349 Viral infection, unspecified: Secondary | ICD-10-CM | POA: Diagnosis not present

## 2022-08-12 DIAGNOSIS — Z1152 Encounter for screening for COVID-19: Secondary | ICD-10-CM | POA: Diagnosis not present

## 2022-08-12 MED ORDER — PROMETHAZINE-DM 6.25-15 MG/5ML PO SYRP: 5.0000 mL | ORAL_SOLUTION | Freq: Four times a day (QID) | ORAL | 0 refills | Status: DC | PRN

## 2022-08-12 MED ORDER — FLUTICASONE PROPIONATE 50 MCG/ACT NA SUSP: 2.0000 | Freq: Every day | NASAL | 0 refills | Status: DC

## 2022-08-12 NOTE — ED Triage Notes (Signed)
Pt reports his wife has covid  and he was exposed x 1 day. Pt states he hasn't been feeling well for a while . Has some coughing, diarrhea, congestion, and fatigue

## 2022-08-12 NOTE — ED Provider Notes (Signed)
[facility]    CSN: [id number] Arrival date & time: 08/12/22  1143      History   Chief Complaint Chief Complaint  Patient presents with   Cold Exposure    HPI Alec [name] is a 67 y.o. male.   The history is provided by the patient.   Patient presents for complaints of fatigue, nasal congestion, runny nose, cough, and diarrhea.  Patient denies fever, chills, sore throat, headache, abdominal pain, nausea or vomiting.  Patient reports that his wife was diagnosed with COVID 24 hours ago.  He states that he has since developed symptoms.  Patient reports he has not been vaccinated for COVID.  He states he has been taking ibuprofen for his symptoms.  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   Malignant neoplasm of prostate ([place]) 07/08/2018   Needs smoking cessation education 05/01/2014   Numbness of finger 10/10/2014   Pain in joint, forearm 05/01/2014   Pain in joint, upper arm 05/01/2014   Pain in the abdomen 05/01/2014   Prostate cancer ([place])    SINUSITIS, CHRONIC    SMOKER    Wellness examination 06/02/2014    Patient Active Problem List   Diagnosis Date Noted   Cigarette smoker 03/10/2022   Mixed dyslipidemia 03/10/2022   Allergy 03/09/2022   Arthritis 03/09/2022   Diabetes mellitus without complication ([place]) 41/74/0814   DIVERTICULITIS, HX OF 03/09/2022   Malignant neoplasm of prostate ([place]) 07/08/2018   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/10/2014   Numbness of finger 10/10/2014   Diabetes ([place]) 07/09/2014   Wellness examination 06/02/2014   Anemia  05/06/2014   Pain in joint, forearm 05/01/2014   Pain in joint, upper arm 05/01/2014   Back pain 05/01/2014   Pain in the abdomen 05/01/2014   Needs smoking cessation education 05/01/2014   SMOKER 05/20/2009   SINUSITIS, CHRONIC 05/20/2009   ALLERGIC RHINITIS 05/20/2009   GERD 05/20/2009   LEG PAIN, LEFT 05/20/2009   COLONIC POLYPS, HX OF 05/20/2009   Diverticulitis of colon 05/20/2009    Past Surgical History:  Procedure Laterality Date   COLONOSCOPY     FINGER SURGERY     PROSTATE BIOPSY     RADIOACTIVE SEED IMPLANT N/A 09/20/2018   Procedure: RADIOACTIVE SEED IMPLANT/BRACHYTHERAPY IMPLANT;  Surgeon: Irine Seal, MD;  Location: [facility];  Service: Urology;  Laterality: N/A;   SPACE OAR INSTILLATION N/A 09/20/2018   Procedure: SPACE OAR INSTILLATION;  Surgeon: Irine Seal, MD;  Location: [facility];  Service: Urology;  Laterality: N/A;   SPINE SURGERY     lumbar surgery 2012.   STOMACH SURGERY         Home Medications    Prior to Admission medications   Medication Sig Start Date End Date Taking? Authorizing Provider  fluticasone (FLONASE) 50 MCG/ACT nasal spray Place 2 sprays into both nostrils daily. 08/12/22  Yes Taysean Wager-Warren, Alda Lea, NP  promethazine-dextromethorphan (PROMETHAZINE-DM) 6.25-15 MG/5ML syrup Take 5 mLs  by mouth 4 (four) times daily as needed for cough. 08/12/22  Yes Izack Hoogland-Warren, Alda Lea, NP  amoxicillin-clavulanate (AUGMENTIN) 875-125 MG tablet Take 1 tablet by mouth every 12 (twelve) hours. 07/29/22   Eulogio Bear, NP  Continuous Blood Gluc Receiver (DEXCOM G6 RECEIVER) DEVI Use as directed.  Dx code: E11.9 12/07/21   Saguier, Percell Miller, PA-C  Continuous Blood Gluc Sensor (DEXCOM G6 SENSOR) MISC Use as directed.  Dx code: E11.9 12/07/21   Saguier, Percell Miller, PA-C  Continuous Blood Gluc Transmit (DEXCOM G6 TRANSMITTER) MISC Use as directed.  Dx code: E11.9 12/07/21   Saguier, Percell Miller, PA-C  glucose blood [facility] VERIO)  test strip Use as instructed 08/13/14   Saguier, Percell Miller, PA-C  HYDROcodone-acetaminophen (NORCO) 5-325 MG tablet Take 1 tablet by mouth every 6 (six) hours as needed for moderate pain. 11/25/21   Saguier, Percell Miller, PA-C  hydrocortisone-pramoxine [facility]) rectal foam Place 1 applicator rectally 2 (two) times daily. 11/25/19   Saguier, Percell Miller, PA-C  JANUVIA 25 MG tablet Take 1 tablet by mouth once daily 07/01/22   Saguier, Percell Miller, PA-C  metFORMIN (GLUCOPHAGE) 500 MG tablet TAKE 1 TABLET BY MOUTH TWICE DAILY WITH A MEAL 07/01/22   Saguier, Percell Miller, PA-C  mupirocin ointment (BACTROBAN) 2 % Apply 1 application topically 2 (two) times daily. 02/24/20   Wurst, [place], PA-C  ONETOUCH DELICA LANCETS 81E MISC Use as directed. 07/30/14   Saguier, Percell Miller, PA-C  rosuvastatin (CRESTOR) 20 MG tablet Take 1 tablet by mouth once daily 06/22/22   Revankar, Reita Cliche, MD  tamsulosin (FLOMAX) 0.4 MG CAPS capsule Take 1 capsule (0.4 mg total) by mouth 2 (two) times daily. 02/22/22   Franchot Gallo, MD    Family History Family History  Problem Relation Age of Onset   Alzheimer's disease Mother    Heart disease Father    Parkinsonism Father    Alcohol abuse Brother    Colon cancer Neg Hx    Esophageal cancer Neg Hx    Liver cancer Neg Hx    Pancreatic cancer Neg Hx    Rectal cancer Neg Hx    Stomach cancer Neg Hx     Social History Social History   Tobacco Use   Smoking status: Every Day    Packs/day: 0.75    Years: 40.00    Total pack years: 30.00    Types: Cigarettes   Smokeless tobacco: Never   Tobacco comments:    trying to quit, from 1 1/2 ppd to 13-14/day  Vaping Use   Vaping Use: Never used  Substance Use Topics   Alcohol use: No   Drug use: No     Allergies   Patient has no known allergies.   Review of Systems Review of Systems Per HPI  Physical Exam Triage Vital Signs ED Triage Vitals  Enc Vitals Group     BP 08/12/22 1227 121/74     Pulse Rate 08/12/22 1227 69     Resp  08/12/22 1227 20     Temp 08/12/22 1227 (!) 97.5 F (36.4 C)     Temp Source 08/12/22 1227 Oral     SpO2 08/12/22 1227 95 %     Weight --      Height --      Head Circumference --      Peak Flow --      Pain Score 08/12/22 1228 0     Pain Loc --      Pain Edu? --      Excl. in  [place]? --    No data found.  Updated Vital Signs BP 121/74 (BP Location: Right Arm)   Pulse 69   Temp (!) 97.5 F (36.4 C) (Oral)   Resp 20   SpO2 95%   Visual Acuity Right Eye Distance:   Left Eye Distance:   Bilateral Distance:    Right Eye Near:   Left Eye Near:    Bilateral Near:     Physical Exam Vitals and nursing note reviewed.  Constitutional:      General: He is not in acute distress.    Appearance: Normal appearance.  HENT:     Head: Normocephalic.     Right Ear: Tympanic membrane, ear canal and external ear normal.     Left Ear: Tympanic membrane, ear canal and external ear normal.     Nose: Congestion and rhinorrhea present.     Right Turbinates: Enlarged and swollen.     Left Turbinates: Enlarged and swollen.     Right Sinus: No maxillary sinus tenderness or frontal sinus tenderness.     Left Sinus: No maxillary sinus tenderness or frontal sinus tenderness.     Mouth/Throat:     Lips: Pink.     Mouth: Mucous membranes are moist.     Pharynx: Uvula midline. Posterior oropharyngeal erythema present. No pharyngeal swelling or oropharyngeal exudate.  Eyes:     Extraocular Movements: Extraocular movements intact.     Conjunctiva/sclera: Conjunctivae normal.     Pupils: Pupils are equal, round, and reactive to light.  Cardiovascular:     Rate and Rhythm: Normal rate and regular rhythm.     Pulses: Normal pulses.     Heart sounds: Normal heart sounds.  Pulmonary:     Effort: Pulmonary effort is normal. No respiratory distress.     Breath sounds: Normal breath sounds. No stridor. No wheezing, rhonchi or rales.  Abdominal:     General: Bowel sounds are normal.     Palpations:  Abdomen is soft.     Tenderness: There is no abdominal tenderness.  Musculoskeletal:     Cervical back: Normal range of motion.  Lymphadenopathy:     Cervical: No cervical adenopathy.  Skin:    General: Skin is warm and dry.  Neurological:     General: No focal deficit present.     Mental Status: He is alert and oriented to person, place, and time.  Psychiatric:        Mood and Affect: Mood normal.        Behavior: Behavior normal.      UC Treatments / Results  Labs (all labs ordered are listed, but only abnormal results are displayed) Labs Reviewed  SARS CORONAVIRUS 2 (TAT 6-24 HRS)    EKG   Radiology No results found.  Procedures Procedures (including critical care time)  Medications Ordered in UC Medications - No data to display  Initial Impression / Assessment and Plan / UC Course  I have reviewed the triage vital signs and the nursing notes.  Pertinent labs & imaging results that were available during my care of the patient were reviewed by me and considered in my medical decision making (see chart for details).  The patient is well-appearing, he is in no acute distress, vital signs are stable.  COVID test is pending.  Patient is a candidate to receive molnupiravir if his COVID test is positive.  Will treat patient symptomatically with Promethazine DM for his cough and fluticasone 50 mcg nasal spray for his nasal  congestion.  Supportive care recommendations were provided to the patient along with providing information regarding isolation.  Patient was given strict indications of when to follow-up in the emergency department.  Recommended that he follow-up with his primary care physician for further evaluation.  Patient verbalizes understanding.  All questions were answered.  Patient stable for discharge.   Final Clinical Impressions(s) / UC Diagnoses   Final diagnoses:  Encounter for screening for COVID-19  Viral illness     Discharge Instructions       COVID test is pending.  You will be contacted if your test result is positive.  If your test is positive, you are candidate to receive molnupiravir as antiviral therapy. Take medication as prescribed. Increase fluids and allow for plenty of rest. Recommend using a humidifier in your bedroom at nighttime during sleep to help with cough and sleeping elevated on pillows while symptoms persist. As discussed, please remain in isolation for the next 5 days until you complete the antiviral if your COVID test is positive.  Once you have completed the antiviral therapy, if you continue to have symptoms, please wear your mask for an additional 5 days. Go to the emergency department immediately if you experience shortness of breath, difficulty breathing, or have other concerns. Please follow-up with your primary care physician as needed.     ED Prescriptions     Medication Sig Dispense Auth. Provider   fluticasone (FLONASE) 50 MCG/ACT nasal spray Place 2 sprays into both nostrils daily. 16 g Desmon Hitchner-Warren, Alda Lea, NP   promethazine-dextromethorphan (PROMETHAZINE-DM) 6.25-15 MG/5ML syrup Take 5 mLs by mouth 4 (four) times daily as needed for cough. 118 mL Enaya Howze-Warren, Alda Lea, NP      PDMP not reviewed this encounter.   Tish Men, NP 08/12/22 1311

## 2022-08-12 NOTE — Discharge Instructions (Signed)
COVID test is pending.  You will be contacted if your test result is positive.  If your test is positive, you are candidate to receive molnupiravir as antiviral therapy. Take medication as prescribed. Increase fluids and allow for plenty of rest. Recommend using a humidifier in your bedroom at nighttime during sleep to help with cough and sleeping elevated on pillows while symptoms persist. As discussed, please remain in isolation for the next 5 days until you complete the antiviral if your COVID test is positive.  Once you have completed the antiviral therapy, if you continue to have symptoms, please wear your mask for an additional 5 days. Go to the emergency department immediately if you experience shortness of breath, difficulty breathing, or have other concerns. Please follow-up with your primary care physician as needed.

## 2022-08-13 LAB — SARS CORONAVIRUS 2 (TAT 6-24 HRS): SARS Coronavirus 2: NEGATIVE

## 2022-08-15 ENCOUNTER — Other Ambulatory Visit: Payer: Self-pay | Admitting: Medical

## 2022-09-08 ENCOUNTER — Ambulatory Visit: Payer: Medicare Other | Attending: Cardiology | Admitting: Cardiology

## 2022-09-08 ENCOUNTER — Telehealth: Payer: Self-pay | Admitting: Medical

## 2022-09-08 ENCOUNTER — Encounter: Payer: Self-pay | Admitting: Cardiology

## 2022-09-08 VITALS — BP 134/58 | HR 74 | Ht 69.6 in | Wt 191.1 lb

## 2022-09-08 DIAGNOSIS — I2510 Atherosclerotic heart disease of native coronary artery without angina pectoris: Secondary | ICD-10-CM | POA: Diagnosis not present

## 2022-09-08 DIAGNOSIS — E119 Type 2 diabetes mellitus without complications: Secondary | ICD-10-CM

## 2022-09-08 DIAGNOSIS — F17200 Nicotine dependence, unspecified, uncomplicated: Secondary | ICD-10-CM

## 2022-09-08 DIAGNOSIS — E782 Mixed hyperlipidemia: Secondary | ICD-10-CM | POA: Diagnosis not present

## 2022-09-08 DIAGNOSIS — F17210 Nicotine dependence, cigarettes, uncomplicated: Secondary | ICD-10-CM

## 2022-09-08 HISTORY — DX: Atherosclerotic heart disease of native coronary artery without angina pectoris: I25.10

## 2022-09-08 NOTE — Addendum Note (Signed)
Addended by: Anabel Halon on: 09/08/2022 06:00 PM   Modules accepted: Orders

## 2022-09-08 NOTE — Addendum Note (Signed)
Addended by: Anabel Halon on: 09/08/2022 06:03 PM   Modules accepted: Orders

## 2022-09-08 NOTE — Progress Notes (Signed)
Cardiology Office Note:    Date:  09/08/2022   ID:  Alec [name], DOB January 25, 1956, MRN [id number]  PCP:  Mackie Pai, PA-C  Cardiologist:  Jenean Lindau, MD   Referring MD: Mackie Pai, PA-C    ASSESSMENT:    1. Diabetes mellitus without complication ([place])   2. SMOKER   3. Cigarette smoker   4. Coronary artery disease involving native coronary artery of native heart without angina pectoris   5. Mixed dyslipidemia    PLAN:    In order of problems listed above:  Coronary artery disease: Secondary prevention stressed with the patient.  Importance of compliance with diet medication stressed any vocalized understanding.  He was advised to walk at least half an hour a day 5 days a week and he promises to do so. Essential hypertension: Blood pressure stable and diet was emphasized.  Lifestyle modification urged. Mixed dyslipidemia: On lipid-lowering medications.  Will check lipids today as he is fasting. Diabetes mellitus: Managed by primary care.  Will do hemoglobin A1c with other blood work.  Diet emphasized. Cigarette smoker: I spent 5 minutes with the patient discussing solely about smoking. Smoking cessation was counseled. I suggested to the patient also different medications and pharmacological interventions. Patient is keen to try stopping on its own at this time. He will get back to me if he needs any further assistance in this matter. Patient will be seen in follow-up appointment in 6 months or earlier if the patient has any concerns    Medication Adjustments/Labs and Tests Ordered: Current medicines are reviewed at length with the patient today.  Concerns regarding medicines are outlined above.  No orders of the defined types were placed in this encounter.  No orders of the defined types were placed in this encounter.    No chief complaint on file.    History of Present Illness:    Alec [name] is a 67 y.o. male.  Patient has past medical  history of coronary artery disease, essential hypertension, mixed dyslipidemia, diabetes mellitus and unfortunately continues to smoke.  He denies any chest pain orthopnea or PND.  He is an active gentleman.  He does not exercise on a regular basis.  At the time of my evaluation, the patient is alert awake oriented and in no distress.  Past Medical History:  Diagnosis Date   ALLERGIC RHINITIS    Allergy    Anemia 05/06/2014   Arthritis    Back pain 05/01/2014   Cigarette smoker 03/10/2022   COLONIC POLYPS, HX OF 05/20/2009   Qualifier: Diagnosis of  By: Asa Lente MD, Jannifer Rodney    Diabetes ([place]) 07/09/2014   Diabetes mellitus without complication ([place])    type 2   Diverticulitis of colon 05/20/2009   Qualifier: Diagnosis of  By: Asa Lente MD, Valerie A    GERD    LEG PAIN, LEFT    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/10/2014   Malignant neoplasm of prostate ([place]) 07/08/2018   Mixed dyslipidemia 03/10/2022   Needs smoking cessation education 05/01/2014   Numbness of finger 10/10/2014   Pain in joint, forearm 05/01/2014   Pain in joint, upper arm 05/01/2014   Pain in the abdomen 05/01/2014   Prostate cancer ([place])    SINUSITIS, CHRONIC    SMOKER    Wellness examination 06/02/2014    Past Surgical History:  Procedure Laterality Date   COLONOSCOPY     FINGER SURGERY     PROSTATE BIOPSY     RADIOACTIVE SEED IMPLANT  N/A 09/20/2018   Procedure: RADIOACTIVE SEED IMPLANT/BRACHYTHERAPY IMPLANT;  Surgeon: Irine Seal, MD;  [facility];  Service: Urology;  Laterality: N/A;   SPACE OAR INSTILLATION N/A 09/20/2018   Procedure: SPACE OAR INSTILLATION;  Surgeon: Irine Seal, MD;  [facility]: [facility];  Service: Urology;  Laterality: N/A;   SPINE SURGERY     lumbar surgery 2012.   STOMACH SURGERY      Current Medications: Current Meds  Medication Sig   amoxicillin-clavulanate (AUGMENTIN) 875-125 MG tablet Take 1 tablet by mouth every 12 (twelve) hours.   Continuous  Blood Gluc Receiver (DEXCOM G6 RECEIVER) DEVI Use as directed.  Dx code: E11.9   Continuous Blood Gluc Sensor (DEXCOM G6 SENSOR) MISC Use as directed.  Dx code: E11.9   Continuous Blood Gluc Transmit (DEXCOM G6 TRANSMITTER) MISC Use as directed.  Dx code: E11.9   fluticasone (FLONASE) 50 MCG/ACT nasal spray Place 2 sprays into both nostrils daily.   glucose blood (ONETOUCH VERIO) test strip Use as instructed   HYDROcodone-acetaminophen (NORCO) 5-325 MG tablet Take 1 tablet by mouth every 6 (six) hours as needed for moderate pain.   hydrocortisone-pramoxine (PROCTOFOAM-HC) rectal foam Place 1 applicator rectally 2 (two) times daily.   JANUVIA 25 MG tablet Take 1 tablet by mouth once daily   metFORMIN (GLUCOPHAGE) 500 MG tablet TAKE 1 TABLET BY MOUTH TWICE DAILY WITH A MEAL   mupirocin ointment (BACTROBAN) 2 % Apply 1 application topically 2 (two) times daily.   ONETOUCH DELICA LANCETS 99991111 MISC Use as directed.   promethazine-dextromethorphan (PROMETHAZINE-DM) 6.25-15 MG/5ML syrup Take 5 mLs by mouth 4 (four) times daily as needed for cough.   rosuvastatin (CRESTOR) 20 MG tablet Take 1 tablet by mouth once daily   tamsulosin (FLOMAX) 0.4 MG CAPS capsule Take 1 capsule (0.4 mg total) by mouth 2 (two) times daily.   Current Facility-Administered Medications for the 09/08/22 encounter (Office Visit) with Siddh Vandeventer, Reita Cliche, MD  Medication   hydrocortisone-pramoxine (PROCTOFOAM-HC) rectal foam 1 applicator     Allergies:   Patient has no known allergies.   Social History   Socioeconomic History   Marital status: Married    Spouse name: Not on file   Number of children: Not on file   Years of education: Not on file   Highest education level: Not on file  Occupational History   Occupation: Supervisor   Tobacco Use   Smoking status: Every Day    Packs/day: 0.75    Years: 40.00    Total pack years: 30.00    Types: Cigarettes   Smokeless tobacco: Never   Tobacco comments:    trying to  quit, from 1 1/2 ppd to 13-14/day  Vaping Use   Vaping Use: Never used  Substance and Sexual Activity   Alcohol use: No   Drug use: No   Sexual activity: Yes  Other Topics Concern   Not on file  Social History Narrative   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 There is no history  of Colon cancer, Esophageal cancer, Liver cancer, Pancreatic cancer, Rectal cancer, or Stomach cancer.  ROS:   Please see the history of present illness.    All other systems reviewed and are negative.  EKGs/Labs/Other Studies Reviewed:    The following studies were reviewed today: EKG reveals sinus rhythm and nonspecific ST-T changes   Recent Labs: 03/10/2022: ALT 17; BUN 18; Creatinine, Ser 1.05; Hemoglobin 12.0; Platelets 213; Potassium 5.1; Sodium 142; TSH 0.825  Recent Lipid Panel    Component Value Date/Time   CHOL 101 03/10/2022 0931   TRIG 78 03/10/2022 0931   HDL 43 03/10/2022 0931   CHOLHDL 2.3 03/10/2022 0931   CHOLHDL 3 10/13/2021 1451   VLDL 35.0 10/13/2021 1451   LDLCALC 42 03/10/2022 0931   LDLCALC 49 01/31/2020 1516    Physical Exam:    VS:  BP (!) 134/58   Pulse 74   Ht 5' 9.6" (1.768 m)   Wt 191 lb 1.3 oz (86.7 kg)   SpO2 96%   BMI 27.73 kg/m     Wt Readings from Last 3 Encounters:  09/08/22 191 lb 1.3 oz (86.7 kg)  05/31/22 183 lb (83 kg)  03/10/22 183 lb 1.3 oz (83 kg)     GEN: Patient is in no acute distress HEENT: Normal NECK: No JVD; No carotid bruits LYMPHATICS: No lymphadenopathy CARDIAC: Hear sounds  regular, 2/6 systolic murmur at the apex. RESPIRATORY:  Clear to auscultation without rales, wheezing or rhonchi  ABDOMEN: Soft, non-tender, non-distended MUSCULOSKELETAL:  No edema; No deformity  SKIN: Warm and dry NEUROLOGIC:  Alert and oriented x 3 PSYCHIATRIC:  Normal affect   Signed, Jenean Lindau, MD  09/08/2022 9:51 AM    [place]

## 2022-09-08 NOTE — Telephone Encounter (Signed)
Pt came in office stating wanting to get A1c done but there is no orders, pt stated needed labs done before getting an appt with provider- that usually it is what provider does for him. Please advise so pt can schedule a lab appt and then an OV with provider.

## 2022-09-08 NOTE — Patient Instructions (Signed)
Medication Instructions:  Your physician recommends that you continue on your current medications as directed. Please refer to the Current Medication list given to you today.  *If you need a refill on your cardiac medications before your next appointment, please call your pharmacy*   Lab Work: Your physician recommends that you have labs done in the office today. Your test included complete metabolic panel, complete blood count, TSH, A1C and lipids.  If you have labs (blood work) drawn today and your tests are completely normal, you will receive your results only by: [place] (if you have MyChart) OR A paper copy in the mail If you have any lab test that is abnormal or we need to change your treatment, we will call you to review the results.   Testing/Procedures: None ordered   Follow-Up: At [facility], you and your health needs are our priority.  As part of our continuing mission to provide you with exceptional heart care, we have created designated Provider Care Teams.  These Care Teams include your primary Cardiologist (physician) and Advanced Practice Providers (APPs -  Physician Assistants and Nurse Practitioners) who all work together to provide you with the care you need, when you need it.  We recommend signing up for the patient portal called "MyChart".  Sign up information is provided on this After Visit Summary.  MyChart is used to connect with patients for Virtual Visits (Telemedicine).  Patients are able to view lab/test results, encounter notes, upcoming appointments, etc.  Non-urgent messages can be sent to your provider as well.   To learn more about what you can do with MyChart, go to [email or web address].    Your next appointment:   9 month(s)  The format for your next appointment:   In Person  Provider:   Jyl Heinz, MD    Other Instructions none  Important Information About Sugar

## 2022-09-09 LAB — CBC
Hematocrit: 39.7 % (ref 37.5–51.0)
Hemoglobin: 12.2 g/dL — ABNORMAL LOW (ref 13.0–17.7)
MCH: 20 pg — ABNORMAL LOW (ref 26.6–33.0)
MCHC: 30.7 g/dL — ABNORMAL LOW (ref 31.5–35.7)
MCV: 65 fL — ABNORMAL LOW (ref 79–97)
Platelets: 250 10*3/uL (ref 150–450)
RBC: 6.11 x10E6/uL — ABNORMAL HIGH (ref 4.14–5.80)
RDW: 17.1 % — ABNORMAL HIGH (ref 11.6–15.4)
WBC: 6.6 10*3/uL (ref 3.4–10.8)

## 2022-09-09 LAB — COMPREHENSIVE METABOLIC PANEL
ALT: 23 IU/L (ref 0–44)
AST: 20 IU/L (ref 0–40)
Albumin/Globulin Ratio: 2.2 (ref 1.2–2.2)
Albumin: 4.8 g/dL (ref 3.9–4.9)
Alkaline Phosphatase: 73 IU/L (ref 44–121)
BUN/Creatinine Ratio: 16 (ref 10–24)
BUN: 17 mg/dL (ref 8–27)
Bilirubin Total: 0.8 mg/dL (ref 0.0–1.2)
CO2: 22 mmol/L (ref 20–29)
Calcium: 9.6 mg/dL (ref 8.6–10.2)
Chloride: 102 mmol/L (ref 96–106)
Creatinine, Ser: 1.06 mg/dL (ref 0.76–1.27)
Globulin, Total: 2.2 g/dL (ref 1.5–4.5)
Glucose: 138 mg/dL — ABNORMAL HIGH (ref 70–99)
Potassium: 5.2 mmol/L (ref 3.5–5.2)
Sodium: 141 mmol/L (ref 134–144)
Total Protein: 7 g/dL (ref 6.0–8.5)
eGFR: 77 mL/min/{1.73_m2} (ref >59)

## 2022-09-09 LAB — TSH: TSH: 0.859 u[IU]/mL (ref 0.450–4.500)

## 2022-09-09 LAB — LIPID PANEL
Chol/HDL Ratio: 2.6 ratio (ref 0.0–5.0)
Cholesterol, Total: 102 mg/dL (ref 100–199)
HDL: 39 mg/dL — ABNORMAL LOW (ref >39)
LDL Chol Calc (NIH): 48 mg/dL (ref 0–99)
Triglycerides: 73 mg/dL (ref 0–149)
VLDL Cholesterol Cal: 15 mg/dL (ref 5–40)

## 2022-09-09 LAB — HEMOGLOBIN A1C
Est. average glucose Bld gHb Est-mCnc: 160 mg/dL
Hgb A1c MFr Bld: 7.2 % — ABNORMAL HIGH (ref 4.8–5.6)

## 2022-09-09 NOTE — Telephone Encounter (Signed)
Pt called and lvm to return

## 2022-09-12 ENCOUNTER — Telehealth: Payer: Self-pay | Admitting: Medical

## 2022-09-12 ENCOUNTER — Other Ambulatory Visit: Payer: Self-pay | Admitting: *Deleted

## 2022-09-12 MED ORDER — METFORMIN HCL 500 MG PO TABS: 500.0000 mg | ORAL_TABLET | Freq: Two times a day (BID) | ORAL | 0 refills | Status: DC

## 2022-09-12 NOTE — Telephone Encounter (Signed)
Copied from [place] [id number]. Topic: Medicare AWV >> Sep 12, 2022 10:28 AM Devoria Glassing wrote: Reason for CRM: Called patient to schedule Medicare Annual Wellness Visit (AWV). Left message for patient to call back and schedule Medicare Annual Wellness Visit (AWV).  Last date of AWV: NONE  Please schedule an appointment at any time with [facility].  If any questions, please contact me.  Thank you ,  [name]; [place] Direct Dial: [phone]

## 2022-09-12 NOTE — Telephone Encounter (Signed)
Contacted Alec [name] to schedule their annual wellness visit. Patient declined to schedule AWV at this time.He wants to do AWV with insurance company  [name]; [place] Direct Dial: [phone]

## 2022-09-12 NOTE — Telephone Encounter (Signed)
FYI Spoke with pt, the cardiologist done the A1c.  He will work on diet and do and office visit in 3 months.  Rx for metformin refilled.   Appointment made for 12/13/22  A1c was 7.2.

## 2022-10-03 ENCOUNTER — Telehealth: Payer: Self-pay | Admitting: Medical

## 2022-10-03 ENCOUNTER — Other Ambulatory Visit (HOSPITAL_COMMUNITY): Payer: Self-pay

## 2022-10-03 MED ORDER — JANUVIA 25 MG PO TABS
25.0000 mg | ORAL_TABLET | Freq: Every day | ORAL | 0 refills | Status: DC
  Filled 2022-10-03: qty 30, 30d supply, fill #0

## 2022-10-03 NOTE — Telephone Encounter (Signed)
Prescription Request  10/03/2022  Is this a "Controlled Substance" medicine? No  LOV: Visit date not found  What is the name of the medication or equipment?  JANUVIA 25 MG tablet   Have you contacted your pharmacy to request a refill? Yes   [facility]    Patient notified that their request is being sent to the clinical staff for review and that they should receive a response within 2 business days.   Please advise at [facility] [phone]

## 2022-10-03 NOTE — Telephone Encounter (Signed)
Rx sent 

## 2022-10-05 ENCOUNTER — Other Ambulatory Visit (HOSPITAL_COMMUNITY): Payer: Self-pay

## 2022-10-06 ENCOUNTER — Other Ambulatory Visit (HOSPITAL_COMMUNITY): Payer: Self-pay

## 2022-10-06 ENCOUNTER — Other Ambulatory Visit: Payer: Self-pay | Admitting: Cardiology

## 2022-10-06 MED ORDER — ROSUVASTATIN CALCIUM 20 MG PO TABS
20.0000 mg | ORAL_TABLET | Freq: Every day | ORAL | 2 refills | Status: DC
  Filled 2022-10-06 – 2022-11-07 (×2): qty 90, 90d supply, fill #0
  Filled 2022-12-13 – 2022-12-23 (×3): qty 90, 90d supply, fill #1
  Filled 2023-06-13: qty 90, 90d supply, fill #2

## 2022-10-27 ENCOUNTER — Other Ambulatory Visit (HOSPITAL_COMMUNITY): Payer: Self-pay

## 2022-11-07 ENCOUNTER — Other Ambulatory Visit: Payer: Self-pay | Admitting: Medical

## 2022-11-08 ENCOUNTER — Encounter: Payer: Self-pay | Admitting: Pharmacist

## 2022-11-08 ENCOUNTER — Other Ambulatory Visit (HOSPITAL_COMMUNITY): Payer: Self-pay

## 2022-11-08 ENCOUNTER — Other Ambulatory Visit: Payer: Self-pay

## 2022-11-08 MED ORDER — SITAGLIPTIN PHOSPHATE 25 MG PO TABS
25.0000 mg | ORAL_TABLET | Freq: Every day | ORAL | 0 refills | Status: DC
  Filled 2022-11-08: qty 30, 30d supply, fill #0

## 2022-11-24 DIAGNOSIS — H2513 Age-related nuclear cataract, bilateral: Secondary | ICD-10-CM | POA: Diagnosis not present

## 2022-11-24 DIAGNOSIS — H524 Presbyopia: Secondary | ICD-10-CM | POA: Diagnosis not present

## 2022-11-24 DIAGNOSIS — E119 Type 2 diabetes mellitus without complications: Secondary | ICD-10-CM | POA: Diagnosis not present

## 2022-11-24 LAB — HM DIABETES EYE EXAM

## 2022-12-01 ENCOUNTER — Other Ambulatory Visit: Payer: Self-pay | Admitting: Medical

## 2022-12-01 ENCOUNTER — Other Ambulatory Visit: Payer: Self-pay

## 2022-12-01 ENCOUNTER — Other Ambulatory Visit (HOSPITAL_COMMUNITY): Payer: Self-pay

## 2022-12-01 MED ORDER — SITAGLIPTIN PHOSPHATE 25 MG PO TABS
25.0000 mg | ORAL_TABLET | Freq: Every day | ORAL | 0 refills | Status: DC
  Filled 2022-12-01: qty 30, 30d supply, fill #0

## 2022-12-13 ENCOUNTER — Other Ambulatory Visit: Payer: Self-pay | Admitting: Urology

## 2022-12-13 ENCOUNTER — Ambulatory Visit (INDEPENDENT_AMBULATORY_CARE_PROVIDER_SITE_OTHER): Payer: Medicare Other | Admitting: Medical

## 2022-12-13 VITALS — BP 126/70 | HR 66 | Resp 18 | Ht 69.6 in | Wt 180.6 lb

## 2022-12-13 DIAGNOSIS — R3915 Urgency of urination: Secondary | ICD-10-CM

## 2022-12-13 DIAGNOSIS — E118 Type 2 diabetes mellitus with unspecified complications: Secondary | ICD-10-CM | POA: Diagnosis not present

## 2022-12-13 DIAGNOSIS — I2510 Atherosclerotic heart disease of native coronary artery without angina pectoris: Secondary | ICD-10-CM

## 2022-12-13 DIAGNOSIS — D649 Anemia, unspecified: Secondary | ICD-10-CM | POA: Diagnosis not present

## 2022-12-13 DIAGNOSIS — Z7984 Long term (current) use of oral hypoglycemic drugs: Secondary | ICD-10-CM

## 2022-12-13 DIAGNOSIS — F17200 Nicotine dependence, unspecified, uncomplicated: Secondary | ICD-10-CM | POA: Diagnosis not present

## 2022-12-13 DIAGNOSIS — Z8546 Personal history of malignant neoplasm of prostate: Secondary | ICD-10-CM

## 2022-12-13 DIAGNOSIS — E785 Hyperlipidemia, unspecified: Secondary | ICD-10-CM | POA: Diagnosis not present

## 2022-12-13 DIAGNOSIS — Z1211 Encounter for screening for malignant neoplasm of colon: Secondary | ICD-10-CM

## 2022-12-13 DIAGNOSIS — E1169 Type 2 diabetes mellitus with other specified complication: Secondary | ICD-10-CM

## 2022-12-13 DIAGNOSIS — N401 Enlarged prostate with lower urinary tract symptoms: Secondary | ICD-10-CM

## 2022-12-13 DIAGNOSIS — R319 Hematuria, unspecified: Secondary | ICD-10-CM

## 2022-12-13 LAB — LIPID PANEL
Cholesterol: 105 mg/dL (ref 0–200)
HDL: 36.2 mg/dL — ABNORMAL LOW (ref >39.00)
LDL Cholesterol: 49 mg/dL (ref 0–99)
NonHDL: 68.82
Total CHOL/HDL Ratio: 3
Triglycerides: 99 mg/dL (ref 0.0–149.0)
VLDL: 19.8 mg/dL (ref 0.0–40.0)

## 2022-12-13 LAB — COMPREHENSIVE METABOLIC PANEL
ALT: 19 U/L (ref 0–53)
AST: 19 U/L (ref 0–37)
Albumin: 4.8 g/dL (ref 3.5–5.2)
Alkaline Phosphatase: 58 U/L (ref 39–117)
BUN: 19 mg/dL (ref 6–23)
CO2: 31 mEq/L (ref 19–32)
Calcium: 9.1 mg/dL (ref 8.4–10.5)
Chloride: 102 mEq/L (ref 96–112)
Creatinine, Ser: 0.88 mg/dL (ref 0.40–1.50)
GFR: 89.57 mL/min (ref >60.00)
Glucose, Bld: 133 mg/dL — ABNORMAL HIGH (ref 70–99)
Potassium: 4.7 mEq/L (ref 3.5–5.1)
Sodium: 140 mEq/L (ref 135–145)
Total Bilirubin: 0.9 mg/dL (ref 0.2–1.2)
Total Protein: 7.1 g/dL (ref 6.0–8.3)

## 2022-12-13 LAB — POC URINALSYSI DIPSTICK (AUTOMATED)
Bilirubin, UA: NEGATIVE
Glucose, UA: NEGATIVE
Ketones, UA: NEGATIVE
Leukocytes, UA: NEGATIVE
Nitrite, UA: NEGATIVE
Protein, UA: NEGATIVE
Spec Grav, UA: <=1.005 — AB (ref 1.010–1.025)
Urobilinogen, UA: 0.2 E.U./dL
pH, UA: 6 (ref 5.0–8.0)

## 2022-12-13 LAB — CBC WITH DIFFERENTIAL/PLATELET
Basophils Absolute: 0.1 10*3/uL (ref 0.0–0.1)
Basophils Relative: 1 % (ref 0.0–3.0)
Eosinophils Absolute: 0 10*3/uL (ref 0.0–0.7)
Eosinophils Relative: 0.7 % (ref 0.0–5.0)
HCT: 39.3 % (ref 39.0–52.0)
Hemoglobin: 12.2 g/dL — ABNORMAL LOW (ref 13.0–17.0)
Lymphocytes Relative: 26.8 % (ref 12.0–46.0)
Lymphs Abs: 1.8 10*3/uL (ref 0.7–4.0)
MCHC: 31.1 g/dL (ref 30.0–36.0)
MCV: 64.3 fl — ABNORMAL LOW (ref 78.0–100.0)
Monocytes Absolute: 0.5 10*3/uL (ref 0.1–1.0)
Monocytes Relative: 7.2 % (ref 3.0–12.0)
Neutro Abs: 4.3 10*3/uL (ref 1.4–7.7)
Neutrophils Relative %: 64.3 % (ref 43.0–77.0)
Platelets: 191 10*3/uL (ref 150.0–400.0)
RBC: 6.1 Mil/uL — ABNORMAL HIGH (ref 4.22–5.81)
RDW: 16.3 % — ABNORMAL HIGH (ref 11.5–15.5)
WBC: 6.6 10*3/uL (ref 4.0–10.5)

## 2022-12-13 LAB — HEMOGLOBIN A1C: Hgb A1c MFr Bld: 6.5 % (ref 4.6–6.5)

## 2022-12-13 LAB — PSA: PSA: 0.06 ng/mL — ABNORMAL LOW (ref 0.10–4.00)

## 2022-12-13 NOTE — Patient Instructions (Addendum)
1. Urinary urgency with some dysuria.  Follow labs. If negative consider possible antibiotic for urethritis. Since hx of prostate CA and tx would not think prostate infection related. On review some bph and on flomax. Dose might be able to be increased. - POCT Urinalysis Dipstick (Automated) - Urine Culture  2. Anemia, unspecified type Follow level.  - CBC w/Diff  3. Type 2 diabetes mellitus with complication, without long-term current use of insulin (HCC) Continue metfomin and januvia.  May modify tx if A1c still high/increased. - Comp Met (CMET) - Hemoglobin A1c  4. Smoker Failed wellbutrin and chantix in past but actually cut back significantly over the year. - Ambulatory referral to Pulmonology(number is [phone]  5. Coronary artery disease involving native heart without angina pectoris, unspecified vessel or lesion type Contine crestor - Comp Met (CMET)  6. Hyperlipidemia associated with type 2 diabetes mellitus (HCC) Continue crestor. - Lipid panel   Follow up date to be determined after lab review.

## 2022-12-13 NOTE — Addendum Note (Signed)
Addended by: Gwenevere Abbot on: 12/13/2022 07:24 PM   Modules accepted: Orders

## 2022-12-13 NOTE — Addendum Note (Signed)
Addended by: Gwenevere Abbot on: 12/13/2022 11:10 AM   Modules accepted: Orders

## 2022-12-13 NOTE — Addendum Note (Signed)
Addended by: Gwenevere Abbot on: 12/13/2022 07:12 PM   Modules accepted: Orders

## 2022-12-13 NOTE — Progress Notes (Signed)
Subjective:    Patient ID: Alec [name], male    DOB: November 06, 1955, 67 y.o.   MRN: [id number]  HPI  Pt in for follow up.   Prostate CA history with the radiation treatment. Followed by urologist.  Pt notes some urge to urinate at times will burning off and on before he urinates. Pt followed by urologist. Did not mention to urologist.   Diabetic- last A1c was 7.2. Back in February cardiologist checked. Pt is on metformin  500 mg bid and januvia 25 mg daily.   Smoker- no just 1/2 pack a day. Former 1.5 pack a day smoker.   67 year-old male current smoker with 72 pack-year history of smoking. Lung cancer screening examination done and annual screening with low-dose chest CT without contrast in 12 months. Was recommended. Copd finding on study.    Review of Systems  Constitutional:  Negative for chills, fatigue and fever.  Respiratory:  Negative for chest tightness, shortness of breath and wheezing.   Cardiovascular:  Negative for chest pain and palpitations.  Gastrointestinal:  Negative for abdominal pain, blood in stool and nausea.  Endocrine: Negative for polydipsia, polyphagia and polyuria.  Genitourinary:  Positive for dysuria.       Urgency to urinate with buning before urination and sometimes after.  Musculoskeletal:  Negative for back pain, joint swelling, myalgias and neck stiffness.  Skin:  Negative for pallor and rash.  Neurological:  Negative for dizziness, seizures, light-headedness, numbness and headaches.  Hematological:  Negative for adenopathy. Does not bruise/bleed easily.  Psychiatric/Behavioral:  Negative for behavioral problems, confusion, dysphoric mood and hallucinations. The patient is not nervous/anxious.     Past Medical History:  Diagnosis Date   ALLERGIC RHINITIS    Allergy    Anemia 05/06/2014   Arthritis    Back pain 05/01/2014   Cigarette smoker 03/10/2022   COLONIC POLYPS, HX OF 05/20/2009   Qualifier: Diagnosis of  By: Felicity Coyer MD,  Raenette Rover    Diabetes (HCC) 07/09/2014   Diabetes mellitus without complication (HCC)    type 2   Diverticulitis of colon 05/20/2009   Qualifier: Diagnosis of  By: Felicity Coyer MD, Vikki Ports A    GERD    LEG PAIN, LEFT    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/10/2014   Malignant neoplasm of prostate (HCC) 07/08/2018   Mixed dyslipidemia 03/10/2022   Needs smoking cessation education 05/01/2014   Numbness of finger 10/10/2014   Pain in joint, forearm 05/01/2014   Pain in joint, upper arm 05/01/2014   Pain in the abdomen 05/01/2014   Prostate cancer (HCC)    SINUSITIS, CHRONIC    SMOKER    Wellness examination 06/02/2014     Social History   Socioeconomic History   Marital status: Married    Spouse name: Not on file   Number of children: Not on file   Years of education: Not on file   Highest education level: 10th grade  Occupational History   Occupation: Merchandiser, retail   Tobacco Use   Smoking status: Every Day    Packs/day: 0.75    Years: 40.00    Additional pack years: 0.00    Total pack years: 30.00    Types: Cigarettes   Smokeless tobacco: Never   Tobacco comments:    trying to quit, from 1 1/2 ppd to 13-14/day  Vaping Use   Vaping Use: Never used  Substance and Sexual Activity   Alcohol use: No   Drug use: No   Sexual activity: Yes  Other Topics Concern   Not on file  Social History Narrative   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   Married, lives with wife (MM dx 2009)   Social Determinants of Health   Financial Resource Strain: Low Risk  (12/06/2022)   Overall Financial Resource Strain (CARDIA)    Difficulty of Paying Living Expenses: Not hard at all  Food Insecurity: No Food Insecurity (12/06/2022)   Hunger Vital Sign    Worried About Running Out of Food in the Last Year: Never true    Ran Out of Food in the Last Year: Never true  Transportation Needs: No Transportation Needs (12/06/2022)   PRAPARE - Administrator, Civil Service (Medical): No    Lack of  Transportation (Non-Medical): No  Physical Activity: Sufficiently Active (12/06/2022)   Exercise Vital Sign    Days of Exercise per Week: 5 days    Minutes of Exercise per Session: 30 min  Stress: No Stress Concern Present (12/06/2022)   [employer] of Occupational Health - Occupational Stress Questionnaire    Feeling of Stress : Not at all  Social Connections: Moderately Integrated (12/06/2022)   Social Connection and Isolation Panel [NHANES]    Frequency of Communication with Friends and Family: Once a week    Frequency of Social Gatherings with Friends and Family: Once a week    Attends Religious Services: More than 4 times per year    Active Member of Clubs or Organizations: Yes    Attends Engineer, structural: More than 4 times per year    Marital Status: Married  Catering manager Violence: Not on file    Past Surgical History:  Procedure Laterality Date   COLONOSCOPY     FINGER SURGERY     PROSTATE BIOPSY     RADIOACTIVE SEED IMPLANT N/A 09/20/2018   Procedure: RADIOACTIVE SEED IMPLANT/BRACHYTHERAPY IMPLANT;  Surgeon: Bjorn Pippin, MD;  Location: [facility];  Service: Urology;  Laterality: N/A;   SPACE OAR INSTILLATION N/A 09/20/2018   Procedure: SPACE OAR INSTILLATION;  Surgeon: Bjorn Pippin, MD;  Location[facility];  Service: Urology;  Laterality: N/A;   SPINE SURGERY     lumbar surgery 2012.   STOMACH SURGERY      Family History  Problem Relation Age of Onset   Alzheimer's disease Mother    Heart disease Father    Parkinsonism Father    Alcohol abuse Brother    Colon cancer Neg Hx    Esophageal cancer Neg Hx    Liver cancer Neg Hx    Pancreatic cancer Neg Hx    Rectal cancer Neg Hx    Stomach cancer Neg Hx     No Known Allergies  Current Outpatient Medications on File Prior to Visit  Medication Sig Dispense Refill   Continuous Blood Gluc Receiver (DEXCOM G6 RECEIVER) DEVI Use as directed.  Dx code: E11.9 1 each 0    Continuous Blood Gluc Sensor (DEXCOM G6 SENSOR) MISC Use as directed to check glucose. Change every 10 days. 3 each 0   Continuous Blood Gluc Transmit (DEXCOM G6 TRANSMITTER) MISC Use as directed.  Dx code: E11.9 1 each 0   fluticasone (FLONASE) 50 MCG/ACT nasal spray Place 2 sprays into both nostrils daily. 16 g 0   glucose blood (ONETOUCH VERIO) test strip Use as instructed 100 each 12   HYDROcodone-acetaminophen (NORCO) 5-325 MG tablet Take 1 tablet by mouth every 6 (six) hours as needed for moderate pain. 8 tablet  0   hydrocortisone-pramoxine (PROCTOFOAM-HC) rectal foam Place 1 applicator rectally 2 (two) times daily. 10 g 0   metFORMIN (GLUCOPHAGE) 500 MG tablet Take 1 tablet (500 mg total) by mouth 2 (two) times daily with a meal. 180 tablet 0   mupirocin ointment (BACTROBAN) 2 % Apply 1 application topically 2 (two) times daily. 30 g 0   ONETOUCH DELICA LANCETS 33G MISC Use as directed. 100 each 12   promethazine-dextromethorphan (PROMETHAZINE-DM) 6.25-15 MG/5ML syrup Take 5 mLs by mouth 4 (four) times daily as needed for cough. 118 mL 0   rosuvastatin (CRESTOR) 20 MG tablet Take 1 tablet by mouth once daily 90 tablet 2   sitaGLIPtin (JANUVIA) 25 MG tablet Take 1 tablet (25 mg total) by mouth daily. 30 tablet 0   tamsulosin (FLOMAX) 0.4 MG CAPS capsule Take 1 capsule (0.4 mg total) by mouth 2 (two) times daily. 60 capsule 0   Current Facility-Administered Medications on File Prior to Visit  Medication Dose Route Frequency Provider Last Rate Last Admin   hydrocortisone-pramoxine (PROCTOFOAM-HC) rectal foam 1 applicator  1 applicator Rectal BID Rowen Wilmer, PA-C        BP 126/70   Pulse 66   Resp 18   Ht 5' 9.6" (1.768 m)   Wt 180 lb 9.6 oz (81.9 kg)   SpO2 99%   BMI 26.21 kg/m        Objective:   Physical Exam  General Mental Status- Alert. General Appearance- Not in acute distress.   Skin General: Color- Normal Color. Moisture- Normal Moisture.  Neck Carotid  Arteries- Normal color. Moisture- Normal Moisture. No carotid bruits. No JVD.  Chest and Lung Exam Auscultation: Breath Sounds:-Normal.  Cardiovascular Auscultation:Rythm- Regular. Murmurs & Other Heart Sounds:Auscultation of the heart reveals- No Murmurs.  Abdomen Inspection:-Inspeection Normal. Palpation/Percussion:Note:No mass. Palpation and Percussion of the abdomen reveal- Non Tender, Non Distended + BS, no rebound or guarding.   Neurologic Cranial Nerve exam:- CN III-XII intact(No nystagmus), symmetric smile. Strength:- 5/5 equal and symmetric strength both upper and lower extremities.       Assessment & Plan:   Patient Instructions  1. Urinary urgency with some dysuria.  Follow labs. If negative consider possible antibiotic for urethritis. Since hx of prostate CA and tx would not think prostate infection related. On review some bph and on flomax. Dose might be able to be increased. - POCT Urinalysis Dipstick (Automated) - Urine Culture  2. Anemia, unspecified type Follow level.  - CBC w/Diff  3. Type 2 diabetes mellitus with complication, without long-term current use of insulin (HCC) Continue metfomin and januvia.  May modify tx if A1c still high/increased. - Comp Met (CMET) - Hemoglobin A1c  4. Smoker Failed wellbutrin and chantix in past but actually cut back significantly over the year. - Ambulatory referral to Pulmonology(number is [phone]  5. Coronary artery disease involving native heart without angina pectoris, unspecified vessel or lesion type Contine crestor - Comp Met (CMET)  6. Hyperlipidemia associated with type 2 diabetes mellitus (HCC) Continue crestor. - Lipid panel   Follow up date to be determined after lab review.   Esperanza Richters, PA-C

## 2022-12-14 ENCOUNTER — Other Ambulatory Visit (HOSPITAL_COMMUNITY): Payer: Self-pay

## 2022-12-14 LAB — URINE CULTURE
MICRO NUMBER:: 15038634
Result:: NO GROWTH
SPECIMEN QUALITY:: ADEQUATE

## 2022-12-15 MED ORDER — TAMSULOSIN HCL 0.4 MG PO CAPS
0.4000 mg | ORAL_CAPSULE | Freq: Two times a day (BID) | ORAL | 11 refills | Status: DC
Start: 2022-12-15 — End: 2024-03-02
  Filled 2022-12-15: qty 60, 30d supply, fill #0
  Filled 2023-01-10: qty 60, 30d supply, fill #1
  Filled 2023-03-01: qty 60, 30d supply, fill #2
  Filled 2023-05-01: qty 60, 30d supply, fill #3
  Filled 2023-06-13: qty 60, 30d supply, fill #4
  Filled 2023-09-12: qty 60, 30d supply, fill #5

## 2022-12-16 ENCOUNTER — Other Ambulatory Visit: Payer: Self-pay

## 2022-12-22 ENCOUNTER — Encounter (HOSPITAL_BASED_OUTPATIENT_CLINIC_OR_DEPARTMENT_OTHER): Payer: Self-pay

## 2022-12-23 ENCOUNTER — Other Ambulatory Visit (HOSPITAL_COMMUNITY): Payer: Self-pay

## 2022-12-26 ENCOUNTER — Telehealth: Payer: Self-pay | Admitting: Medical

## 2022-12-26 NOTE — Telephone Encounter (Signed)
Patient has called to inform us that he remembers discussing in his last appointment that he needed to do a stool sample but never got the supplies to do so. Pt is coming tomorrow to complete the labs that were already ordered but wants to know if he still needs to do the stool. Please advise.

## 2022-12-27 ENCOUNTER — Other Ambulatory Visit (INDEPENDENT_AMBULATORY_CARE_PROVIDER_SITE_OTHER): Payer: Medicare Other

## 2022-12-27 DIAGNOSIS — R82998 Other abnormal findings in urine: Secondary | ICD-10-CM

## 2022-12-27 DIAGNOSIS — R319 Hematuria, unspecified: Secondary | ICD-10-CM

## 2022-12-27 DIAGNOSIS — D649 Anemia, unspecified: Secondary | ICD-10-CM

## 2022-12-27 LAB — POC URINALSYSI DIPSTICK (AUTOMATED)
Glucose, UA: NEGATIVE
Ketones, UA: NEGATIVE
Nitrite, UA: NEGATIVE
Protein, UA: POSITIVE — AB
Spec Grav, UA: 1.025 (ref 1.010–1.025)
Urobilinogen, UA: 0.2 E.U./dL
pH, UA: 5 (ref 5.0–8.0)

## 2022-12-27 NOTE — Addendum Note (Signed)
Addended by: Mervin Kung A on: 12/27/2022 04:42 PM   Modules accepted: Orders

## 2022-12-28 LAB — IRON,TIBC AND FERRITIN PANEL
%SAT: 28 % (calc) (ref 20–48)
Ferritin: 160 ng/mL (ref 24–380)
Iron: 103 ug/dL (ref 50–180)
TIBC: 362 mcg/dL (calc) (ref 250–425)

## 2022-12-28 LAB — URINE CULTURE
MICRO NUMBER:: 15096716
Result:: NO GROWTH
SPECIMEN QUALITY:: ADEQUATE

## 2022-12-31 ENCOUNTER — Other Ambulatory Visit: Payer: Self-pay | Admitting: Medical

## 2023-01-02 ENCOUNTER — Other Ambulatory Visit (HOSPITAL_COMMUNITY): Payer: Self-pay

## 2023-01-02 ENCOUNTER — Encounter: Payer: Self-pay | Admitting: Medical

## 2023-01-02 ENCOUNTER — Other Ambulatory Visit: Payer: Self-pay

## 2023-01-02 MED ORDER — SITAGLIPTIN PHOSPHATE 25 MG PO TABS
25.0000 mg | ORAL_TABLET | Freq: Every day | ORAL | 0 refills | Status: DC
  Filled 2023-01-02: qty 30, 30d supply, fill #0

## 2023-01-02 NOTE — Addendum Note (Signed)
Addended by: Gwenevere Abbot on: 01/02/2023 05:22 PM   Modules accepted: Orders

## 2023-01-11 ENCOUNTER — Other Ambulatory Visit: Payer: Self-pay

## 2023-01-11 ENCOUNTER — Other Ambulatory Visit (HOSPITAL_COMMUNITY): Payer: Self-pay

## 2023-02-01 ENCOUNTER — Other Ambulatory Visit (HOSPITAL_BASED_OUTPATIENT_CLINIC_OR_DEPARTMENT_OTHER): Payer: Self-pay

## 2023-02-05 ENCOUNTER — Other Ambulatory Visit: Payer: Self-pay | Admitting: Medical

## 2023-02-06 ENCOUNTER — Other Ambulatory Visit (HOSPITAL_COMMUNITY): Payer: Self-pay

## 2023-02-06 MED ORDER — SITAGLIPTIN PHOSPHATE 25 MG PO TABS
25.0000 mg | ORAL_TABLET | Freq: Every day | ORAL | 0 refills | Status: DC
  Filled 2023-02-06: qty 30, 30d supply, fill #0

## 2023-02-17 ENCOUNTER — Institutional Professional Consult (permissible substitution): Payer: Medicare Other | Admitting: Pulmonary Disease

## 2023-03-01 ENCOUNTER — Other Ambulatory Visit: Payer: Self-pay

## 2023-04-01 ENCOUNTER — Other Ambulatory Visit (HOSPITAL_COMMUNITY): Payer: Self-pay

## 2023-04-01 ENCOUNTER — Other Ambulatory Visit: Payer: Self-pay | Admitting: Medical

## 2023-04-03 ENCOUNTER — Other Ambulatory Visit (HOSPITAL_COMMUNITY): Payer: Self-pay

## 2023-04-03 ENCOUNTER — Other Ambulatory Visit: Payer: Self-pay

## 2023-04-03 MED ORDER — SITAGLIPTIN PHOSPHATE 25 MG PO TABS
25.0000 mg | ORAL_TABLET | Freq: Every day | ORAL | 0 refills | Status: DC
  Filled 2023-04-03: qty 30, 30d supply, fill #0

## 2023-04-03 MED ORDER — METFORMIN HCL 500 MG PO TABS
500.0000 mg | ORAL_TABLET | Freq: Two times a day (BID) | ORAL | 0 refills | Status: DC
  Filled 2023-04-03: qty 180, 90d supply, fill #0

## 2023-04-17 DIAGNOSIS — H524 Presbyopia: Secondary | ICD-10-CM | POA: Diagnosis not present

## 2023-04-21 ENCOUNTER — Institutional Professional Consult (permissible substitution): Payer: Medicare Other | Admitting: Pulmonary Disease

## 2023-05-01 ENCOUNTER — Other Ambulatory Visit: Payer: Self-pay

## 2023-05-01 ENCOUNTER — Other Ambulatory Visit: Payer: Self-pay | Admitting: Medical

## 2023-05-01 ENCOUNTER — Other Ambulatory Visit (HOSPITAL_COMMUNITY): Payer: Self-pay

## 2023-05-01 MED ORDER — SITAGLIPTIN PHOSPHATE 25 MG PO TABS
25.0000 mg | ORAL_TABLET | Freq: Every day | ORAL | 0 refills | Status: DC
  Filled 2023-05-01: qty 30, 30d supply, fill #0

## 2023-06-13 ENCOUNTER — Other Ambulatory Visit: Payer: Self-pay

## 2023-06-13 ENCOUNTER — Other Ambulatory Visit (HOSPITAL_COMMUNITY): Payer: Self-pay

## 2023-06-13 ENCOUNTER — Other Ambulatory Visit: Payer: Self-pay | Admitting: Medical

## 2023-06-13 MED ORDER — SITAGLIPTIN PHOSPHATE 25 MG PO TABS
25.0000 mg | ORAL_TABLET | Freq: Every day | ORAL | 0 refills | Status: DC
  Filled 2023-06-13: qty 30, 30d supply, fill #0
  Filled 2023-09-12 – 2023-09-13 (×2): qty 30, 30d supply, fill #1

## 2023-06-13 MED ORDER — METFORMIN HCL 500 MG PO TABS
500.0000 mg | ORAL_TABLET | Freq: Two times a day (BID) | ORAL | 0 refills | Status: DC
  Filled 2023-06-13: qty 180, 90d supply, fill #0

## 2023-06-29 NOTE — Telephone Encounter (Signed)
Results abstracted and Care Team is up to date.

## 2023-07-18 ENCOUNTER — Institutional Professional Consult (permissible substitution): Payer: Medicare Other | Admitting: Pulmonary Disease

## 2023-09-12 ENCOUNTER — Other Ambulatory Visit (HOSPITAL_COMMUNITY): Payer: Self-pay

## 2023-09-12 ENCOUNTER — Other Ambulatory Visit: Payer: Self-pay | Admitting: Medical

## 2023-09-12 MED ORDER — METFORMIN HCL 500 MG PO TABS
500.0000 mg | ORAL_TABLET | Freq: Two times a day (BID) | ORAL | 0 refills | Status: DC
  Filled 2023-09-12: qty 180, 90d supply, fill #0

## 2023-09-13 ENCOUNTER — Institutional Professional Consult (permissible substitution): Payer: Medicare Other | Admitting: Pulmonary Disease

## 2023-09-13 ENCOUNTER — Other Ambulatory Visit (HOSPITAL_COMMUNITY): Payer: Self-pay

## 2023-09-13 ENCOUNTER — Other Ambulatory Visit: Payer: Self-pay

## 2023-10-28 ENCOUNTER — Other Ambulatory Visit (HOSPITAL_COMMUNITY): Payer: Self-pay

## 2023-11-02 ENCOUNTER — Other Ambulatory Visit: Payer: Self-pay

## 2023-11-07 ENCOUNTER — Encounter: Payer: Self-pay | Admitting: Cardiology

## 2023-11-07 ENCOUNTER — Other Ambulatory Visit (HOSPITAL_COMMUNITY): Payer: Self-pay

## 2023-11-07 ENCOUNTER — Ambulatory Visit: Attending: Cardiology | Admitting: Cardiology

## 2023-11-07 ENCOUNTER — Other Ambulatory Visit: Payer: Self-pay

## 2023-11-07 ENCOUNTER — Other Ambulatory Visit: Payer: Self-pay | Admitting: Medical

## 2023-11-07 VITALS — BP 122/64 | HR 67 | Ht 70.0 in | Wt 185.0 lb

## 2023-11-07 DIAGNOSIS — E119 Type 2 diabetes mellitus without complications: Secondary | ICD-10-CM | POA: Diagnosis not present

## 2023-11-07 DIAGNOSIS — F17210 Nicotine dependence, cigarettes, uncomplicated: Secondary | ICD-10-CM

## 2023-11-07 DIAGNOSIS — I2510 Atherosclerotic heart disease of native coronary artery without angina pectoris: Secondary | ICD-10-CM | POA: Diagnosis not present

## 2023-11-07 DIAGNOSIS — E782 Mixed hyperlipidemia: Secondary | ICD-10-CM

## 2023-11-07 MED ORDER — SITAGLIPTIN PHOSPHATE 25 MG PO TABS
25.0000 mg | ORAL_TABLET | Freq: Every day | ORAL | 0 refills | Status: DC
  Filled 2023-11-07: qty 30, 30d supply, fill #0

## 2023-11-07 NOTE — Progress Notes (Signed)
 Cardiology Office Note:    Date:  11/07/2023   ID:  Alec [name], DOB 12-21-1955, MRN [id number]  PCP:  Sylvia Everts, PA-C  Cardiologist:  Nelia Balzarine, MD   Referring MD: Sylvia Everts, PA-C    ASSESSMENT:    1. Coronary artery disease involving native coronary artery of native heart without angina pectoris   2. Diabetes mellitus without complication (HCC)   3. Cigarette smoker   4. Mixed dyslipidemia    PLAN:    In order of problems listed above:  Coronary artery disease: Secondary prevention stressed with the patient.  Importance of compliance with diet medication stressed any vocalized understanding.  He was advised to walk half an hour a day to the best of his ability and he promises to do so. Cigarette smoker: I spent 5 minutes with the patient discussing solely about smoking. Smoking cessation was counseled. I suggested to the patient also different medications and pharmacological interventions. Patient is keen to try stopping on its own at this time. He will get back to me if he needs any further assistance in this matter. Mixed dyslipidemia: On lipid-lowering medication and fasting and will have complete blood work today.  Goal LDL less than 60. Diabetes mellitus: Will do blood work including hemoglobin A1c.  Diet emphasized. He gives history of vitamin D  deficiency and we will do this for him.  Will check vitamin D  level. Patient will be seen in follow-up appointment in 6 months or earlier if the patient has any concerns.    Medication Adjustments/Labs and Tests Ordered: Current medicines are reviewed at length with the patient today.  Concerns regarding medicines are outlined above.  Orders Placed This Encounter  Procedures   EKG 12-Lead   No orders of the defined types were placed in this encounter.    No chief complaint on file.    History of Present Illness:    Alec [name] is a 68 y.o. male.  Patient has past medical history of  coronary artery disease, essential hypertension, mixed dyslipidemia, diabetes mellitus and cigarette smoking.  Unfortunately continues to smoke.  He denies any chest pain orthopnea or PND.  At the time of my evaluation, the patient is alert awake oriented and in no distress.  Past Medical History:  Diagnosis Date   Allergic rhinitis 05/20/2009   Qualifier: Diagnosis of   By: Myrtice Athens MD, Rufina Cough     IMO SNOMED Dx Update Oct 2024     Allergy    Anemia 05/06/2014   Arthritis    Back pain 05/01/2014   CAD (coronary artery disease) 09/08/2022   Cigarette smoker 03/10/2022   COLONIC POLYPS, HX OF 05/20/2009   Qualifier: Diagnosis of  By: Myrtice Athens MD, Rufina Cough    Diabetes (HCC) 07/09/2014   Diabetes mellitus without complication (HCC)    type 2   Diverticulitis of colon 05/20/2009   Qualifier: Diagnosis of  By: Myrtice Athens MD, Rufina Cough    GERD    History of colonic polyps 05/20/2009   Qualifier: Diagnosis of   By: Myrtice Athens MD, Rufina Cough     IMO SNOMED Dx Update Oct 2024     LEG PAIN, LEFT    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/10/2014   Malignant neoplasm of prostate (HCC) 07/08/2018   Mixed dyslipidemia 03/10/2022   Needs smoking cessation education 05/01/2014   Numbness of finger 10/10/2014   Pain in joint, forearm 05/01/2014   Pain in joint, upper arm 05/01/2014   Pain in the abdomen  05/01/2014   Prostate cancer (HCC)    Sinusitis, chronic 05/20/2009   Qualifier: Diagnosis of   By: Myrtice Athens MD, Rufina Cough     IMO SNOMED Dx Update Oct 2024     SMOKER    Wellness examination 06/02/2014    Past Surgical History:  Procedure Laterality Date   COLONOSCOPY     FINGER SURGERY     PROSTATE BIOPSY     RADIOACTIVE SEED IMPLANT N/A 09/20/2018   Procedure: RADIOACTIVE SEED IMPLANT/BRACHYTHERAPY IMPLANT;  Surgeon: Homero Luster, MD;  Location: [facility];  Service: Urology;  Laterality: N/A;   SPACE OAR INSTILLATION N/A 09/20/2018   Procedure: SPACE OAR INSTILLATION;  Surgeon: Homero Luster,  MD;  Location: [facility];  Service: Urology;  Laterality: N/A;   SPINE SURGERY     lumbar surgery 2012.   STOMACH SURGERY      Current Medications: Current Meds  Medication Sig   Continuous Blood Gluc Receiver (DEXCOM G6 RECEIVER) DEVI Use as directed.  Dx code: E11.9   Continuous Blood Gluc Sensor (DEXCOM G6 SENSOR) MISC Use as directed to check glucose. Change every 10 days.   Continuous Blood Gluc Transmit (DEXCOM G6 TRANSMITTER) MISC Use as directed.  Dx code: E11.9   glucose blood (ONETOUCH VERIO) test strip Use as instructed   HYDROcodone -acetaminophen  (NORCO) 5-325 MG tablet Take 1 tablet by mouth every 6 (six) hours as needed for moderate pain.   hydrocortisone -pramoxine (PROCTOFOAM -HC) rectal foam Place 1 applicator rectally 2 (two) times daily.   metFORMIN  (GLUCOPHAGE ) 500 MG tablet Take 1 tablet (500 mg total) by mouth 2 (two) times daily with a meal.   mupirocin  ointment (BACTROBAN ) 2 % Apply 1 application topically 2 (two) times daily.   ONETOUCH DELICA LANCETS 33G MISC Use as directed.   rosuvastatin  (CRESTOR ) 20 MG tablet Take 1 tablet by mouth once daily   sitaGLIPtin  (JANUVIA ) 25 MG tablet Take 1 tablet (25 mg total) by mouth daily.   tamsulosin  (FLOMAX ) 0.4 MG CAPS capsule Take 1 capsule (0.4 mg total) by mouth 2 (two) times daily.   Current Facility-Administered Medications for the 11/07/23 encounter (Office Visit) with Abbygail Willhoite R, MD  Medication   hydrocortisone -pramoxine (PROCTOFOAM -HC) rectal foam 1 applicator     Allergies:   Patient has no known allergies.   Social History   Socioeconomic History   Marital status: Married    Spouse name: Not on file   Number of children: Not on file   Years of education: Not on file   Highest education level: 10th grade  Occupational History   Occupation: Merchandiser, retail   Tobacco Use   Smoking status: Every Day    Current packs/day: 0.75    Average packs/day: 0.8 packs/day for 40.0 years (30.0 ttl  pk-yrs)    Types: Cigarettes   Smokeless tobacco: Never   Tobacco comments:    trying to quit, from 1 1/2 ppd to 13-14/day  Vaping Use   Vaping status: Never Used  Substance and Sexual Activity   Alcohol use: No   Drug use: No   Sexual activity: Yes  Other Topics Concern   Not on file  Social History Narrative   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   Married, lives with wife (MM dx 2009)   Social Drivers of Corporate investment banker Strain: Low Risk  (12/06/2022)   Overall Financial Resource Strain (CARDIA)    Difficulty of Paying Living Expenses: Not hard at all  Food Insecurity: No Food  Insecurity (12/06/2022)   Hunger Vital Sign    Worried About Running Out of Food in the Last Year: Never true    Ran Out of Food in the Last Year: Never true  Transportation Needs: No Transportation Needs (12/06/2022)   PRAPARE - Administrator, Civil Service (Medical): No    Lack of Transportation (Non-Medical): No  Physical Activity: Sufficiently Active (12/06/2022)   Exercise Vital Sign    Days of Exercise per Week: 5 days    Minutes of Exercise per Session: 30 min  Stress: No Stress Concern Present (12/06/2022)   [employer] of Occupational Health - Occupational Stress Questionnaire    Feeling of Stress : Not at all  Social Connections: Moderately Integrated (12/06/2022)   Social Connection and Isolation Panel [NHANES]    Frequency of Communication with Friends and Family: Once a week    Frequency of Social Gatherings with Friends and Family: Once a week    Attends Religious Services: More than 4 times per year    Active Member of [employer] or Organizations: Yes    Attends Engineer, structural: More than 4 times per year    Marital Status: Married     Family History: [name] family history includes Alcohol abuse in his brother; Alzheimer's disease in his mother; Heart disease in his father; Parkinsonism in his father. There is no history of Colon  cancer, Esophageal cancer, Liver cancer, Pancreatic cancer, Rectal cancer, or Stomach cancer.  ROS:   Please see the history of present illness.    All other systems reviewed and are negative.  EKGs/Labs/Other Studies Reviewed:    The following studies were reviewed today: I discussed my findings with the patient at length   Recent Labs: 12/13/2022: ALT 19; BUN 19; Creatinine, Ser 0.88; Hemoglobin 12.2; Platelets 191.0; Potassium 4.7; Sodium 140  Recent Lipid Panel    Component Value Date/Time   CHOL 105 12/13/2022 0906   CHOL 102 09/08/2022 0959   TRIG 99.0 12/13/2022 0906   HDL 36.20 (L) 12/13/2022 0906   HDL 39 (L) 09/08/2022 0959   CHOLHDL 3 12/13/2022 0906   VLDL 19.8 12/13/2022 0906   LDLCALC 49 12/13/2022 0906   LDLCALC 48 09/08/2022 0959   LDLCALC 49 01/31/2020 1516    Physical Exam:    VS:  BP 122/64   Pulse 67   Ht 5\' 10"  (1.778 m)   Wt 185 lb (83.9 kg)   SpO2 98%   BMI 26.54 kg/m     Wt Readings from Last 3 Encounters:  11/07/23 185 lb (83.9 kg)  12/13/22 180 lb 9.6 oz (81.9 kg)  09/08/22 191 lb 1.3 oz (86.7 kg)     GEN: Patient is in no acute distress HEENT: Normal NECK: No JVD; No carotid bruits LYMPHATICS: No lymphadenopathy CARDIAC: Hear sounds regular, 2/6 systolic murmur at the apex. RESPIRATORY:  Clear to auscultation without rales, wheezing or rhonchi  ABDOMEN: Soft, non-tender, non-distended MUSCULOSKELETAL:  No edema; No deformity  SKIN: Warm and dry NEUROLOGIC:  Alert and oriented x 3 PSYCHIATRIC:  Normal affect   Signed, Nelia Balzarine, MD  11/07/2023 9:09 AM    [facility]

## 2023-11-07 NOTE — Patient Instructions (Signed)
 Medication Instructions:  Your physician recommends that you continue on your current medications as directed. Please refer to the Current Medication list given to you today.  *If you need a refill on your cardiac medications before your next appointment, please call your pharmacy*  Lab Work: Your physician recommends that you return for lab work in:   Labs today: BMP, CBC, TSH, LFT, Lipid, Hbg A1c, Vitamin D   If you have labs (blood work) drawn today and your tests are completely normal, you will receive your results only by: MyChart Message (if you have MyChart) OR A paper copy in the mail If you have any lab test that is abnormal or we need to change your treatment, we will call you to review the results.  Testing/Procedures: None  Follow-Up: At [facility], you and your health needs are our priority.  As part of our continuing mission to provide you with exceptional heart care, our providers are all part of one team.  This team includes your primary Cardiologist (physician) and Advanced Practice Providers or APPs (Physician Assistants and Nurse Practitioners) who all work together to provide you with the care you need, when you need it.  Your next appointment:   9 month(s)  Provider:   Hillis Lu, MD    We recommend signing up for the patient portal called "MyChart".  Sign up information is provided on this After Visit Summary.  MyChart is used to connect with patients for Virtual Visits (Telemedicine).  Patients are able to view lab/test results, encounter notes, upcoming appointments, etc.  Non-urgent messages can be sent to your provider as well.   To learn more about what you can do with MyChart, go to [email or web address].   Other Instructions None

## 2023-11-07 NOTE — Addendum Note (Signed)
 Addended by: Aurelio Leer I on: 11/07/2023 09:34 AM   Modules accepted: Orders

## 2023-11-09 ENCOUNTER — Other Ambulatory Visit: Payer: Self-pay

## 2023-11-10 DIAGNOSIS — I2510 Atherosclerotic heart disease of native coronary artery without angina pectoris: Secondary | ICD-10-CM | POA: Diagnosis not present

## 2023-11-10 DIAGNOSIS — F17210 Nicotine dependence, cigarettes, uncomplicated: Secondary | ICD-10-CM | POA: Diagnosis not present

## 2023-11-10 DIAGNOSIS — E782 Mixed hyperlipidemia: Secondary | ICD-10-CM | POA: Diagnosis not present

## 2023-11-10 DIAGNOSIS — E119 Type 2 diabetes mellitus without complications: Secondary | ICD-10-CM | POA: Diagnosis not present

## 2023-11-15 ENCOUNTER — Ambulatory Visit: Admitting: Pulmonary Disease

## 2023-11-25 LAB — BASIC METABOLIC PANEL WITH GFR
BUN/Creatinine Ratio: 22 (ref 10–24)
BUN: 22 mg/dL (ref 8–27)
CO2: 26 mmol/L (ref 20–29)
Calcium: 9.3 mg/dL (ref 8.6–10.2)
Chloride: 100 mmol/L (ref 96–106)
Creatinine, Ser: 1 mg/dL (ref 0.76–1.27)
Glucose: 152 mg/dL — ABNORMAL HIGH (ref 70–99)
Potassium: 4.7 mmol/L (ref 3.5–5.2)
Sodium: 139 mmol/L (ref 134–144)
eGFR: 82 mL/min/{1.73_m2} (ref >59)

## 2023-11-25 LAB — LIPID PANEL
Chol/HDL Ratio: 2.7 ratio (ref 0.0–5.0)
Cholesterol, Total: 94 mg/dL — ABNORMAL LOW (ref 100–199)
HDL: 35 mg/dL — ABNORMAL LOW (ref >39)
LDL Chol Calc (NIH): 40 mg/dL (ref 0–99)
Triglycerides: 98 mg/dL (ref 0–149)
VLDL Cholesterol Cal: 19 mg/dL (ref 5–40)

## 2023-11-25 LAB — CBC
Hematocrit: 39.1 % (ref 37.5–51.0)
Hemoglobin: 11.6 g/dL — ABNORMAL LOW (ref 13.0–17.7)
MCH: 19.8 pg — ABNORMAL LOW (ref 26.6–33.0)
MCHC: 29.7 g/dL — ABNORMAL LOW (ref 31.5–35.7)
MCV: 67 fL — ABNORMAL LOW (ref 79–97)
Platelets: 236 10*3/uL (ref 150–450)
RBC: 5.85 x10E6/uL — ABNORMAL HIGH (ref 4.14–5.80)
RDW: 17.1 % — ABNORMAL HIGH (ref 11.6–15.4)
WBC: 6.1 10*3/uL (ref 3.4–10.8)

## 2023-11-25 LAB — HEPATIC FUNCTION PANEL
ALT: 20 IU/L (ref 0–44)
AST: 20 IU/L (ref 0–40)
Albumin: 4.7 g/dL (ref 3.9–4.9)
Alkaline Phosphatase: 73 IU/L (ref 44–121)
Bilirubin Total: 0.9 mg/dL (ref 0.0–1.2)
Bilirubin, Direct: 0.32 mg/dL (ref 0.00–0.40)
Total Protein: 6.3 g/dL (ref 6.0–8.5)

## 2023-11-25 LAB — HEMOGLOBIN A1C
Est. average glucose Bld gHb Est-mCnc: 171 mg/dL
Hgb A1c MFr Bld: 7.6 % — ABNORMAL HIGH (ref 4.8–5.6)

## 2023-11-25 LAB — VITAMIN D 1,25 DIHYDROXY
Vitamin D 1, 25 (OH)2 Total: 48 pg/mL
Vitamin D2 1, 25 (OH)2: <10 pg/mL
Vitamin D3 1, 25 (OH)2: 45 pg/mL

## 2023-11-25 LAB — TSH: TSH: 1.1 u[IU]/mL (ref 0.450–4.500)

## 2023-12-01 ENCOUNTER — Encounter: Payer: Self-pay | Admitting: Medical

## 2023-12-01 ENCOUNTER — Ambulatory Visit (INDEPENDENT_AMBULATORY_CARE_PROVIDER_SITE_OTHER): Admitting: Medical

## 2023-12-01 VITALS — BP 131/65 | HR 65 | Ht 70.0 in | Wt 185.6 lb

## 2023-12-01 DIAGNOSIS — R350 Frequency of micturition: Secondary | ICD-10-CM

## 2023-12-01 DIAGNOSIS — Z7984 Long term (current) use of oral hypoglycemic drugs: Secondary | ICD-10-CM

## 2023-12-01 DIAGNOSIS — E1169 Type 2 diabetes mellitus with other specified complication: Secondary | ICD-10-CM

## 2023-12-01 DIAGNOSIS — Z122 Encounter for screening for malignant neoplasm of respiratory organs: Secondary | ICD-10-CM

## 2023-12-01 DIAGNOSIS — E118 Type 2 diabetes mellitus with unspecified complications: Secondary | ICD-10-CM

## 2023-12-01 DIAGNOSIS — F17200 Nicotine dependence, unspecified, uncomplicated: Secondary | ICD-10-CM | POA: Diagnosis not present

## 2023-12-01 DIAGNOSIS — E785 Hyperlipidemia, unspecified: Secondary | ICD-10-CM

## 2023-12-01 DIAGNOSIS — Z1211 Encounter for screening for malignant neoplasm of colon: Secondary | ICD-10-CM

## 2023-12-01 LAB — MICROALBUMIN / CREATININE URINE RATIO
Creatinine,U: 32.1 mg/dL
Microalb Creat Ratio: UNDETERMINED mg/g (ref 0.0–30.0)
Microalb, Ur: 0.7 mg/dL (ref 0.0–1.9)

## 2023-12-01 NOTE — Progress Notes (Addendum)
 Subjective:    Patient ID: Alec [name], male    DOB: April 11, 1956, 68 y.o.   MRN: [id number]  HPI  Shermar Friedland is a 67 year old male with diabetes who presents with elevated A1c levels.  His A1c level increased to 7.6 three weeks ago. He has not been taking Januvia  for approximately six to eight months due to its high cost, which increased from $40 to over $300 out of pocket. He is currently on metformin  500 mg twice a day.  He has a history of prostate issues and has had radiation seeds implanted, which he believes may contribute to his urinary frequency and stool irregularities. His urination pattern is highly variable, with some days requiring frequent bathroom visits and other days not as much. Follows up with urologist routinely/yearly.  He had a colonoscopy in 2019 and was advised to repeat it in five years due to a history of polyps. He has a history of smoking and is due for a CT lung cancer screening.  He is on Crestor  for high cholesterol and has a history of  coronary calcifications noted on a previous CT scan. He has a diabetic eye exam scheduled for next Tuesday and had one last May. He sees a urologist and a cardiologist regularly.     Review of Systems  Constitutional:  Negative for chills, fatigue and fever.  HENT:  Negative for congestion, ear pain, nosebleeds and postnasal drip.   Respiratory:  Negative for cough, chest tightness, shortness of breath and wheezing.   Cardiovascular:  Negative for chest pain and palpitations.  Gastrointestinal:  Negative for abdominal pain, constipation, diarrhea and nausea.  Genitourinary:  Negative for dysuria, frequency and hematuria.  Musculoskeletal:  Negative for back pain and myalgias.  Skin:  Negative for rash.  Neurological:  Negative for dizziness, speech difficulty, weakness and light-headedness.  Hematological:  Negative for adenopathy. Does not bruise/bleed easily.  Psychiatric/Behavioral:  Negative for  behavioral problems and dysphoric mood. The patient is not nervous/anxious.    Past Medical History:  Diagnosis Date   Allergic rhinitis 05/20/2009   Qualifier: Diagnosis of   By: Myrtice Athens MD, Rufina Cough     IMO SNOMED Dx Update Oct 2024     Allergy    Anemia 05/06/2014   Arthritis    Back pain 05/01/2014   CAD (coronary artery disease) 09/08/2022   Cigarette smoker 03/10/2022   COLONIC POLYPS, HX OF 05/20/2009   Qualifier: Diagnosis of  By: Myrtice Athens MD, Rufina Cough    Diabetes (HCC) 07/09/2014   Diabetes mellitus without complication (HCC)    type 2   Diverticulitis of colon 05/20/2009   Qualifier: Diagnosis of  By: Myrtice Athens MD, Rufina Cough    GERD    History of colonic polyps 05/20/2009   Qualifier: Diagnosis of   By: Myrtice Athens MD, Rufina Cough     IMO SNOMED Dx Update Oct 2024     LEG PAIN, LEFT    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/10/2014   Malignant neoplasm of prostate (HCC) 07/08/2018   Mixed dyslipidemia 03/10/2022   Needs smoking cessation education 05/01/2014   Numbness of finger 10/10/2014   Pain in joint, forearm 05/01/2014   Pain in joint, upper arm 05/01/2014   Pain in the abdomen 05/01/2014   Prostate cancer (HCC)    Sinusitis, chronic 05/20/2009   Qualifier: Diagnosis of   By: Myrtice Athens MD, Rufina Cough     IMO SNOMED Dx Update Oct 2024     [facility]  Wellness examination 06/02/2014     Social History   Socioeconomic History   Marital status: Married    Spouse name: Not on file   Number of children: Not on file   Years of education: Not on file   Highest education level: 10th grade  Occupational History   Occupation: Merchandiser, retail   Tobacco Use   Smoking status: Every Day    Current packs/day: 0.75    Average packs/day: 0.8 packs/day for 40.0 years (30.0 ttl pk-yrs)    Types: Cigarettes   Smokeless tobacco: Never   Tobacco comments:    trying to quit, from 1 1/2 ppd to 13-14/day  Vaping Use   Vaping status: Never Used  Substance and Sexual Activity   Alcohol use: No   Drug  use: No   Sexual activity: Yes  Other Topics Concern   Not on file  Social History Narrative   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   Married, lives with wife (MM dx 2009)   Social Drivers of Health   Financial Resource Strain: Low Risk  (11/30/2023)   Overall Financial Resource Strain (CARDIA)    Difficulty of Paying Living Expenses: Not very hard  Food Insecurity: No Food Insecurity (11/30/2023)   Hunger Vital Sign    Worried About Running Out of Food in the Last Year: Never true    Ran Out of Food in the Last Year: Never true  Transportation Needs: No Transportation Needs (11/30/2023)   PRAPARE - Administrator, Civil Service (Medical): No    Lack of Transportation (Non-Medical): No  Physical Activity: Insufficiently Active (11/30/2023)   Exercise Vital Sign    Days of Exercise per Week: 1 day    Minutes of Exercise per Session: 130 min  Stress: No Stress Concern Present (11/30/2023)   [employer] of Occupational Health - Occupational Stress Questionnaire    Feeling of Stress : Only a little  Social Connections: Socially Integrated (11/30/2023)   Social Connection and Isolation Panel [NHANES]    Frequency of Communication with Friends and Family: More than three times a week    Frequency of Social Gatherings with Friends and Family: Once a week    Attends Religious Services: More than 4 times per year    Active Member of Clubs or Organizations: No    Attends Engineer, structural: More than 4 times per year    Marital Status: Married  Catering manager Violence: Not on file    Past Surgical History:  Procedure Laterality Date   COLONOSCOPY     FINGER SURGERY     PROSTATE BIOPSY     RADIOACTIVE SEED IMPLANT N/A 09/20/2018   Procedure: RADIOACTIVE SEED IMPLANT/BRACHYTHERAPY IMPLANT;  Surgeon: Homero Luster, MD;  Location: [facility];  Service: Urology;  Laterality: N/A;   SPACE OAR INSTILLATION N/A 09/20/2018   Procedure: SPACE  OAR INSTILLATION;  Surgeon: Homero Luster, MD;  Location: [facility];  Service: Urology;  Laterality: N/A;   SPINE SURGERY     lumbar surgery 2012.   STOMACH SURGERY      Family History  Problem Relation Age of Onset   Alzheimer's disease Mother    Heart disease Father    Parkinsonism Father    Alcohol abuse Brother    Colon cancer Neg Hx    Esophageal cancer Neg Hx    Liver cancer Neg Hx    Pancreatic cancer Neg Hx    Rectal cancer Neg Hx  Stomach cancer Neg Hx     No Known Allergies  Current Outpatient Medications on File Prior to Visit  Medication Sig Dispense Refill   Continuous Blood Gluc Receiver (DEXCOM G6 RECEIVER) DEVI Use as directed.  Dx code: E11.9 1 each 0   Continuous Blood Gluc Sensor (DEXCOM G6 SENSOR) MISC Use as directed to check glucose. Change every 10 days. 3 each 0   Continuous Blood Gluc Transmit (DEXCOM G6 TRANSMITTER) MISC Use as directed.  Dx code: E11.9 1 each 0   glucose blood (ONETOUCH VERIO) test strip Use as instructed 100 each 12   HYDROcodone -acetaminophen  (NORCO) 5-325 MG tablet Take 1 tablet by mouth every 6 (six) hours as needed for moderate pain. 8 tablet 0   hydrocortisone -pramoxine (PROCTOFOAM -HC) rectal foam Place 1 applicator rectally 2 (two) times daily. 10 g 0   metFORMIN  (GLUCOPHAGE ) 500 MG tablet Take 1 tablet (500 mg total) by mouth 2 (two) times daily with a meal. 180 tablet 0   mupirocin  ointment (BACTROBAN ) 2 % Apply 1 application topically 2 (two) times daily. 30 g 0   ONETOUCH DELICA LANCETS 33G MISC Use as directed. 100 each 12   rosuvastatin  (CRESTOR ) 20 MG tablet Take 1 tablet by mouth once daily 90 tablet 2   tamsulosin  (FLOMAX ) 0.4 MG CAPS capsule Take 1 capsule (0.4 mg total) by mouth 2 (two) times daily. 60 capsule 11   sitaGLIPtin  (JANUVIA ) 25 MG tablet Take 1 tablet (25 mg total) by mouth daily. (Patient not taking: Reported on 12/01/2023) 30 tablet 0   Current Facility-Administered Medications on File  Prior to Visit  Medication Dose Route Frequency Provider Last Rate Last Admin   hydrocortisone -pramoxine (PROCTOFOAM -HC) rectal foam 1 applicator  1 applicator Rectal BID Liviya Santini, PA-C        BP 131/65   Pulse 65   Ht 5\' 10"  (1.778 m)   Wt 185 lb 9.6 oz (84.2 kg)   SpO2 100%   BMI 26.63 kg/m        Objective:   Physical Exam  General Mental Status- Alert. General Appearance- Not in acute distress.   Skin General: Color- Normal Color. Moisture- Normal Moisture.  Neck  No JVD.  Chest and Lung Exam Auscultation: Breath Sounds:-Normal.  Cardiovascular Auscultation:Rythm- Regular. Murmurs & Other Heart Sounds:Auscultation of the heart reveals- No Murmurs.  Abdomen Inspection:-Inspeection Normal. Palpation/Percussion:Note:No mass. Palpation and Percussion of the abdomen reveal- Non Tender, Non Distended + BS, no rebound or guarding.   Neurologic Cranial Nerve exam:- CN III-XII intact(No nystagmus), symmetric smile. Strength:- 5/5 equal and symmetric strength both upper and lower extremities.    Lower ext- see quality metrics.     Assessment & Plan:   Patient Instructions  Type 2 diabetes mellitus with hyperglycemia A1c increased to 7.6% after discontinuing Januvia  due to cost. Loose stools possibly exacerbated by Metformin . - Send MyChart message with [facility] mail order pharmacy address for Januvia  prescription(pt willing to pay the $300 up front). - Perform urine microalbumin test. - Schedule metabolic panel and A1c for February 12, 2024. - Obtain diabetic eye exam report from [facility].  Hyperlipidemia Continues on rosuvastatin  due to calcifications on previous CT scan. - Continue rosuvastatin .  Urinary frequency Varies, possibly related to prostate cancer treatment. No current medication prescribed. - Contact urologist for follow-up appointment. - Discuss potential treatment options like Flomax  or Ditropan with urologist.  Hx of prostate  cancer Treated with radiation seeds. PSA was 0.06 eleven months ago. - Contact urologist for follow-up appointment.  Colon polyps Due for repeat colonoscopy as last was in 2019 with a 5-year follow-up recommendation. - Place referral for colonoscopy.  Tobacco use Encouraged to quit smoking. Previously declined cessation medication. - Encourage smoking cessation.  Follow up dat to be determined after future labs done in august 2025(cmp and a1c)   Sylvia Everts, PA-C

## 2023-12-01 NOTE — Patient Instructions (Addendum)
 Type 2 diabetes mellitus with hyperglycemia A1c increased to 7.6% after discontinuing Januvia  due to cost. Loose stools possibly exacerbated by Metformin . - Send MyChart message with [facility] mail order pharmacy address for Januvia  prescription(pt willing to pay the $300 up front). - Perform urine microalbumin test. - Schedule metabolic panel and A1c for February 12, 2024. - Obtain diabetic eye exam report from [facility].  Hyperlipidemia Continues on rosuvastatin  due to calcifications on previous CT scan. - Continue rosuvastatin .  Urinary frequency Varies, possibly related to prostate cancer treatment. No current medication prescribed. - Contact urologist for follow-up appointment. - Discuss potential treatment options like Flomax  or Ditropan with urologist.  Hx of prostate cancer Treated with radiation seeds. PSA was 0.06 eleven months ago. - Contact urologist for follow-up appointment.  Colon polyps Due for repeat colonoscopy as last was in 2019 with a 5-year follow-up recommendation. - Place referral for colonoscopy.  Tobacco use Encouraged to quit smoking. Previously declined cessation medication. - Encourage smoking cessation.  Follow up dat to be determined after future labs done in august 2025(cmp and a1c)

## 2023-12-02 ENCOUNTER — Ambulatory Visit: Payer: Self-pay | Admitting: Medical

## 2023-12-05 DIAGNOSIS — E119 Type 2 diabetes mellitus without complications: Secondary | ICD-10-CM | POA: Diagnosis not present

## 2023-12-05 DIAGNOSIS — H2513 Age-related nuclear cataract, bilateral: Secondary | ICD-10-CM | POA: Diagnosis not present

## 2023-12-05 DIAGNOSIS — H524 Presbyopia: Secondary | ICD-10-CM | POA: Diagnosis not present

## 2023-12-05 LAB — HM DIABETES EYE EXAM

## 2023-12-18 ENCOUNTER — Ambulatory Visit (HOSPITAL_BASED_OUTPATIENT_CLINIC_OR_DEPARTMENT_OTHER)
Admission: RE | Admit: 2023-12-18 | Discharge: 2023-12-18 | Disposition: A | Discharge status: Home / Self Care | Source: Ambulatory Visit | Attending: Medical | Admitting: Medical

## 2023-12-18 DIAGNOSIS — I700 Atherosclerosis of aorta: Secondary | ICD-10-CM | POA: Diagnosis not present

## 2023-12-18 DIAGNOSIS — R590 Localized enlarged lymph nodes: Secondary | ICD-10-CM | POA: Insufficient documentation

## 2023-12-18 DIAGNOSIS — Z122 Encounter for screening for malignant neoplasm of respiratory organs: Secondary | ICD-10-CM | POA: Insufficient documentation

## 2023-12-18 DIAGNOSIS — I2510 Atherosclerotic heart disease of native coronary artery without angina pectoris: Secondary | ICD-10-CM | POA: Insufficient documentation

## 2023-12-18 DIAGNOSIS — J439 Emphysema, unspecified: Secondary | ICD-10-CM | POA: Diagnosis not present

## 2023-12-18 DIAGNOSIS — F17210 Nicotine dependence, cigarettes, uncomplicated: Secondary | ICD-10-CM | POA: Diagnosis not present

## 2023-12-18 DIAGNOSIS — F17200 Nicotine dependence, unspecified, uncomplicated: Secondary | ICD-10-CM | POA: Insufficient documentation

## 2023-12-22 ENCOUNTER — Other Ambulatory Visit: Payer: Self-pay | Admitting: Cardiology

## 2023-12-22 ENCOUNTER — Other Ambulatory Visit: Payer: Self-pay

## 2023-12-22 MED ORDER — ROSUVASTATIN CALCIUM 20 MG PO TABS
20.0000 mg | ORAL_TABLET | Freq: Every day | ORAL | 2 refills | Status: AC
  Filled 2023-12-22: qty 90, 90d supply, fill #0
  Filled 2024-03-02: qty 90, 90d supply, fill #1
  Filled 2024-05-23: qty 90, 90d supply, fill #2

## 2023-12-25 ENCOUNTER — Ambulatory Visit (HOSPITAL_COMMUNITY): Admission: EM | Admit: 2023-12-25 | Discharge: 2023-12-25 | Disposition: A | Discharge status: Home / Self Care

## 2023-12-25 ENCOUNTER — Ambulatory Visit: Admission: EM | Admit: 2023-12-25 | Discharge: 2023-12-25 | Disposition: A | Discharge status: Home / Self Care

## 2023-12-25 ENCOUNTER — Ambulatory Visit: Payer: Self-pay

## 2023-12-25 ENCOUNTER — Other Ambulatory Visit (HOSPITAL_COMMUNITY): Payer: Self-pay

## 2023-12-25 ENCOUNTER — Ambulatory Visit (INDEPENDENT_AMBULATORY_CARE_PROVIDER_SITE_OTHER)

## 2023-12-25 ENCOUNTER — Encounter (HOSPITAL_COMMUNITY): Payer: Self-pay | Admitting: Emergency Medicine

## 2023-12-25 DIAGNOSIS — R053 Chronic cough: Secondary | ICD-10-CM

## 2023-12-25 DIAGNOSIS — R0781 Pleurodynia: Secondary | ICD-10-CM | POA: Diagnosis not present

## 2023-12-25 MED ORDER — BENZONATATE 100 MG PO CAPS
100.0000 mg | ORAL_CAPSULE | Freq: Three times a day (TID) | ORAL | 0 refills | Status: DC
  Filled 2023-12-25: qty 21, 7d supply, fill #0

## 2023-12-25 NOTE — ED Triage Notes (Signed)
 Pt reports fell on Friday. 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 C/o right flank pain esp when coughs or turns over when laying down.  \repots had cough for several months.

## 2023-12-25 NOTE — Telephone Encounter (Signed)
 Patient declined appointment reports that he went to urgent care already. He stated that they noticed an old fracture and told him everything else was fine. Just an FYI

## 2023-12-25 NOTE — ED Provider Notes (Signed)
 [facility]    CSN: [id number] Arrival date & time: 12/25/23  1222      History   Chief Complaint Chief Complaint  Patient presents with   Fall    HPI Alec [name] is a 68 y.o. male.   HPI Patient is a 68 year old male who presents to the urgent care today with concerns of posterior right-sided rib pain.  He states that on Friday, he was working on a ladder when he fell off and hit a pole on the right side.  He denies any head trauma and has not had any loss of consciousness, vomiting, or neurologic changes since then.  He reports that the pain is most prominent with coughing.  He does report being an active smoker.  He reports having a cough for the past several months.  He has been taking ibuprofen which helps somewhat with the pain but does not relieve it completely.  He denies any chest pain, shortness of breath, hemoptysis, or other concerns at this time. Past Medical History:  Diagnosis Date   Allergic rhinitis 05/20/2009   Qualifier: Diagnosis of   By: Myrtice Athens MD, Rufina Cough     IMO SNOMED Dx Update Oct 2024     Allergy    Anemia 05/06/2014   Arthritis    Back pain 05/01/2014   CAD (coronary artery disease) 09/08/2022   Cigarette smoker 03/10/2022   COLONIC POLYPS, HX OF 05/20/2009   Qualifier: Diagnosis of  By: Myrtice Athens MD, Rufina Cough    Diabetes (HCC) 07/09/2014   Diabetes mellitus without complication (HCC)    type 2   Diverticulitis of colon 05/20/2009   Qualifier: Diagnosis of  By: Myrtice Athens MD, Rufina Cough    GERD    History of colonic polyps 05/20/2009   Qualifier: Diagnosis of   By: Myrtice Athens MD, Rufina Cough     IMO SNOMED Dx Update Oct 2024     LEG PAIN, LEFT    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/10/2014   Malignant neoplasm of prostate (HCC) 07/08/2018   Mixed dyslipidemia 03/10/2022   Needs smoking cessation education 05/01/2014   Numbness of finger 10/10/2014   Pain in joint, forearm 05/01/2014   Pain in joint, upper arm 05/01/2014   Pain in the abdomen  05/01/2014   Prostate cancer (HCC)    Sinusitis, chronic 05/20/2009   Qualifier: Diagnosis of   By: Myrtice Athens MD, Rufina Cough     IMO SNOMED Dx Update Oct 2024     SMOKER    Wellness examination 06/02/2014    Patient Active Problem List   Diagnosis Date Noted   CAD (coronary artery disease) 09/08/2022   Cigarette smoker 03/10/2022   Mixed dyslipidemia 03/10/2022   Allergy 03/09/2022   Arthritis 03/09/2022   Diabetes mellitus without complication (HCC) 03/09/2022   DIVERTICULITIS, HX OF 03/09/2022   Malignant neoplasm of prostate (HCC) 07/08/2018   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/10/2014   Numbness of finger 10/10/2014   Diabetes (HCC) 07/09/2014   Wellness examination 06/02/2014   Anemia 05/06/2014   Pain in joint, forearm 05/01/2014   Pain in joint, upper arm 05/01/2014   Back pain 05/01/2014   Pain in the abdomen 05/01/2014   Needs smoking cessation education 05/01/2014   SMOKER 05/20/2009   Sinusitis, chronic 05/20/2009   Allergic rhinitis 05/20/2009   GERD 05/20/2009   LEG PAIN, LEFT 05/20/2009   History of colonic polyps 05/20/2009   Diverticulitis of colon 05/20/2009    Past Surgical History:  Procedure Laterality Date  COLONOSCOPY     FINGER SURGERY     PROSTATE BIOPSY     RADIOACTIVE SEED IMPLANT N/A 09/20/2018   Procedure: RADIOACTIVE SEED IMPLANT/BRACHYTHERAPY IMPLANT;  Surgeon: Homero Luster, MD;  Location: [facility];  Service: Urology;  Laterality: N/A;   SPACE OAR INSTILLATION N/A 09/20/2018   Procedure: SPACE OAR INSTILLATION;  Surgeon: Homero Luster, MD;  Location: [facility];  Service: Urology;  Laterality: N/A;   SPINE SURGERY     lumbar surgery 2012.   STOMACH SURGERY         Home Medications    Prior to Admission medications   Medication Sig Start Date End Date Taking? Authorizing Provider  Continuous Blood Gluc Receiver (DEXCOM G6 RECEIVER) DEVI Use as directed.  Dx code: E11.9 12/07/21   Saguier, Gaylin Ke, PA-C  Continuous Blood  Gluc Sensor (DEXCOM G6 SENSOR) MISC Use as directed to check glucose. Change every 10 days. 12/07/21   Saguier, Gaylin Ke, PA-C  Continuous Blood Gluc Transmit (DEXCOM G6 TRANSMITTER) MISC Use as directed.  Dx code: E11.9 12/07/21   Saguier, Gaylin Ke, PA-C  glucose blood [facility] VERIO) test strip Use as instructed 08/13/14   Saguier, Gaylin Ke, PA-C  HYDROcodone -acetaminophen  (NORCO) 5-325 MG tablet Take 1 tablet by mouth every 6 (six) hours as needed for moderate pain. 11/25/21   Saguier, Gaylin Ke, PA-C  hydrocortisone -pramoxine (PROCTOFOAM -HC) rectal foam Place 1 applicator rectally 2 (two) times daily. 11/25/19   Saguier, Gaylin Ke, PA-C  metFORMIN  (GLUCOPHAGE ) 500 MG tablet Take 1 tablet (500 mg total) by mouth 2 (two) times daily with a meal. 09/12/23   Wendling, Shellie Dials, DO  mupirocin  ointment (BACTROBAN ) 2 % Apply 1 application topically 2 (two) times daily. 02/24/20   Wurst, [place], PA-C  ONETOUCH DELICA LANCETS 33G MISC Use as directed. 07/30/14   Saguier, Gaylin Ke, PA-C  rosuvastatin  (CRESTOR ) 20 MG tablet Take 1 tablet by mouth once daily 12/22/23   Revankar, Rajan R, MD  sitaGLIPtin  (JANUVIA ) 25 MG tablet Take 1 tablet (25 mg total) by mouth daily. Patient not taking: Reported on 12/01/2023 11/07/23   Saguier, Gaylin Ke, PA-C  tamsulosin  (FLOMAX ) 0.4 MG CAPS capsule Take 1 capsule (0.4 mg total) by mouth 2 (two) times daily. 12/15/22   Trent Frizzle, MD    Family History Family History  Problem Relation Age of Onset   Alzheimer's disease Mother    Heart disease Father    Parkinsonism Father    Alcohol abuse Brother    Colon cancer Neg Hx    Esophageal cancer Neg Hx    Liver cancer Neg Hx    Pancreatic cancer Neg Hx    Rectal cancer Neg Hx    Stomach cancer Neg Hx     Social History Social History   Tobacco Use   Smoking status: Every Day    Current packs/day: 0.75    Average packs/day: 0.8 packs/day for 40.0 years (30.0 ttl pk-yrs)    Types: Cigarettes   Smokeless tobacco: Never    Tobacco comments:    trying to quit, from 1 1/2 ppd to 13-14/day  Vaping Use   Vaping status: Never Used  Substance Use Topics   Alcohol use: No   Drug use: No     Allergies   Patient has no known allergies.   Review of Systems Review of Systems See HPI for relevant ROS.  Physical Exam Triage Vital Signs ED Triage Vitals  Encounter Vitals Group     BP 12/25/23 1309 (!) 144/80  Girls Systolic BP Percentile --      Girls Diastolic BP Percentile --      Boys Systolic BP Percentile --      Boys Diastolic BP Percentile --      Pulse Rate 12/25/23 1309 62     Resp 12/25/23 1309 16     Temp 12/25/23 1309 97.9 F (36.6 C)     Temp Source 12/25/23 1309 Oral     SpO2 12/25/23 1309 95 %     Weight --      Height --      Head Circumference --      Peak Flow --      Pain Score 12/25/23 1307 2     Pain Loc --      Pain Education --      Exclude from Growth Chart --    No data found.  Updated Vital Signs BP (!) 144/80 (BP Location: Right Arm)   Pulse 62   Temp 97.9 F (36.6 C) (Oral)   Resp 16   SpO2 95%   Visual Acuity Right Eye Distance:   Left Eye Distance:   Bilateral Distance:    Right Eye Near:   Left Eye Near:    Bilateral Near:     Physical Exam General: Alert and oriented, well-developed/well-nourished, calm, cooperative, no acute distress HEENT: Normocephalic atraumatic, moist mucous membranes, no scleral icterus, trachea midline Lungs: Speaking full sentences, non-labored respirations, no distress, clear to auscultation bilaterally Heart: Regular rate and rhythm Abdomen:  Soft, nondistended Musculoskeletal: Some pain with movement of the trunk, tenderness to palpation of the right posterior lower ribs Neurologic: Awake, A&O x4, gait normal Integumentary: Warm, dry, normal for ethnicity, intact, no rash Psychiatric: Appropriate mood & affect  UC Treatments / Results  Labs (all labs ordered are listed, but only abnormal results are  displayed) Labs Reviewed - No data to display  EKG   Radiology No results found.  Procedures Procedures (including critical care time)  Medications Ordered in UC Medications - No data to display  Initial Impression / Assessment and Plan / UC Course  I have reviewed the triage vital signs and the nursing notes.  Pertinent labs & imaging results that were available during my care of the patient were reviewed by me and considered in my medical decision making (see chart for details).    Presents with posterior right-sided rib pain.  Differential diagnosis includes: Sprain, fracture, dislocation, contusion, hematoma, pneumothorax, PE, pyelonephritis, nephrolithiasis, including other diagnoses.  History obtained from: Patient.  Plan at this time/rationale: Chest x-ray with right ribs.    All ordered tests including imaging and labs were independently reviewed and interpreted by myself. Notable findings: Possible old versus new rib fracture of the right posterior rib.  No pneumothorax.  Plan: Patient presents with posterior right-sided rib pain.  X-ray was obtained and radiologist reports old rib fracture of the seventh right posterior rib.  No pneumothorax.  Patient can add Tylenol  to the ibuprofen that he is already been taking to help with pain.  Provided patient with an incentive spirometer to help decrease risk of atelectasis/pneumonia.  Prescribe patient benzonatate  to help with cough. We discussed return precautions, symptomatic treatment, and follow up with primary care doctor within 1 week as needed for further evaluation, and the patient demonstrated understanding and agreement with this plan.   Disposition: Stable to discharge home.   All questions answered to the best of this examiner's ability. Advised to f/u with PCP for  further eval and/or reassessment. Patient/parent agrees to plan.  An appropriate evaluation has been performed, and in my medical judgment there is  currently no evidence of an immediate life-threatening or surgical condition. Discharge is therefore indicated at this time.  This document was created using the aid of voice recognition Scientist, clinical (histocompatibility and immunogenetics).   Final Clinical Impressions(s) / UC Diagnoses   Final diagnoses:  Rib pain on right side   Discharge Instructions   None    ED Prescriptions   None    PDMP not reviewed this encounter.   Edna Gouty, PA-C 12/25/23 1656

## 2023-12-25 NOTE — Discharge Instructions (Addendum)
 We have sent in a medication to help with your cough.  Please use the incentive spirometer multiple times per day to make sure you are taking deep breaths.  We recommend following up with your primary care provider if symptoms are not improving.  You can add Tylenol  to the ibuprofen that you have been taking to help with your pain.  Please return or go to the emergency department if you have any chest pain, shortness of breath, coughing up blood, or if you have any other concerns.

## 2023-12-25 NOTE — Telephone Encounter (Signed)
 FYI Only or Action Required?: FYI only for provider  Patient was last seen in primary care on 12/01/2023 by Sylvia Everts, PA-C. Called Nurse Triage reporting Fall. Symptoms began several days ago. Interventions attempted: Nothing. Symptoms are: gradually worsening.  Triage Disposition: See HCP Within 4 Hours (Or PCP Triage) - Patient states he is already sitting outside of [facility] and is going to walk in now to be seen.  Patient/caregiver understands and will follow disposition?: Yes       Copied from CRM [id number]. Topic: Clinical - Red Word Triage >> Dec 25, 2023 11:20 AM Everlene Hobby D wrote: Patient thinks he need a x-ray on the right side of back. He says he fell off a ladder last Friday about 3 or 4 ft. He hit a pole when falling. He is experiencing pain when he coughs. Reason for Disposition  [1] MODERATE weakness (i.e., interferes with work, school, normal activities) AND [2] new-onset or worsening    No weakness but severe pain when coughing, denies open lacerations  Answer Assessment - Initial Assessment Questions 1. MECHANISM: How did the fall happen?     Patient fell off of ladder on Friday 3. ONSET: When did the fall happen? (e.g., minutes, hours, or days ago)     Friday 4. LOCATION: What part of the body hit the ground? (e.g., back, buttocks, head, hips, knees, hands, head, stomach)     On back 5. INJURY: Did you hurt (injure) yourself when you fell? If Yes, ask: What did you injure? Tell me more about this? (e.g., body area; type of injury; pain severity)     No open cuts, but patient states he is having severe cough  6. PAIN: Is there any pain? If Yes, ask: How bad is the pain? (e.g., Scale 1-10; or mild,  moderate, severe)   - NONE (0): No pain   - MILD (1-3): Doesn't interfere with normal activities    - MODERATE (4-7): Interferes with normal activities or awakens from sleep    - SEVERE (8-10): Excruciating pain, unable to do any normal  activities      Hurts bad when coughing 7. SIZE: For cuts, bruises, or swelling, ask: How large is it? (e.g., inches or centimeters)      Patient unable to report 9. OTHER SYMPTOMS: Do you have any other symptoms? (e.g., dizziness, fever, weakness; new onset or worsening).      Pain when coughing 10. CAUSE: What do you think caused the fall (or falling)? (e.g., tripped, dizzy spell)       Patient fell off ladder  Protocols used: Falls and [facility]

## 2024-01-04 NOTE — Progress Notes (Signed)
 [facility] Quality Team Note  Name: Alec [name] Date of Birth: Aug 08, 1955 MRN: [id number] Date: 01/04/2024  [facility] Quality Team has reviewed this patient's chart, please see recommendations below:  [facility] Quality Other; (CHART REVIEWED FOR KIDNEY HEALTH EVALUATION IN DIABETICS. ABSTRACTED LABS TO CLOSE GAP)

## 2024-01-07 NOTE — Progress Notes (Unsigned)
 Alec [name], male    DOB: 06/24/1956    MRN: [id number]   Brief patient profile:  39  yowm active smoker  referred to pulmonary clinic in [place]  01/09/2024 by Alec [name]  for copd eval.    Seen by Alec [name]  09/2019 with min   copd clinically and did not rec further w/u or maint rx, just focus on smoking cessation.   History of Present Illness  01/09/2024  Pulmonary/ 1st office eval/ Alec [name] / [place] Office  Chief Complaint  Patient presents with   Establish Care  Dyspnea:  Not limited by breathing from desired activities   Cough: some in am / slt brown intermittently  Sleep: flat bed with one pillow s resp cc  SABA use: none  02: none Uses lot of vicks'  No obvious day to day or daytime pattern/variability or assoc excess/ purulent sputum or mucus plugs or hemoptysis or cp or chest tightness, subjective wheeze or overt   hb symptoms.    Also denies any obvious fluctuation of symptoms with weather or environmental changes or other aggravating or alleviating factors except as outlined above   No unusual exposure hx or h/o childhood pna/ asthma or knowledge of premature birth.  Current Allergies, Complete Past Medical History, Past Surgical History, Family History, and Social History were reviewed in [employer] record.  ROS  The following are not active complaints unless bolded Hoarseness, sore throat, dysphagia, dental problems, itching, sneezing,  nasal congestion or discharge of excess mucus or purulent secretions, ear ache,   fever, chills, sweats, unintended wt loss or wt gain, classically pleuritic or exertional cp,  orthopnea pnd or arm/hand swelling  or leg swelling, presyncope, palpitations, abdominal pain, anorexia, nausea, vomiting, diarrhea  or change in bowel habits or change in bladder habits, change in stools or change in urine, dysuria, hematuria,  rash, arthralgias, visual complaints, headache, numbness, weakness or ataxia or problems  with walking or coordination,  change in mood or  memory.            Outpatient Medications Prior to Visit  Medication Sig Dispense Refill   benzonatate  (TESSALON ) 100 MG capsule Take 1 capsule (100 mg total) by mouth every 8 (eight) hours. 21 capsule 0   Continuous Blood Gluc Receiver (DEXCOM G6 RECEIVER) DEVI Use as directed.  Dx code: E11.9 1 each 0   Continuous Blood Gluc Sensor (DEXCOM G6 SENSOR) MISC Use as directed to check glucose. Change every 10 days. 3 each 0   Continuous Blood Gluc Transmit (DEXCOM G6 TRANSMITTER) MISC Use as directed.  Dx code: E11.9 1 each 0   glucose blood (ONETOUCH VERIO) test strip Use as instructed 100 each 12   HYDROcodone -acetaminophen  (NORCO) 5-325 MG tablet Take 1 tablet by mouth every 6 (six) hours as needed for moderate pain. 8 tablet 0   hydrocortisone -pramoxine (PROCTOFOAM -HC) rectal foam Place 1 applicator rectally 2 (two) times daily. 10 g 0   metFORMIN  (GLUCOPHAGE ) 500 MG tablet Take 1 tablet (500 mg total) by mouth 2 (two) times daily with a meal. 180 tablet 0   mupirocin  ointment (BACTROBAN ) 2 % Apply 1 application topically 2 (two) times daily. 30 g 0   ONETOUCH DELICA LANCETS 33G MISC Use as directed. 100 each 12   rosuvastatin  (CRESTOR ) 20 MG tablet Take 1 tablet by mouth once daily 90 tablet 2   sitaGLIPtin  (JANUVIA ) 25 MG tablet Take 1 tablet (25 mg total) by mouth daily. 30 tablet 0  tamsulosin  (FLOMAX ) 0.4 MG CAPS capsule Take 1 capsule (0.4 mg total) by mouth 2 (two) times daily. 60 capsule 11   Facility-Administered Medications Prior to Visit  Medication Dose Route Frequency Provider Last Rate Last Admin   hydrocortisone -pramoxine (PROCTOFOAM -HC) rectal foam 1 applicator  1 applicator Rectal BID Alec [name]        Past Medical History:  Diagnosis Date   Allergic rhinitis 05/20/2009   Qualifier: Diagnosis of   By: Alec [name]     IMO SNOMED Dx Update Oct 2024     Allergy    Anemia 05/06/2014   Arthritis     Back pain 05/01/2014   CAD (coronary artery disease) 09/08/2022   Cigarette smoker 03/10/2022   COLONIC POLYPS, HX OF 05/20/2009   Qualifier: Diagnosis of  By: Alec [name]    Diabetes (HCC) 07/09/2014   Diabetes mellitus without complication (HCC)    type 2   Diverticulitis of colon 05/20/2009   Qualifier: Diagnosis of  By: Alec [name]    GERD    History of colonic polyps 05/20/2009   Qualifier: Diagnosis of   By: Alec [name]     IMO SNOMED Dx Update Oct 2024     LEG PAIN, LEFT    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/10/2014   Malignant neoplasm of prostate (HCC) 07/08/2018   Mixed dyslipidemia 03/10/2022   Needs smoking cessation education 05/01/2014   Numbness of finger 10/10/2014   Pain in joint, forearm 05/01/2014   Pain in joint, upper arm 05/01/2014   Pain in the abdomen 05/01/2014   Prostate cancer (HCC)    Sinusitis, chronic 05/20/2009   Qualifier: Diagnosis of   By: Alec [name]     IMO SNOMED Dx Update Oct 2024     SMOKER    Wellness examination 06/02/2014      Objective:     BP 132/66   Pulse 74   Wt 179 lb 6.4 oz (81.4 kg)   SpO2 98% Comment: RA  BMI 25.74 kg/m   SpO2: 98 % (RA)  Amb stoic wm nad    HEENT : Oropharynx  lear   Nasal turbinates nl    NECK :  without  apparent JVD/ palpable Nodes/TM    LUNGS: no acc muscle use,  Min barrel  contour chest wall with bilateral  slightly decreased bs s audible wheeze and  without cough on insp or exp maneuvers and min  Hyperresonant  to  percussion bilaterally    CV:  RRR  no s3 or murmur or increase in P2, and no edema   ABD:  soft and nontender with pos end  insp Hoover's  in the supine position.  No bruits or organomegaly appreciated   MS:  Nl gait/ ext warm without deformities Or obvious joint restrictions  calf tenderness, cyanosis or clubbing     SKIN: warm and dry without lesions    NEURO:  alert, approp, nl sensorium with  no motor or cerebellar deficits apparent.          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 /  Alpha one AT phenotype - PFTs rec 01/09/2024   I agree with Dr Alec [name] that his copd is mild and not causing limiting doe with no tendency to aecopd but I would rec pfts on basis of  mod emphysema findings on ldsct so we can assess impact on pfts and give him a reasonable prognosis based on the Fletcher curve  basically   indicates that  if you quit smoking when your best day FEV1 is still well preserved (as is still likley   the case here)  it is highly unlikely you will progress to severe disease and informed the patient there was  no medication on the market that has proven to alter the curve/ its downward trajectory  or the likelihood of progression of their disease(unlike other chronic medical conditions such as atheroclerosis where we do think we can change the natural hx with risk reducing meds)    Therefore stopping smoking and maintaining abstinence are  the most important aspects of his  care, not choice of inhalers or for that matter, Pulmonary doctors.   Treatment other than smoking cessation  is entirely directed by severity of symptoms and focused also on reducing exacerbations, not attempting to change the natural history of the disease. Should either issue become problematic, then escalation of maintenance and prn resp meds may be warranted the punishment needs to fit the crime    Discussed in detail all the  indications, usual  risks and alternatives  relative to the benefits with patient who agrees to proceed with w/u as outlined.     F/u can be prn          Each maintenance medication was reviewed in detail including emphasizing most importantly the difference between maintenance and prns and under what circumstances the prns are to be triggered using an action plan format where appropriate.  Total time for  H and P, chart review, counseling and generating customized AVS unique to this office visit / same day charting = 45 min new pt eval           Ozell America, MD 01/09/2024

## 2024-01-09 ENCOUNTER — Encounter: Payer: Self-pay | Admitting: Internal Medicine

## 2024-01-09 ENCOUNTER — Ambulatory Visit: Admitting: Internal Medicine

## 2024-01-09 VITALS — BP 132/66 | HR 74 | Wt 179.4 lb

## 2024-01-09 DIAGNOSIS — J449 Chronic obstructive pulmonary disease, unspecified: Secondary | ICD-10-CM | POA: Insufficient documentation

## 2024-01-09 NOTE — Patient Instructions (Addendum)
 The  key to slowing emphysema is stop smoking completely before smoking completely stops you!  My office will be contacting you by phone for referral to [facility]  for PFTs - if you don't hear back from my office within one week please call us  back or notify us  thru MyChart and we'll address it right away.   Please remember to go to the lab department   for your tests - we will call you with the results when they are available.     Pulmonary follow up will be determined based on your results.

## 2024-01-09 NOTE — Assessment & Plan Note (Signed)
 Active smoker - LDSCT 12/18/23 Mild to moderate paraseptal and centrilobular emphysema - Allergy screen 01/09/2024 >  Eos 0. /  Alpha one AT phenotype - PFTs rec 01/09/2024   I agree with Dr Meade that his copd is mild and not causing limiting doe with no tendency to aecopd but I would rec pfts on basis of mod emphysema findings on ldsct so we can assess impact on pfts and give him a reasonable prognosis based on the Fletcher curve  basically   indicates that  if you quit smoking when your best day FEV1 is still well preserved (as is still likley   the case here)  it is highly unlikely you will progress to severe disease and informed the patient there was  no medication on the market that has proven to alter the curve/ its downward trajectory  or the likelihood of progression of their disease(unlike other chronic medical conditions such as atheroclerosis where we do think we can change the natural hx with risk reducing meds)    Therefore stopping smoking and maintaining abstinence are  the most important aspects of his  care, not choice of inhalers or for that matter, Pulmonary doctors.   Treatment other than smoking cessation  is entirely directed by severity of symptoms and focused also on reducing exacerbations, not attempting to change the natural history of the disease. Should either issue become problematic, then escalation of maintenance and prn resp meds may be warranted the punishment needs to fit the crime    Discussed in detail all the  indications, usual  risks and alternatives  relative to the benefits with patient who agrees to proceed with w/u as outlined.     F/u can be prn          Each maintenance medication was reviewed in detail including emphasizing most importantly the difference between maintenance and prns and under what circumstances the prns are to be triggered using an action plan format where appropriate.  Total time for H and P, chart review, counseling and generating  customized AVS unique to this office visit / same day charting = 45 min new pt eval

## 2024-01-12 ENCOUNTER — Ambulatory Visit: Payer: Self-pay | Admitting: Internal Medicine

## 2024-01-12 LAB — CBC WITH DIFFERENTIAL/PLATELET
Basophils Absolute: 0.1 x10E3/uL (ref 0.0–0.2)
Basos: 1 %
EOS (ABSOLUTE): 0.1 x10E3/uL (ref 0.0–0.4)
Eos: 1 %
Hematocrit: 39.6 % (ref 37.5–51.0)
Hemoglobin: 11.8 g/dL — ABNORMAL LOW (ref 13.0–17.7)
Immature Grans (Abs): 0 x10E3/uL (ref 0.0–0.1)
Immature Granulocytes: 0 %
Lymphocytes Absolute: 1.6 x10E3/uL (ref 0.7–3.1)
Lymphs: 17 %
MCH: 20.3 pg — ABNORMAL LOW (ref 26.6–33.0)
MCHC: 29.8 g/dL — ABNORMAL LOW (ref 31.5–35.7)
MCV: 68 fL — ABNORMAL LOW (ref 79–97)
Monocytes Absolute: 0.7 x10E3/uL (ref 0.1–0.9)
Monocytes: 7 %
Neutrophils Absolute: 6.9 x10E3/uL (ref 1.4–7.0)
Neutrophils: 74 %
Platelets: 231 x10E3/uL (ref 150–450)
RBC: 5.81 x10E6/uL — ABNORMAL HIGH (ref 4.14–5.80)
RDW: 17.2 % — ABNORMAL HIGH (ref 11.6–15.4)
WBC: 9.3 x10E3/uL (ref 3.4–10.8)

## 2024-01-12 LAB — ALPHA-1-ANTITRYPSIN PHENOTYP: A-1 Antitrypsin: 106 mg/dL (ref 101–187)

## 2024-01-16 NOTE — Progress Notes (Signed)
 Relayed result to pt confirmed understanding, NFN

## 2024-01-17 ENCOUNTER — Encounter: Payer: Self-pay | Admitting: Pediatrics

## 2024-01-23 ENCOUNTER — Ambulatory Visit (HOSPITAL_COMMUNITY)
Admission: RE | Admit: 2024-01-23 | Discharge: 2024-01-23 | Disposition: A | Discharge status: Home / Self Care | Source: Ambulatory Visit | Attending: Internal Medicine | Admitting: Internal Medicine

## 2024-01-23 DIAGNOSIS — J449 Chronic obstructive pulmonary disease, unspecified: Secondary | ICD-10-CM | POA: Insufficient documentation

## 2024-01-23 LAB — PULMONARY FUNCTION TEST
DL/VA % pred: 100 %
DL/VA: 4.07 ml/min/mmHg/L
DLCO cor % pred: 80 %
DLCO cor: 21.11 ml/min/mmHg
DLCO unc % pred: 73 %
DLCO unc: 19.23 ml/min/mmHg
FEF 25-75 Post: 1.52 L/s
FEF 25-75 Pre: 1.55 L/s
FEF2575-%Change-Post: -1 %
FEF2575-%Pred-Post: 58 %
FEF2575-%Pred-Pre: 58 %
FEV1-%Change-Post: 0 %
FEV1-%Pred-Post: 70 %
FEV1-%Pred-Pre: 70 %
FEV1-Post: 2.38 L
FEV1-Pre: 2.38 L
FEV1FVC-%Change-Post: -1 %
FEV1FVC-%Pred-Pre: 93 %
FEV6-%Change-Post: 1 %
FEV6-%Pred-Post: 80 %
FEV6-%Pred-Pre: 79 %
FEV6-Post: 3.46 L
FEV6-Pre: 3.39 L
FEV6FVC-%Change-Post: 0 %
FEV6FVC-%Pred-Post: 104 %
FEV6FVC-%Pred-Pre: 104 %
FVC-%Change-Post: 2 %
FVC-%Pred-Post: 77 %
FVC-%Pred-Pre: 75 %
FVC-Post: 3.5 L
FVC-Pre: 3.42 L
Post FEV1/FVC ratio: 68 %
Post FEV6/FVC ratio: 99 %
Pre FEV1/FVC ratio: 69 %
Pre FEV6/FVC Ratio: 99 %
RV % pred: 104 %
RV: 2.5 L
TLC % pred: 87 %
TLC: 6.17 L

## 2024-01-23 MED ORDER — ALBUTEROL SULFATE (2.5 MG/3ML) 0.083% IN NEBU
2.5000 mg | INHALATION_SOLUTION | Freq: Once | RESPIRATORY_TRACT | Status: AC
  Administered 2024-01-23: 2.5 mg via RESPIRATORY_TRACT

## 2024-01-24 NOTE — Progress Notes (Signed)
 I called the pt and there was no answer- LMTCB. ?

## 2024-01-29 ENCOUNTER — Encounter: Payer: Self-pay | Admitting: *Deleted

## 2024-02-13 ENCOUNTER — Telehealth: Payer: Self-pay | Admitting: *Deleted

## 2024-02-13 NOTE — Telephone Encounter (Signed)
 Dr. Suzann,  This pt is scheduled with you on 02/28/2024.  He has a h/o CAD but no recent studies for this dx except a CT Calcium  score of >1900 from 2021.  After this result there seems to be no further w/u/, so we will need a cardiac clearance to clear him for care.  Best regards,  Norleen EMERSON Schillings

## 2024-02-14 ENCOUNTER — Telehealth: Payer: Self-pay

## 2024-02-14 ENCOUNTER — Encounter: Payer: Self-pay | Admitting: Pediatrics

## 2024-02-14 ENCOUNTER — Other Ambulatory Visit (HOSPITAL_COMMUNITY): Payer: Self-pay

## 2024-02-14 ENCOUNTER — Ambulatory Visit (AMBULATORY_SURGERY_CENTER)

## 2024-02-14 VITALS — Ht 70.0 in | Wt 179.0 lb

## 2024-02-14 DIAGNOSIS — Z860100 Personal history of colon polyps, unspecified: Secondary | ICD-10-CM

## 2024-02-14 MED ORDER — PEG 3350-KCL-NA BICARB-NACL 420 G PO SOLR
4000.0000 mL | Freq: Once | ORAL | 0 refills | Status: AC
  Filled 2024-02-14: qty 4000, 1d supply, fill #0

## 2024-02-14 NOTE — Telephone Encounter (Signed)
I left a message for the patient to call our office to schedule a tele visit for pre-op 

## 2024-02-14 NOTE — Telephone Encounter (Signed)
  [facility] Pre-operative Risk Assessment     Request for surgical clearance:     Endoscopy Procedure  What type of surgery is being performed?     Colonoscopy  When is this surgery scheduled?     02/28/24  What type of clearance is required ?   Medical  Are there any medications that need to be held prior to surgery and how long? N/A   Practice name and name of physician performing surgery?      [facility]  What is your office phone and fax number?      Phone- [phone]  Fax- [phone]  Anesthesia type (None, local, MAC, general) ?       MAC   Please route your response to [facility], Charity fundraiser.

## 2024-02-14 NOTE — Telephone Encounter (Signed)
 Patient called and stated that he seen a cardiologist back in April 29 th of this year and is not sure why we are not able to see that. Please advise.

## 2024-02-14 NOTE — Telephone Encounter (Signed)
   Name: Alec [name]  DOB: 1956/01/07  MRN: [id number]  Primary Cardiologist: None   Preoperative team, please contact this patient and set up a phone call appointment for further preoperative risk assessment. Please obtain consent and complete medication review. Last seen by Dr. Edwyna on 11/07/2023. Thank you for your help.  I confirm that guidance regarding antiplatelet and oral anticoagulation therapy has been completed and, if necessary, noted below.  Patient is not on anticoagulation or antiplatelet per review of medical record in Epic.    I also confirmed the patient resides in the state of [place] . As per [employer] telemedicine laws, the patient must reside in the state in which the provider is licensed.   Alec Satterfield, NP 02/14/2024, 8:42 AM [facility]

## 2024-02-14 NOTE — Telephone Encounter (Signed)
 Apt visit is visible in mychart per Norleen pt will need clearance before proceeding. Dr. Suzann has already requested this to be obtained.    Thank you

## 2024-02-14 NOTE — Progress Notes (Signed)

## 2024-02-14 NOTE — Telephone Encounter (Signed)
 PV was completed today. Pt is aware if we are unable to obtain clearance prior to his procedure he may have to be r/s.

## 2024-02-14 NOTE — Telephone Encounter (Signed)
 Cardiac clearance has been requested, see 8/6 telephone telephone encounter for additional details.

## 2024-02-16 ENCOUNTER — Telehealth: Payer: Self-pay

## 2024-02-16 NOTE — Telephone Encounter (Signed)
  Patient Consent for Virtual Visit        Alec [name] has provided verbal consent on 02/16/2024 for a virtual visit (video or telephone).   CONSENT FOR VIRTUAL VISIT FOR:  Alec [name]  By participating in this virtual visit I agree to the following:  I hereby voluntarily request, consent and authorize [facility] and its employed or contracted physicians, physician assistants, nurse practitioners or other licensed health care professionals (the Practitioner), to provide me with telemedicine health care services (the "Services) as deemed necessary by the treating Practitioner. I acknowledge and consent to receive the Services by the Practitioner via telemedicine. I understand that the telemedicine visit will involve communicating with the Practitioner through live audiovisual communication technology and the disclosure of certain medical information by electronic transmission. I acknowledge that I have been given the opportunity to request an in-person assessment or other available alternative prior to the telemedicine visit and am voluntarily participating in the telemedicine visit.  I understand that I have the right to withhold or withdraw my consent to the use of telemedicine in the course of my care at any time, without affecting my right to future care or treatment, and that the Practitioner or I may terminate the telemedicine visit at any time. I understand that I have the right to inspect all information obtained and/or recorded in the course of the telemedicine visit and may receive copies of available information for a reasonable fee.  I understand that some of the potential risks of receiving the Services via telemedicine include:  Delay or interruption in medical evaluation due to technological equipment failure or disruption; Information transmitted may not be sufficient (e.g. poor resolution of images) to allow for appropriate medical decision making by the  Practitioner; and/or  In rare instances, security protocols could fail, causing a breach of personal health information.  Furthermore, I acknowledge that it is my responsibility to provide information about my medical history, conditions and care that is complete and accurate to the best of my ability. I acknowledge that Practitioner's advice, recommendations, and/or decision may be based on factors not within their control, such as incomplete or inaccurate data provided by me or distortions of diagnostic images or specimens that may result from electronic transmissions. I understand that the practice of medicine is not an exact science and that Practitioner makes no warranties or guarantees regarding treatment outcomes. I acknowledge that a copy of this consent can be made available to me via my patient portal [facility] MyChart), or I can request a printed copy by calling the office of  [facility].    I understand that my insurance will be billed for this visit.   I have read or had this consent read to me. I understand the contents of this consent, which adequately explains the benefits and risks of the Services being provided via telemedicine.  I have been provided ample opportunity to ask questions regarding this consent and the Services and have had my questions answered to my satisfaction. I give my informed consent for the services to be provided through the use of telemedicine in my medical care

## 2024-02-16 NOTE — Telephone Encounter (Signed)
 Patient has been scheduled for a pre-op tele visit on 02/21/24 with Katlyn West, NP.

## 2024-02-20 ENCOUNTER — Ambulatory Visit
Admission: EM | Admit: 2024-02-20 | Discharge: 2024-02-20 | Disposition: A | Discharge status: Home / Self Care | Attending: Nurse Practitioner | Admitting: Nurse Practitioner

## 2024-02-20 DIAGNOSIS — K5792 Diverticulitis of intestine, part unspecified, without perforation or abscess without bleeding: Secondary | ICD-10-CM | POA: Diagnosis not present

## 2024-02-20 MED ORDER — AMOXICILLIN-POT CLAVULANATE 875-125 MG PO TABS: 1.0000 | ORAL_TABLET | Freq: Two times a day (BID) | ORAL | 0 refills | Status: DC

## 2024-02-20 NOTE — ED Provider Notes (Addendum)
 [facility]    CSN: [id number] Arrival date & time: 02/20/24  1412      History   Chief Complaint No chief complaint on file.   HPI Alec [name] is a 68 y.o. male.   The history is provided by the patient.   Patient presents lower abdominal pain ongoing for the past 2 weeks.  Reports the pain is constant, at times there are sharp shooting pains.  The pain is a constant 5 out of 10.  States pain is located on the lower portions of his abdomen, with pain greater in the right side.  He also endorses chills last evening with worsening pain.  Last bowel movement was today.  Patient denies any known fevers, bodyaches or  nausea or vomiting, unexplained weight loss, decreased appetite, new diarrhea, new constipation, blood in the stool, gas, bloating, heartburn or indigestion, new rash, dysuria/urinary frequency, and hematuria.  Patient reports underlying history of prostate cancer, states he has seen oncology and has been treated with radiation seeds.  Reports he has taken ibuprofen and Tylenol  for the pain with some relief of symptoms, states, but the pain never goes away.  States pain worsens after he eats.  Per review of the chart, medical history significant for GERD, diverticulitis, type 2 diabetes, and he is a current smoker.  Patient has seen GI in the past.  Patient states he is scheduled for a colonoscopy next week.  Past Medical History:  Diagnosis Date   Allergic rhinitis 05/20/2009   Qualifier: Diagnosis of   By: Inocencio MD, Berwyn LABOR     IMO SNOMED Dx Update Oct 2024     Allergy    Anemia 05/06/2014   Arthritis    Back pain 05/01/2014   CAD (coronary artery disease) 09/08/2022   Cigarette smoker 03/10/2022   COLONIC POLYPS, HX OF 05/20/2009   Qualifier: Diagnosis of  By: Inocencio MD, Berwyn LABOR    Diabetes (HCC) 07/09/2014   Diabetes mellitus without complication (HCC)    type 2   Diverticulitis of colon 05/20/2009   Qualifier: Diagnosis of  By:  Inocencio MD, Berwyn LABOR    GERD    History of colonic polyps 05/20/2009   Qualifier: Diagnosis of   By: Inocencio MD, Berwyn LABOR     IMO SNOMED Dx Update Oct 2024     LEG PAIN, LEFT    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/10/2014   Malignant neoplasm of prostate (HCC) 07/08/2018   Mixed dyslipidemia 03/10/2022   Needs smoking cessation education 05/01/2014   Numbness of finger 10/10/2014   Pain in joint, forearm 05/01/2014   Pain in joint, upper arm 05/01/2014   Pain in the abdomen 05/01/2014   Prostate cancer (HCC)    Sinusitis, chronic 05/20/2009   Qualifier: Diagnosis of   By: Inocencio MD, Berwyn LABOR     IMO SNOMED Dx Update Oct 2024     SMOKER    Wellness examination 06/02/2014    Patient Active Problem List   Diagnosis Date Noted   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   CAD (coronary artery disease) 09/08/2022   Cigarette smoker 03/10/2022   Mixed dyslipidemia 03/10/2022   Allergy 03/09/2022   Arthritis 03/09/2022   Diabetes mellitus without complication (HCC) 03/09/2022   DIVERTICULITIS, HX OF 03/09/2022   Malignant neoplasm of prostate (HCC) 07/08/2018   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/10/2014   Numbness of finger 10/10/2014   Diabetes (HCC) 07/09/2014   Wellness examination 06/02/2014   Anemia  05/06/2014   Pain in joint, forearm 05/01/2014   Pain in joint, upper arm 05/01/2014   Back pain 05/01/2014   Pain in the abdomen 05/01/2014   Needs smoking cessation education 05/01/2014   SMOKER 05/20/2009   Sinusitis, chronic 05/20/2009   Allergic rhinitis 05/20/2009   GERD 05/20/2009   LEG PAIN, LEFT 05/20/2009   History of colonic polyps 05/20/2009   Diverticulitis of colon 05/20/2009    Past Surgical History:  Procedure Laterality Date   COLONOSCOPY     FINGER SURGERY     PROSTATE BIOPSY     RADIOACTIVE SEED IMPLANT N/A 09/20/2018   Procedure: RADIOACTIVE SEED IMPLANT/BRACHYTHERAPY IMPLANT;  Surgeon: Watt Rush, MD;  Location: [facility];  Service: Urology;  Laterality:  N/A;   SPACE OAR INSTILLATION N/A 09/20/2018   Procedure: SPACE OAR INSTILLATION;  Surgeon: Watt Rush, MD;  Location: [facility];  Service: Urology;  Laterality: N/A;   SPINE SURGERY     lumbar surgery 2012.   STOMACH SURGERY         Home Medications    Prior to Admission medications   Medication Sig Start Date End Date Taking? Authorizing Provider  Continuous Blood Gluc Receiver (DEXCOM G6 RECEIVER) DEVI Use as directed.  Dx code: E11.9 12/07/21   Saguier, [place], PA-C  Continuous Blood Gluc Sensor (DEXCOM G6 SENSOR) MISC Use as directed to check glucose. Change every 10 days. 12/07/21   Saguier, [place], PA-C  Continuous Blood Gluc Transmit (DEXCOM G6 TRANSMITTER) MISC Use as directed.  Dx code: E11.9 12/07/21   Saguier, [place], PA-C  glucose blood [facility] VERIO) test strip Use as instructed 08/13/14   Saguier, [place], PA-C  metFORMIN  (GLUCOPHAGE ) 500 MG tablet Take 1 tablet (500 mg total) by mouth 2 (two) times daily with a meal. 09/12/23   Wendling, Mabel Mt, DO  [facility] DELICA LANCETS 33G MISC Use as directed. 07/30/14   Saguier, Dallas, PA-C  rosuvastatin  (CRESTOR ) 20 MG tablet Take 1 tablet by mouth once daily 12/22/23   Revankar, Rajan R, MD  tamsulosin  (FLOMAX ) 0.4 MG CAPS capsule Take 1 capsule (0.4 mg total) by mouth 2 (two) times daily. 12/15/22   Matilda Senior, MD    Family History Family History  Problem Relation Age of Onset   Alzheimer's disease Mother    Heart disease Father    Parkinsonism Father    Alcohol abuse Brother    Colon cancer Neg Hx    Esophageal cancer Neg Hx    Liver cancer Neg Hx    Pancreatic cancer Neg Hx    Rectal cancer Neg Hx    Stomach cancer Neg Hx     Social History Social History   Tobacco Use   Smoking status: Every Day    Current packs/day: 0.75    Average packs/day: 0.8 packs/day for 40.0 years (30.0 ttl pk-yrs)    Types: Cigarettes   Smokeless tobacco: Never   Tobacco comments:    trying to quit, from 1  1/2 ppd to 13-14/day  Vaping Use   Vaping status: Never Used  Substance Use Topics   Alcohol use: No   Drug use: No     Allergies   Patient has no known allergies.   Review of Systems Review of Systems Per HPI  Physical Exam Triage Vital Signs ED Triage Vitals  Encounter Vitals Group     BP 02/20/24 1437 105/61     Girls Systolic BP Percentile --      Girls Diastolic  BP Percentile --      Boys Systolic BP Percentile --      Boys Diastolic BP Percentile --      Pulse Rate 02/20/24 1435 77     Resp 02/20/24 1435 20     Temp 02/20/24 1435 98.5 F (36.9 C)     Temp Source 02/20/24 1435 Oral     SpO2 --      Weight --      Height --      Head Circumference --      Peak Flow --      Pain Score 02/20/24 1437 7     Pain Loc --      Pain Education --      Exclude from Growth Chart --    No data found.  Updated Vital Signs BP 105/61   Pulse 77   Temp 98.5 F (36.9 C) (Oral)   Resp 20   Visual Acuity Right Eye Distance:   Left Eye Distance:   Bilateral Distance:    Right Eye Near:   Left Eye Near:    Bilateral Near:     Physical Exam Vitals and nursing note reviewed.  Constitutional:      General: He is not in acute distress.    Appearance: Normal appearance.  HENT:     Head: Normocephalic.  Eyes:     Extraocular Movements: Extraocular movements intact.     Conjunctiva/sclera: Conjunctivae normal.     Pupils: Pupils are equal, round, and reactive to light.  Cardiovascular:     Rate and Rhythm: Normal rate and regular rhythm.     Pulses: Normal pulses.     Heart sounds: Normal heart sounds.  Pulmonary:     Effort: Pulmonary effort is normal.     Breath sounds: Normal breath sounds.  Abdominal:     General: Bowel sounds are normal.     Palpations: Abdomen is soft.     Tenderness: There is abdominal tenderness in the right lower quadrant and left lower quadrant.  Musculoskeletal:     Cervical back: Normal range of motion.  Skin:    General: Skin  is warm and dry.  Neurological:     General: No focal deficit present.     Mental Status: He is alert and oriented to person, place, and time.  Psychiatric:        Mood and Affect: Mood normal.        Behavior: Behavior normal.      UC Treatments / Results  Labs (all labs ordered are listed, but only abnormal results are displayed) Labs Reviewed - No data to display  EKG   Radiology No results found.  Procedures Procedures (including critical care time)  Medications Ordered in UC Medications - No data to display  Initial Impression / Assessment and Plan / UC Course  I have reviewed the triage vital signs and the nursing notes.  Pertinent labs & imaging results that were available during my care of the patient were reviewed by me and considered in my medical decision making (see chart for details).  Patient with 2-week history of bilateral lower abdominal pain, he has pain that is worse in the left lower quadrant.  Patient with history significant for diverticulitis.  Will treat with Augmentin  875/125 mg tablets twice daily for the next 7 days.  Supportive care recommendations were provided and discussed with the patient to include a clear liquid diet with slowly advancing back to his regular diet, avoidance  of seeds, nuts, and high-fiber foods, and continuing over-the-counter analgesics.  Discussed strict ER follow-up precautions with the patient.  Also recommended follow-up with GI for further evaluation.  Patient was in agreement with this plan of care and verbalizes understanding.  All questions were answered.  Patient stable for discharge.   Final Clinical Impressions(s) / UC Diagnoses   Final diagnoses:  None   Discharge Instructions   None    ED Prescriptions   None    PDMP not reviewed this encounter.   Gilmer Etta PARAS, NP 02/20/24 1516    Gilmer Etta PARAS, NP 02/20/24 1527

## 2024-02-20 NOTE — ED Triage Notes (Signed)
 Pt report he has lower abdominal pain that worsens after meals x 2 weeks  Took tylenol  and motrin but no relief

## 2024-02-20 NOTE — Discharge Instructions (Addendum)
 Take medication as prescribed. Increase fluids and allow for plenty of rest. Recommend a clear liquid diet until symptoms improve. You may take Tylenol  for pain or discomfort, try to avoid ibuprofen is much as possible to help protect your kidneys and your stomach. If you develop worsening abdominal pain, with new symptoms of fever, chills, nausea, vomiting, or other concerns, please follow-up in the emergency department immediately for further evaluation. Recommend follow-up with your gastroenterologist within the next 7 to 14 days. Follow-up as needed.

## 2024-02-21 ENCOUNTER — Ambulatory Visit: Attending: Cardiovascular Disease

## 2024-02-21 DIAGNOSIS — Z01810 Encounter for preprocedural cardiovascular examination: Secondary | ICD-10-CM

## 2024-02-21 NOTE — Progress Notes (Signed)
 Virtual Visit via Telephone Note   Because of Alec [name] co-morbid illnesses, he is at least at moderate risk for complications without adequate follow up.  This format is felt to be most appropriate for this patient at this time.  Due to technical limitations with video connection (technology), today's appointment will be conducted as an audio only telehealth visit, and Alec [name] verbally agreed to proceed in this manner.   All issues noted in this document were discussed and addressed.  No physical exam could be performed with this format.  Evaluation Performed:  Preoperative cardiovascular risk assessment _____________   Date:  02/21/2024   Patient ID:  Alec [name], DOB December 29, 1955, MRN [id number] Patient Location:  Home Provider location:   Office  Primary Care Provider:  Dorina Loving, PA-C Primary Cardiologist:  None  Chief Complaint / Patient Profile  68 y.o. y/o male with a h/o CAD, hypertension, dyslipidemia, diabetes mellitus and cigarette smoking who is pending colonoscopy with [facility] and presents today for telephonic preoperative cardiovascular risk assessment. History of Present Illness  Alec [name] is a 68 y.o. male who presents via audio/video conferencing for a telehealth visit today.  Pt was last seen in cardiology clinic on 11/07/2023 by Dr. Edwyna.  At that time Alec [name] was doing well.  The patient is now pending procedure as outlined above. Since his last visit, he has remained stable from a cardiac standpoint.  Today he denies chest pain, shortness of breath, lower extremity edema, fatigue, palpitations, melena, hematuria, hemoptysis, diaphoresis, weakness, presyncope, syncope, orthopnea, and PND.  He is able to achieve greater than 4 METS of activity. Past Medical History    Past Medical History:  Diagnosis Date   Allergic rhinitis 05/20/2009   Qualifier: Diagnosis of   By: Inocencio MD, Berwyn LABOR     IMO  SNOMED Dx Update Oct 2024     Allergy    Anemia 05/06/2014   Arthritis    Back pain 05/01/2014   CAD (coronary artery disease) 09/08/2022   Cigarette smoker 03/10/2022   COLONIC POLYPS, HX OF 05/20/2009   Qualifier: Diagnosis of  By: Inocencio MD, Berwyn LABOR    Diabetes (HCC) 07/09/2014   Diabetes mellitus without complication (HCC)    type 2   Diverticulitis of colon 05/20/2009   Qualifier: Diagnosis of  By: Inocencio MD, Berwyn LABOR    GERD    History of colonic polyps 05/20/2009   Qualifier: Diagnosis of   By: Inocencio MD, Berwyn LABOR     IMO SNOMED Dx Update Oct 2024     LEG PAIN, LEFT    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/10/2014   Malignant neoplasm of prostate (HCC) 07/08/2018   Mixed dyslipidemia 03/10/2022   Needs smoking cessation education 05/01/2014   Numbness of finger 10/10/2014   Pain in joint, forearm 05/01/2014   Pain in joint, upper arm 05/01/2014   Pain in the abdomen 05/01/2014   Prostate cancer (HCC)    Sinusitis, chronic 05/20/2009   Qualifier: Diagnosis of   By: Inocencio MD, Berwyn LABOR     IMO SNOMED Dx Update Oct 2024     SMOKER    Wellness examination 06/02/2014   Past Surgical History:  Procedure Laterality Date   COLONOSCOPY     FINGER SURGERY     PROSTATE BIOPSY     RADIOACTIVE SEED IMPLANT N/A 09/20/2018   Procedure: RADIOACTIVE SEED IMPLANT/BRACHYTHERAPY IMPLANT;  Surgeon: Watt Rush, MD;  Location: [facility] [place];  Service: Urology;  Laterality: N/A;   SPACE OAR INSTILLATION N/A 09/20/2018   Procedure: SPACE OAR INSTILLATION;  Surgeon: Watt Rush, MD;  Location: [facility];  Service: Urology;  Laterality: N/A;   SPINE SURGERY     lumbar surgery 2012.   STOMACH SURGERY      Allergies  No Known Allergies  Home Medications    Prior to Admission medications   Medication Sig Start Date End Date Taking? Authorizing Provider  amoxicillin -clavulanate (AUGMENTIN ) 875-125 MG tablet Take 1 tablet by mouth every 12 (twelve) hours. 02/20/24    Leath-Warren, Etta PARAS, NP  Continuous Blood Gluc Receiver (DEXCOM G6 RECEIVER) DEVI Use as directed.  Dx code: E11.9 12/07/21   Saguier, [place], PA-C  Continuous Blood Gluc Sensor (DEXCOM G6 SENSOR) MISC Use as directed to check glucose. Change every 10 days. 12/07/21   Saguier, Dallas, PA-C  Continuous Blood Gluc Transmit (DEXCOM G6 TRANSMITTER) MISC Use as directed.  Dx code: E11.9 12/07/21   Saguier, Dallas, PA-C  glucose blood [facility] VERIO) test strip Use as instructed 08/13/14   Saguier, Dallas, PA-C  metFORMIN  (GLUCOPHAGE ) 500 MG tablet Take 1 tablet (500 mg total) by mouth 2 (two) times daily with a meal. 09/12/23   Wendling, Mabel Mt, DO  [facility] DELICA LANCETS 33G MISC Use as directed. 07/30/14   Saguier, Dallas, PA-C  rosuvastatin  (CRESTOR ) 20 MG tablet Take 1 tablet by mouth once daily 12/22/23   Revankar, Rajan R, MD  tamsulosin  (FLOMAX ) 0.4 MG CAPS capsule Take 1 capsule (0.4 mg total) by mouth 2 (two) times daily. 12/15/22   Matilda Senior, MD    Physical Exam  Vital Signs:  Alec [name] does not have vital signs available for review today. Given telephonic nature of communication, physical exam is limited. AAOx3. NAD. Normal affect.  Speech and respirations are unlabored. Accessory Clinical Findings  None Assessment & Plan    1.  Preoperative Cardiovascular Risk Assessment: Mr. Scallan's perioperative risk of a major cardiac event is 0.4% according to the Revised Cardiac Risk Index (RCRI).  Therefore, he is at low risk for perioperative complications.   His functional capacity is excellent at 8.97 METs according to the Duke Activity Status Index (DASI). Recommendations: According to ACC/AHA guidelines, no further cardiovascular testing needed.  The patient may proceed to surgery at acceptable risk.   Antiplatelet and/or Anticoagulation Recommendations: None requested    The patient was advised that if he develops new symptoms prior to surgery to contact our  office to arrange for a follow-up visit, and he verbalized understanding.  A copy of this note will be routed to requesting surgeon.  Time:   Today, I have spent 10 minutes with the patient with telehealth technology discussing medical history, symptoms, and management plan.    Emmogene Simson D Linas Stepter, NP  02/21/2024, 11:37 AM

## 2024-02-27 NOTE — Progress Notes (Unsigned)
 [facility] History and Physical   Primary Care Physician:  Dorina Loving, PA-C   Reason for Procedure:  History of adenomatous colon polyps  Plan:    Surveillance colonoscopy     HPI: Alec [name] is a 68 y.o. male undergoing surveillance colonoscopy for history of adenomatous colon polyps.  Patient has undergone colonoscopies in 2011 and 2019 both of which have demonstrated 2 tubular adenomas.  No family history of colorectal cancer or polyps.  Patient denies current symptoms of change in bowel habits or rectal bleeding.   Past Medical History:  Diagnosis Date   Allergic rhinitis 05/20/2009   Qualifier: Diagnosis of   By: Inocencio MD, [place] LABOR     IMO SNOMED Dx Update Oct 2024     Allergy    Anemia 05/06/2014   Arthritis    Back pain 05/01/2014   CAD (coronary artery disease) 09/08/2022   Cigarette smoker 03/10/2022   COLONIC POLYPS, HX OF 05/20/2009   Qualifier: Diagnosis of  By: Inocencio MD, [place] LABOR    Diabetes (HCC) 07/09/2014   Diabetes mellitus without complication (HCC)    type 2   Diverticulitis of colon 05/20/2009   Qualifier: Diagnosis of  By: Inocencio MD, [place] LABOR    GERD    History of colonic polyps 05/20/2009   Qualifier: Diagnosis of   By: Inocencio MD, [place] LABOR     IMO SNOMED Dx Update Oct 2024     LEG PAIN, LEFT    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/10/2014   Malignant neoplasm of prostate (HCC) 07/08/2018   Mixed dyslipidemia 03/10/2022   Needs smoking cessation education 05/01/2014   Numbness of finger 10/10/2014   Pain in joint, forearm 05/01/2014   Pain in joint, upper arm 05/01/2014   Pain in the abdomen 05/01/2014   Prostate cancer (HCC)    Sinusitis, chronic 05/20/2009   Qualifier: Diagnosis of   By: Inocencio MD, [place] LABOR     IMO SNOMED Dx Update Oct 2024     SMOKER    Wellness examination 06/02/2014    Past Surgical History:  Procedure Laterality Date   COLONOSCOPY     FINGER SURGERY     PROSTATE BIOPSY     RADIOACTIVE SEED  IMPLANT N/A 09/20/2018   Procedure: RADIOACTIVE SEED IMPLANT/BRACHYTHERAPY IMPLANT;  Surgeon: Watt Rush, MD;  Location: [facility];  Service: Urology;  Laterality: N/A;   SPACE OAR INSTILLATION N/A 09/20/2018   Procedure: SPACE OAR INSTILLATION;  Surgeon: Watt Rush, MD;  Location: [facility];  Service: Urology;  Laterality: N/A;   SPINE SURGERY     lumbar surgery 2012.   STOMACH SURGERY      Prior to Admission medications   Medication Sig Start Date End Date Taking? Authorizing Provider  amoxicillin -clavulanate (AUGMENTIN ) 875-125 MG tablet Take 1 tablet by mouth every 12 (twelve) hours. 02/20/24   Leath-Warren, Etta PARAS, NP  Continuous Blood Gluc Receiver (DEXCOM G6 RECEIVER) DEVI Use as directed.  Dx code: E11.9 12/07/21   Saguier, Loving, PA-C  Continuous Blood Gluc Sensor (DEXCOM G6 SENSOR) MISC Use as directed to check glucose. Change every 10 days. 12/07/21   Saguier, Loving, PA-C  Continuous Blood Gluc Transmit (DEXCOM G6 TRANSMITTER) MISC Use as directed.  Dx code: E11.9 12/07/21   Saguier, Loving, PA-C  glucose blood [facility] VERIO) test strip Use as instructed 08/13/14   Saguier, Loving, PA-C  metFORMIN  (GLUCOPHAGE ) 500 MG tablet Take 1 tablet (500 mg total) by mouth 2 (two) times  daily with a meal. 09/12/23   Wendling, Mabel Mt, DO  [facility] DELICA LANCETS 33G MISC Use as directed. 07/30/14   Saguier, [place], PA-C  rosuvastatin  (CRESTOR ) 20 MG tablet Take 1 tablet by mouth once daily 12/22/23   Revankar, Rajan R, MD  tamsulosin  (FLOMAX ) 0.4 MG CAPS capsule Take 1 capsule (0.4 mg total) by mouth 2 (two) times daily. 12/15/22   Matilda Senior, MD    Current Outpatient Medications  Medication Sig Dispense Refill   amoxicillin -clavulanate (AUGMENTIN ) 875-125 MG tablet Take 1 tablet by mouth every 12 (twelve) hours. 14 tablet 0   Continuous Blood Gluc Receiver (DEXCOM G6 RECEIVER) DEVI Use as directed.  Dx code: E11.9 1 each 0   Continuous Blood  Gluc Sensor (DEXCOM G6 SENSOR) MISC Use as directed to check glucose. Change every 10 days. 3 each 0   Continuous Blood Gluc Transmit (DEXCOM G6 TRANSMITTER) MISC Use as directed.  Dx code: E11.9 1 each 0   glucose blood (ONETOUCH VERIO) test strip Use as instructed 100 each 12   metFORMIN  (GLUCOPHAGE ) 500 MG tablet Take 1 tablet (500 mg total) by mouth 2 (two) times daily with a meal. 180 tablet 0   ONETOUCH DELICA LANCETS 33G MISC Use as directed. 100 each 12   rosuvastatin  (CRESTOR ) 20 MG tablet Take 1 tablet by mouth once daily 90 tablet 2   tamsulosin  (FLOMAX ) 0.4 MG CAPS capsule Take 1 capsule (0.4 mg total) by mouth 2 (two) times daily. 60 capsule 11   Current Facility-Administered Medications  Medication Dose Route Frequency Provider Last Rate Last Admin   hydrocortisone -pramoxine (PROCTOFOAM -HC) rectal foam 1 applicator  1 applicator Rectal BID Saguier, Edward, PA-C        Allergies as of 02/28/2024   (No Known Allergies)    Family History  Problem Relation Age of Onset   Alzheimer's disease Mother    Heart disease Father    Parkinsonism Father    Alcohol abuse Brother    Colon cancer Neg Hx    Esophageal cancer Neg Hx    Liver cancer Neg Hx    Pancreatic cancer Neg Hx    Rectal cancer Neg Hx    Stomach cancer Neg Hx     Social History   Socioeconomic History   Marital status: Married    Spouse name: Not on file   Number of children: Not on file   Years of education: Not on file   Highest education level: 10th grade  Occupational History   Occupation: Merchandiser, retail   Tobacco Use   Smoking status: Every Day    Current packs/day: 0.75    Average packs/day: 0.8 packs/day for 40.0 years (30.0 ttl pk-yrs)    Types: Cigarettes   Smokeless tobacco: Never   Tobacco comments:    trying to quit, from 1 1/2 ppd to 13-14/day  Vaping Use   Vaping status: Never Used  Substance and Sexual Activity   Alcohol use: No   Drug use: No   Sexual activity: Yes  Other Topics  Concern   Not on file  Social History Narrative   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   Married, lives with wife (MM dx 2009)   Social Drivers of Health   Financial Resource Strain: Low Risk  (11/30/2023)   Overall Financial Resource Strain (CARDIA)    Difficulty of Paying Living Expenses: Not very hard  Food Insecurity: No Food Insecurity (11/30/2023)   Hunger Vital Sign    Worried About Running Out of Food  in the Last Year: Never true    Ran Out of Food in the Last Year: Never true  Transportation Needs: No Transportation Needs (11/30/2023)   PRAPARE - Administrator, Civil Service (Medical): No    Lack of Transportation (Non-Medical): No  Physical Activity: Insufficiently Active (11/30/2023)   Exercise Vital Sign    Days of Exercise per Week: 1 day    Minutes of Exercise per Session: 130 min  Stress: No Stress Concern Present (11/30/2023)   [employer] of Occupational Health - Occupational Stress Questionnaire    Feeling of Stress : Only a little  Social Connections: Moderately Integrated (11/30/2023)   Social Connection and Isolation Panel    Frequency of Communication with Friends and Family: More than three times a week    Frequency of Social Gatherings with Friends and Family: Once a week    Attends Religious Services: More than 4 times per year    Active Member of [employer] or Organizations: No    Attends Engineer, structural: Not on file    Marital Status: Married  Catering manager Violence: Not on file    Review of Systems:  All other review of systems negative except as mentioned in the HPI.  Physical Exam: Vital signs There were no vitals taken for this visit.  General:   Alert,  Well-developed, well-nourished, pleasant and cooperative in NAD Airway:  Mallampati  Lungs:  Clear throughout to auscultation.   Heart:  Regular rate and rhythm; no murmurs, clicks, rubs,  or gallops. Abdomen:  Soft, nontender and nondistended. Normal  bowel sounds.   Neuro/Psych:  Normal mood and affect. A and O x 3  Inocente Hausen, MD [facility]

## 2024-02-28 ENCOUNTER — Encounter: Payer: Self-pay | Admitting: Pediatrics

## 2024-02-28 ENCOUNTER — Ambulatory Visit: Admitting: Pediatrics

## 2024-02-28 VITALS — BP 123/97 | HR 67 | Temp 98.1°F | Resp 12 | Ht 70.0 in | Wt 190.0 lb

## 2024-02-28 DIAGNOSIS — K5730 Diverticulosis of large intestine without perforation or abscess without bleeding: Secondary | ICD-10-CM

## 2024-02-28 DIAGNOSIS — D125 Benign neoplasm of sigmoid colon: Secondary | ICD-10-CM

## 2024-02-28 DIAGNOSIS — Z1211 Encounter for screening for malignant neoplasm of colon: Secondary | ICD-10-CM | POA: Diagnosis not present

## 2024-02-28 DIAGNOSIS — D120 Benign neoplasm of cecum: Secondary | ICD-10-CM | POA: Diagnosis not present

## 2024-02-28 DIAGNOSIS — Z860101 Personal history of adenomatous and serrated colon polyps: Secondary | ICD-10-CM

## 2024-02-28 DIAGNOSIS — D122 Benign neoplasm of ascending colon: Secondary | ICD-10-CM

## 2024-02-28 DIAGNOSIS — D123 Benign neoplasm of transverse colon: Secondary | ICD-10-CM | POA: Diagnosis not present

## 2024-02-28 DIAGNOSIS — Z860100 Personal history of colon polyps, unspecified: Secondary | ICD-10-CM

## 2024-02-28 MED ORDER — SODIUM CHLORIDE 0.9 % IV SOLN: 500.0000 mL | Freq: Once | INTRAVENOUS | Status: DC

## 2024-02-28 NOTE — Progress Notes (Signed)
 Called to room to assist during endoscopic procedure.  Patient ID and intended procedure confirmed with present staff. Received instructions for my participation in the procedure from the performing physician.

## 2024-02-28 NOTE — Op Note (Signed)
 [facility] Patient Name: Alec [name] Procedure Date: 02/28/2024 8:46 AM MRN: [id number] Endoscopist: Inocente Hausen , MD, [id number] Age: 68 Referring MD:  Date of Birth: 12-Sep-1955 Gender: Male Account #: [id number] Procedure:                Colonoscopy Indications:              High risk colon cancer surveillance: Personal                            history of non-advanced adenoma, Last colonoscopy:                            December 2019 Medicines:                Monitored Anesthesia Care Procedure:                Pre-Anesthesia Assessment:                           - Prior to the procedure, a History and Physical                            was performed, and patient medications and                            allergies were reviewed. [name] tolerance of                            previous anesthesia was also reviewed. The risks                            and benefits of the procedure and the sedation                            options and risks were discussed with the patient.                            All questions were answered, and informed consent                            was obtained. Prior Anticoagulants: The patient has                            taken no anticoagulant or antiplatelet agents. ASA                            Grade Assessment: III - A patient with severe                            systemic disease. After reviewing the risks and                            benefits, the patient was deemed in satisfactory  condition to undergo the procedure.                           After obtaining informed consent, the colonoscope                            was passed under direct vision. Throughout the                            procedure, the patient's blood pressure, pulse, and                            oxygen saturations were monitored continuously. The                            Olympus Scope SN: [id number] was introduced  through                            the anus and advanced to the cecum, identified by                            appendiceal orifice and ileocecal valve. The                            colonoscopy was somewhat difficult due to                            restricted mobility of the colon and a tortuous                            colon. Successful completion of the procedure was                            aided by straightening and shortening the scope to                            obtain bowel loop reduction and using scope                            torsion. The patient tolerated the procedure well.                            The quality of the bowel preparation was adequate                            to identify polyps greater than 5 mm in size. The                            ileocecal valve, appendiceal orifice, and rectum                            were photographed. Scope In: 8:59:33 AM Scope Out: 9:21:37 AM Scope Withdrawal Time: 0 hours 14 minutes 32 seconds  Total  Procedure Duration: 0 hours 22 minutes 4 seconds  Findings:                 The perianal and digital rectal examinations were                            normal. Pertinent negatives include normal                            sphincter tone and no palpable rectal lesions.                           Multiple large-mouthed and small-mouthed                            diverticula were found in the sigmoid colon,                            descending colon and transverse colon.                           Three sessile polyps were found in the sigmoid                            colon, transverse colon and cecum. The polyps were                            5 to 6 mm in size. These polyps were removed with a                            cold snare. Resection and retrieval were complete.                           Two sessile polyps were found in the transverse                            colon and ascending colon. The polyps were 3 to 4                             mm in size. These polyps were removed with a cold                            biopsy forceps. Resection and retrieval were                            complete.                           The retroflexed view of the distal rectum and anal                            verge was normal and showed no anal or rectal  abnormalities. Complications:            No immediate complications. Estimated blood loss:                            Minimal. Estimated Blood Loss:     Estimated blood loss was minimal. Impression:               - Diverticulosis in the sigmoid colon, in the                            descending colon and in the transverse colon.                           - Three 5 to 6 mm polyps in the sigmoid colon, in                            the transverse colon and in the cecum, removed with                            a cold snare. Resected and retrieved.                           - Two 3 to 4 mm polyps in the transverse colon and                            in the ascending colon, removed with a cold biopsy                            forceps. Resected and retrieved.                           - Tortuous colon with restricted mobility possibly                            related to prior history of diverticuli and pelvic                            radiation for prostate cancer Recommendation:           - Discharge patient to home (ambulatory).                           - Await pathology results.                           - Repeat colonoscopy for surveillance based on                            pathology results. Consider using pediatric                            colonoscope for future procedures due to limited  mobility of the sigmoid colon.                           - The findings and recommendations were discussed                            with the patient's family.                           - Return to referring  physician.                           - Patient has a contact number available for                            emergencies. The signs and symptoms of potential                            delayed complications were discussed with the                            patient. Return to normal activities tomorrow.                            Written discharge instructions were provided to the                            patient. Inocente Hausen, MD 02/28/2024 9:27:44 AM This report has been signed electronically.

## 2024-02-28 NOTE — Patient Instructions (Signed)
 YOU HAD AN ENDOSCOPIC PROCEDURE TODAY AT THE [facility]:   Refer to the procedure report that was given to you for any specific questions about what was found during the examination.  If the procedure report does not answer your questions, please call your gastroenterologist to clarify.  If you requested that your care partner not be given the details of your procedure findings, then the procedure report has been included in a sealed envelope for you to review at your convenience later.  YOU SHOULD EXPECT: Some feelings of bloating in the abdomen. Passage of more gas than usual.  Walking can help get rid of the air that was put into your GI tract during the procedure and reduce the bloating. If you had a lower endoscopy (such as a colonoscopy or flexible sigmoidoscopy) you may notice spotting of blood in your stool or on the toilet paper. If you underwent a bowel prep for your procedure, you may not have a normal bowel movement for a few days.  Please Note:  You might notice some irritation and congestion in your nose or some drainage.  This is from the oxygen used during your procedure.  There is no need for concern and it should clear up in a day or so.  SYMPTOMS TO REPORT IMMEDIATELY:  Following lower endoscopy (colonoscopy or flexible sigmoidoscopy):  Excessive amounts of blood in the stool  Significant tenderness or worsening of abdominal pains  Swelling of the abdomen that is new, acute  Fever of 100F or higher   Await pathology results Handouts on polyps and diverticulosis given Return to referring physician Resume previous diet  For urgent or emergent issues, a gastroenterologist can be reached at any hour by calling [phone]. Do not use MyChart messaging for urgent concerns.    DIET:  We do recommend a small meal at first, but then you may proceed to your regular diet.  Drink plenty of fluids but you should avoid alcoholic beverages for 24 hours.  ACTIVITY:   You should plan to take it easy for the rest of today and you should NOT DRIVE or use heavy machinery until tomorrow (because of the sedation medicines used during the test).    FOLLOW UP: Our staff will call the number listed on your records the next business day following your procedure.  We will call around 7:15- 8:00 am to check on you and address any questions or concerns that you may have regarding the information given to you following your procedure. If we do not reach you, we will leave a message.     If any biopsies were taken you will be contacted by phone or by letter within the next 1-3 weeks.  Please call us  at [phone] if you have not heard about the biopsies in 3 weeks.    SIGNATURES/CONFIDENTIALITY: You and/or your care partner have signed paperwork which will be entered into your electronic medical record.  These signatures attest to the fact that that the information above on your After Visit Summary has been reviewed and is understood.  Full responsibility of the confidentiality of this discharge information lies with you and/or your care-partner.

## 2024-02-28 NOTE — Progress Notes (Signed)
 Pt A/O x 3, gd SR's, pleased with anesthesia, report to RN

## 2024-02-28 NOTE — Progress Notes (Signed)
 Pt's states no medical or surgical changes since previsit or office visit.

## 2024-02-29 ENCOUNTER — Telehealth: Payer: Self-pay | Admitting: *Deleted

## 2024-02-29 NOTE — Telephone Encounter (Signed)
 No answer after post call. Left a message.

## 2024-03-02 ENCOUNTER — Other Ambulatory Visit (HOSPITAL_COMMUNITY): Payer: Self-pay

## 2024-03-02 ENCOUNTER — Other Ambulatory Visit: Payer: Self-pay | Admitting: Urology

## 2024-03-02 DIAGNOSIS — N401 Enlarged prostate with lower urinary tract symptoms: Secondary | ICD-10-CM

## 2024-03-04 ENCOUNTER — Other Ambulatory Visit: Payer: Self-pay

## 2024-03-04 LAB — SURGICAL PATHOLOGY

## 2024-03-05 ENCOUNTER — Other Ambulatory Visit: Payer: Self-pay

## 2024-03-05 ENCOUNTER — Other Ambulatory Visit (HOSPITAL_COMMUNITY): Payer: Self-pay

## 2024-03-05 MED ORDER — TAMSULOSIN HCL 0.4 MG PO CAPS
0.4000 mg | ORAL_CAPSULE | Freq: Two times a day (BID) | ORAL | 11 refills | Status: AC
  Filled 2024-03-05: qty 60, 30d supply, fill #0
  Filled 2024-05-23: qty 60, 30d supply, fill #1

## 2024-03-10 ENCOUNTER — Ambulatory Visit: Payer: Self-pay | Admitting: Pediatrics

## 2024-03-20 ENCOUNTER — Ambulatory Visit (INDEPENDENT_AMBULATORY_CARE_PROVIDER_SITE_OTHER): Admitting: *Deleted

## 2024-03-20 VITALS — Ht 70.0 in | Wt 179.0 lb

## 2024-03-20 DIAGNOSIS — Z1159 Encounter for screening for other viral diseases: Secondary | ICD-10-CM | POA: Diagnosis not present

## 2024-03-20 DIAGNOSIS — Z0000 Encounter for general adult medical examination without abnormal findings: Secondary | ICD-10-CM | POA: Diagnosis not present

## 2024-03-20 NOTE — Patient Instructions (Signed)
 Alec [name] , Thank you for taking time out of your busy schedule to complete your Annual Wellness Visit with me. I enjoyed our conversation and look forward to speaking with you again next year. I, as well as your care team,  appreciate your ongoing commitment to your health goals. Please review the following plan we discussed and let me know if I can assist you in the future. Your Game plan/ To Do List      Follow up Visits: Next Medicare AWV with our clinical staff:  03/25/25 9am, telephone.   Next Office Visit with your provider: [place] will determine after labs are reviewed.  Lab appt:  03/21/24 9:15am  Clinician Recommendations:  Aim for 30 minutes of exercise or brisk walking, 6-8 glasses of water , and 5 servings of fruits and vegetables each day.       This is a list of the screening recommended for you and due dates:  Health Maintenance  Topic Date Due   Medicare Annual Wellness Visit  Never done   Hepatitis C Screening  Never done   Flu Shot  02/09/2024   COVID-19 Vaccine (1) 07/10/2024*   Zoster (Shingles) Vaccine (1 of 2) 11/29/2024*   Pneumococcal Vaccine for age over 49 (1 of 2 - PCV) 11/30/2024*   Hemoglobin A1C  05/12/2024   DTaP/Tdap/Td vaccine (3 - Td or Tdap) 06/02/2024   Yearly kidney function blood test for diabetes  11/09/2024   Yearly kidney health urinalysis for diabetes  11/30/2024   Complete foot exam   11/30/2024   Eye exam for diabetics  12/04/2024   Screening for Lung Cancer  12/17/2024   Colon Cancer Screening  02/28/2027   HPV Vaccine  Aged Out   Meningitis B Vaccine  Aged Out  *Topic was postponed. The date shown is not the original due date.   I have mailed you a set of Advanced directives. Please let me know if you don't receive your packet within one week. Once completed and notarized, you may return a copy of your Advanced Directive(s) by either of the following:  Advanced directives: (Copy Requested) Please bring a copy of your health care  power of attorney and living will to the office to be added to your chart at your convenience. You can mail to [facility] [address] [place], [place] [address] or email to [email or web address] Advance Care Planning is important because it:  [x]  Makes sure you receive the medical care that is consistent with your values, goals, and preferences  [x]  It provides guidance to your family and loved ones and reduces their decisional burden about whether or not they are making the right decisions based on your wishes.  Follow the link provided in your after visit summary or read over the paperwork we have mailed to you to help you started getting your Advance Directives in place. If you need assistance in completing these, please reach out to us  so that we can help you!  See attachments for Preventive Care and Fall Prevention Tips.

## 2024-03-20 NOTE — Progress Notes (Signed)
 Subjective:   Alec [name] is a 68 y.o. who presents for a Medicare Wellness preventive visit.  As a reminder, Annual Wellness Visits don't include a physical exam, and some assessments may be limited, especially if this visit is performed virtually. We may recommend an in-person follow-up visit with your provider if needed.  Visit Complete: Virtual I connected with  Lamar Jama Rudder on 03/20/24 by a audio enabled telemedicine application and verified that I am speaking with the correct person using two identifiers.  Patient Location: Home  Provider Location: Office/Clinic  I discussed the limitations of evaluation and management by telemedicine. The patient expressed understanding and agreed to proceed.  Vital Signs: Because this visit was a virtual/telehealth visit, some criteria may be missing or patient reported. Any vitals not documented were not able to be obtained and vitals that have been documented are patient reported.  VideoDeclined- This patient declined Librarian, academic. Therefore the visit was completed with audio only.  Persons Participating in Visit: Patient.  AWV Questionnaire: Yes: Patient Medicare AWV questionnaire was completed by the patient on 03/18/24; I have confirmed that all information answered by patient is correct and no changes since this date.  Cardiac Risk Factors include: advanced age (>86men, >11 women);male gender;dyslipidemia;diabetes mellitus;smoking/ tobacco exposure;Other (see comment), Risk factor comments: CAD, COPD     Objective:    Today's Vitals   03/20/24 0902  Weight: 179 lb (81.2 kg)  Height: 5' 10 (1.778 m)   Body mass index is 25.68 kg/m.     03/20/2024    9:12 AM 09/20/2018    6:45 AM  Advanced Directives  Does Patient Have a Medical Advance Directive? No No   Would patient like information on creating a medical advance directive? Yes (MAU/Ambulatory/Procedural Areas - Information  given) No - Patient declined      Data saved with a previous flowsheet row definition    Current Medications (verified) Outpatient Encounter Medications as of 03/20/2024  Medication Sig   Continuous Blood Gluc Receiver (DEXCOM G6 RECEIVER) DEVI Use as directed.  Dx code: E11.9   Continuous Blood Gluc Sensor (DEXCOM G6 SENSOR) MISC Use as directed to check glucose. Change every 10 days.   Continuous Blood Gluc Transmit (DEXCOM G6 TRANSMITTER) MISC Use as directed.  Dx code: E11.9   glucose blood (ONETOUCH VERIO) test strip Use as instructed   metFORMIN  (GLUCOPHAGE ) 500 MG tablet Take 1 tablet (500 mg total) by mouth 2 (two) times daily with a meal.   ONETOUCH DELICA LANCETS 33G MISC Use as directed.   rosuvastatin  (CRESTOR ) 20 MG tablet Take 1 tablet by mouth once daily   tamsulosin  (FLOMAX ) 0.4 MG CAPS capsule Take 1 capsule (0.4 mg total) by mouth 2 (two) times daily.   [DISCONTINUED] amoxicillin -clavulanate (AUGMENTIN ) 875-125 MG tablet Take 1 tablet by mouth every 12 (twelve) hours. (Patient not taking: Reported on 02/28/2024)   Facility-Administered Encounter Medications as of 03/20/2024  Medication   hydrocortisone -pramoxine (PROCTOFOAM -HC) rectal foam 1 applicator    Allergies (verified) Patient has no known allergies.   History: Past Medical History:  Diagnosis Date   Allergic rhinitis 05/20/2009   Qualifier: Diagnosis of   By: Inocencio MD, [place] LABOR     IMO SNOMED Dx Update Oct 2024     Allergy    Anemia 05/06/2014   Arthritis    Back pain 05/01/2014   CAD (coronary artery disease) 09/08/2022   Cigarette smoker 03/10/2022   COLONIC POLYPS, HX OF 05/20/2009  Qualifier: Diagnosis of  By: Inocencio MD, [place] LABOR    Diabetes (HCC) 07/09/2014   Diabetes mellitus without complication (HCC)    type 2   Diverticulitis of colon 05/20/2009   Qualifier: Diagnosis of  By: Inocencio MD, [place] LABOR    GERD    History of colonic polyps 05/20/2009   Qualifier: Diagnosis of   By:  Inocencio MD, [place] LABOR     IMO SNOMED Dx Update Oct 2024     LEG PAIN, LEFT    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/10/2014   Malignant neoplasm of prostate (HCC) 07/08/2018   Mixed dyslipidemia 03/10/2022   Needs smoking cessation education 05/01/2014   Numbness of finger 10/10/2014   Pain in joint, forearm 05/01/2014   Pain in joint, upper arm 05/01/2014   Pain in the abdomen 05/01/2014   Prostate cancer (HCC)    Sinusitis, chronic 05/20/2009   Qualifier: Diagnosis of   By: Inocencio MD, [place] LABOR     IMO SNOMED Dx Update Oct 2024     SMOKER    Wellness examination 06/02/2014   Past Surgical History:  Procedure Laterality Date   COLONOSCOPY     FINGER SURGERY     PROSTATE BIOPSY     RADIOACTIVE SEED IMPLANT N/A 09/20/2018   Procedure: RADIOACTIVE SEED IMPLANT/BRACHYTHERAPY IMPLANT;  Surgeon: Watt Rush, MD;  Location: [facility];  Service: Urology;  Laterality: N/A;   SPACE OAR INSTILLATION N/A 09/20/2018   Procedure: SPACE OAR INSTILLATION;  Surgeon: Watt Rush, MD;  Location: [facility];  Service: Urology;  Laterality: N/A;   SPINE SURGERY     lumbar surgery 2012.   STOMACH SURGERY     Family History  Problem Relation Age of Onset   Alzheimer's disease Mother    Heart disease Father    Parkinsonism Father    Alcohol abuse Brother    Colon cancer Neg Hx    Esophageal cancer Neg Hx    Liver cancer Neg Hx    Pancreatic cancer Neg Hx    Rectal cancer Neg Hx    Stomach cancer Neg Hx    Social History   Socioeconomic History   Marital status: Married    Spouse name: Not on file   Number of children: Not on file   Years of education: Not on file   Highest education level: 10th grade  Occupational History   Occupation: Merchandiser, retail   Tobacco Use   Smoking status: Every Day    Current packs/day: 0.75    Average packs/day: 0.8 packs/day for 40.0 years (30.0 ttl pk-yrs)    Types: Cigarettes   Smokeless tobacco: Never   Tobacco comments:    trying to  quit, from 1 1/2 ppd to 13-14/day  Vaping Use   Vaping status: Never Used  Substance and Sexual Activity   Alcohol use: No   Drug use: No   Sexual activity: Yes  Other Topics Concern   Not on file  Social History Narrative   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   Married, lives with wife (MM dx 2009)   Social Drivers of Corporate investment banker Strain: Low Risk  (03/20/2024)   Overall Financial Resource Strain (CARDIA)    Difficulty of Paying Living Expenses: Not very hard  Food Insecurity: No Food Insecurity (03/18/2024)   Hunger Vital Sign    Worried About Running Out of Food in the Last Year: Never true    Ran Out of Food in the Last Year:  Never true  Transportation Needs: No Transportation Needs (03/18/2024)   PRAPARE - Administrator, Civil Service (Medical): No    Lack of Transportation (Non-Medical): No  Physical Activity: Sufficiently Active (03/18/2024)   Exercise Vital Sign    Days of Exercise per Week: 4 days    Minutes of Exercise per Session: 60 min  Stress: No Stress Concern Present (03/18/2024)   [employer] of Occupational Health - Occupational Stress Questionnaire    Feeling of Stress: Not at all  Social Connections: Moderately Integrated (03/18/2024)   Social Connection and Isolation Panel    Frequency of Communication with Friends and Family: Once a week    Frequency of Social Gatherings with Friends and Family: Once a week    Attends Religious Services: More than 4 times per year    Active Member of [employer] or Organizations: Yes    Attends Banker Meetings: 1 to 4 times per year    Marital Status: Married    Tobacco Counseling Ready to quit: Not Answered Counseling given: Not Answered Tobacco comments: trying to quit, from 1 1/2 ppd to 13-14/day    Clinical Intake:  Pre-visit preparation completed: Yes  Pain : No/denies pain     BMI - recorded: 25.68 Nutritional Status: BMI 25 -29 Overweight Nutritional Risks:  None Diabetes: Yes CBG done?: No Did pt. bring in CBG monitor from home?: No  Lab Results  Component Value Date   HGBA1C 7.6 (H) 11/10/2023   HGBA1C 6.5 12/13/2022   HGBA1C 7.2 (H) 09/08/2022     How often do you need to have someone help you when you read instructions, pamphlets, or other written materials from your doctor or pharmacy?: 1 - Never What is the last grade level you completed in school?: 10th grade  Interpreter Needed?: No  Information entered by :: Lolita Libra, CMA(AAMA)   Activities of Daily Living     03/18/2024    2:09 PM  In your present state of health, do you have any difficulty performing the following activities:  Hearing? 0  Vision? 0  Difficulty concentrating or making decisions? 0  Walking or climbing stairs? 0  Dressing or bathing? 0  Doing errands, shopping? 0  Preparing Food and eating ? N  Using the Toilet? N  In the past six months, have you accidently leaked urine? Y  Comment Sees urology  Do you have problems with loss of bowel control? Y  Comment Has intermittent  diarrhea/constipation. Has discussed with GI  Managing your Medications? N  Managing your Finances? N  Housekeeping or managing your Housekeeping? N    Patient Care Team: Saguier, Edward, PA-C as PCP - General (Physician Assistant) Teressa Toribio SQUIBB, MD (Inactive) (Gastroenterology) Carles Gear, MD (Neurosurgery) [place], Gaylord, PA-C (Inactive) (Dermatology) Camillo Golas, MD as Attending Physician (Ophthalmology) Darlean Ozell NOVAK, MD as Consulting Physician (Pulmonary Disease)  I have updated your Care Teams any recent Medical Services you may have received from other providers in the past year.     Assessment:   This is a routine wellness examination for Ildefonso.  Hearing/Vision screen Hearing Screening - Comments:: Denies hearing difficulties.  Vision Screening - Comments:: Up to date with routine eye exams with [facility]    Goals Addressed    None    Depression Screen     03/20/2024    9:10 AM 01/13/2021    8:17 AM 10/28/2016    9:31 AM 06/02/2014   11:18 AM  PHQ 2/9  Scores  PHQ - 2 Score 0 0 0 0  PHQ- 9 Score 0  1     Fall Risk     03/18/2024    2:09 PM 12/13/2022    8:16 AM 06/02/2014   11:18 AM  Fall Risk   Falls in the past year? 0 0 No   Number falls in past yr: 0 0   Injury with Fall? 0 0   Risk for fall due to : No Fall Risks No Fall Risks   Follow up Education provided Education provided;Falls evaluation completed      Data saved with a previous flowsheet row definition    MEDICARE RISK AT HOME:  Medicare Risk at Home Any stairs in or around the home?: (Patient-Rptd) Yes If so, are there any without handrails?: (Patient-Rptd) No Home free of loose throw rugs in walkways, pet beds, electrical cords, etc?: (Patient-Rptd) Yes Adequate lighting in your home to reduce risk of falls?: (Patient-Rptd) Yes Life alert?: (Patient-Rptd) No Use of a cane, walker or w/c?: (Patient-Rptd) No Grab bars in the bathroom?: (Patient-Rptd) Yes Shower chair or bench in shower?: (Patient-Rptd) No Elevated toilet seat or a handicapped toilet?: (Patient-Rptd) Yes  TIMED UP AND GO:  Was the test performed?  No,audio  Cognitive Function: 6CIT completed        03/20/2024    9:12 AM  6CIT Screen  What Year? 0 points  What month? 0 points  What time? 0 points  Count back from 20 0 points  Months in reverse 2 points  Repeat phrase 0 points  Total Score 2 points    Immunizations Immunization History  Administered Date(s) Administered   Influenza-Unspecified 10/06/2017   Td 07/11/2001   Tdap 06/02/2014    Screening Tests Health Maintenance  Topic Date Due   Hepatitis C Screening  Never done   COVID-19 Vaccine (1) 07/10/2024 (Originally 05/22/1961)   Influenza Vaccine  10/08/2024 (Originally 02/09/2024)   Zoster Vaccines- Shingrix (1 of 2) 11/29/2024 (Originally 05/23/1975)   Pneumococcal Vaccine: 50+ Years (1 of  2 - PCV) 11/30/2024 (Originally 05/23/1975)   HEMOGLOBIN A1C  05/12/2024   DTaP/Tdap/Td (3 - Td or Tdap) 06/02/2024   Diabetic kidney evaluation - eGFR measurement  11/09/2024   Diabetic kidney evaluation - Urine ACR  11/30/2024   FOOT EXAM  11/30/2024   OPHTHALMOLOGY EXAM  12/04/2024   Lung Cancer Screening  12/17/2024   Medicare Annual Wellness (AWV)  03/20/2025   Colonoscopy  02/28/2027   HPV VACCINES  Aged Out   Meningococcal B Vaccine  Aged Out    Health Maintenance Items Addressed: Declines flu vaccine. Hep C screen ordered.  Additional Screening:  Vision Screening: Recommended annual ophthalmology exams for early detection of glaucoma and other disorders of the eye. Is the patient up to date with their annual eye exam?  Yes  Who is the provider or what is the name of the office in which the patient attends annual eye exams? [facility]  Dental Screening: Recommended annual dental exams for proper oral hygiene  Community Resource Referral / Chronic Care Management: CRR required this visit?  No   CCM required this visit?  No   Plan:    I have personally reviewed and noted the following in the patient's chart:   Medical and social history Use of alcohol, tobacco or illicit drugs  Current medications and supplements including opioid prescriptions. Patient is not currently taking opioid prescriptions. Functional ability and status Nutritional status Physical activity Advanced directives  List of other physicians Hospitalizations, surgeries, and ER visits in previous 12 months Vitals Screenings to include cognitive, depression, and falls Referrals and appointments  In addition, I have reviewed and discussed with patient certain preventive protocols, quality metrics, and best practice recommendations. A written personalized care plan for preventive services as well as general preventive health recommendations were provided to patient.   Lolita Libra,  CMA   03/20/2024   After Visit Summary: (MyChart) Due to this being a telephonic visit, the after visit summary with patients personalized plan was offered to patient via MyChart   Notes: Nothing significant to report at this time.

## 2024-03-21 ENCOUNTER — Other Ambulatory Visit

## 2024-04-16 ENCOUNTER — Telehealth: Payer: Self-pay | Admitting: Pediatrics

## 2024-04-16 NOTE — Telephone Encounter (Signed)
 Inbound call from patient stating that he got a letter in the mail from insurance that they were not going to cover his procedure from 8/20. Requesting a call back to discuss. Please advise.

## 2024-05-23 ENCOUNTER — Other Ambulatory Visit (HOSPITAL_COMMUNITY): Payer: Self-pay

## 2024-05-23 ENCOUNTER — Other Ambulatory Visit: Payer: Self-pay | Admitting: Family Medicine

## 2024-05-24 ENCOUNTER — Other Ambulatory Visit (HOSPITAL_COMMUNITY): Payer: Self-pay

## 2024-05-24 ENCOUNTER — Other Ambulatory Visit: Payer: Self-pay

## 2024-05-24 MED ORDER — METFORMIN HCL 500 MG PO TABS
500.0000 mg | ORAL_TABLET | Freq: Two times a day (BID) | ORAL | 0 refills | Status: AC
  Filled 2024-05-24: qty 180, 90d supply, fill #0

## 2024-05-27 ENCOUNTER — Other Ambulatory Visit (HOSPITAL_COMMUNITY): Payer: Self-pay

## 2025-03-25 ENCOUNTER — Ambulatory Visit
